# Patient Record
Sex: Male | Born: 1945 | Race: White | Hispanic: No | Smoking: Current every day smoker
Health system: Southern US, Community
[De-identification: ages and names within clinical notes are randomized; demographics above are authoritative.]

## PROBLEM LIST (undated history)

## (undated) DIAGNOSIS — D492 Neoplasm of unspecified behavior of bone, soft tissue, and skin: Secondary | ICD-10-CM

## (undated) DIAGNOSIS — K469 Unspecified abdominal hernia without obstruction or gangrene: Secondary | ICD-10-CM

## (undated) DIAGNOSIS — N19 Unspecified kidney failure: Secondary | ICD-10-CM

## (undated) DIAGNOSIS — F419 Anxiety disorder, unspecified: Secondary | ICD-10-CM

## (undated) DIAGNOSIS — F1010 Alcohol abuse, uncomplicated: Secondary | ICD-10-CM

## (undated) DIAGNOSIS — Z21 Asymptomatic human immunodeficiency virus [HIV] infection status: Secondary | ICD-10-CM

## (undated) DIAGNOSIS — B20 Human immunodeficiency virus [HIV] disease: Secondary | ICD-10-CM

## (undated) DIAGNOSIS — I10 Essential (primary) hypertension: Secondary | ICD-10-CM

## (undated) DIAGNOSIS — R55 Syncope and collapse: Secondary | ICD-10-CM

## (undated) DIAGNOSIS — K4090 Unilateral inguinal hernia, without obstruction or gangrene, not specified as recurrent: Secondary | ICD-10-CM

## (undated) HISTORY — DX: Unspecified kidney failure: N19

## (undated) HISTORY — DX: Syncope and collapse: R55

## (undated) HISTORY — DX: Unspecified abdominal hernia without obstruction or gangrene: K46.9

## (undated) HISTORY — PX: CARPAL TUNNEL RELEASE: SHX101

## (undated) HISTORY — DX: Unilateral inguinal hernia, without obstruction or gangrene, not specified as recurrent: K40.90

## (undated) HISTORY — DX: Human immunodeficiency virus (HIV) disease: B20

## (undated) HISTORY — PX: JOINT REPLACEMENT: SHX530

## (undated) HISTORY — DX: Anxiety disorder, unspecified: F41.9

## (undated) HISTORY — PX: REPLACEMENT TOTAL KNEE: SUR1224

## (undated) HISTORY — DX: Asymptomatic human immunodeficiency virus (hiv) infection status: Z21

## (undated) HISTORY — PX: LAMINECTOMY: SHX219

## (undated) HISTORY — DX: Essential (primary) hypertension: I10

## (undated) HISTORY — DX: Neoplasm of unspecified behavior of bone, soft tissue, and skin: D49.2

---

## 1991-03-05 HISTORY — PX: INGUINAL HERNIA REPAIR: SUR1180

## 1994-03-04 HISTORY — PX: INGUINAL HERNIA REPAIR: SUR1180

## 1995-03-05 HISTORY — PX: UMBILICAL HERNIA REPAIR: SHX196

## 1995-03-05 HISTORY — PX: HERNIA REPAIR: SHX51

## 2003-03-05 HISTORY — PX: HERNIA REPAIR: SHX51

## 2013-05-21 ENCOUNTER — Ambulatory Visit: Payer: Self-pay | Admitting: Orthopedic Surgery

## 2013-07-05 ENCOUNTER — Ambulatory Visit: Payer: Self-pay | Admitting: Family Medicine

## 2013-07-30 DIAGNOSIS — M7521 Bicipital tendinitis, right shoulder: Secondary | ICD-10-CM | POA: Insufficient documentation

## 2013-07-30 DIAGNOSIS — M75102 Unspecified rotator cuff tear or rupture of left shoulder, not specified as traumatic: Secondary | ICD-10-CM | POA: Insufficient documentation

## 2013-08-02 DIAGNOSIS — B20 Human immunodeficiency virus [HIV] disease: Secondary | ICD-10-CM | POA: Insufficient documentation

## 2013-08-02 DIAGNOSIS — K219 Gastro-esophageal reflux disease without esophagitis: Secondary | ICD-10-CM | POA: Insufficient documentation

## 2013-08-02 HISTORY — PX: OPEN ANTERIOR SHOULDER RECONSTRUCTION: SHX2100

## 2013-08-19 DIAGNOSIS — M7541 Impingement syndrome of right shoulder: Secondary | ICD-10-CM | POA: Insufficient documentation

## 2013-12-13 DIAGNOSIS — M179 Osteoarthritis of knee, unspecified: Secondary | ICD-10-CM | POA: Insufficient documentation

## 2013-12-13 DIAGNOSIS — M23222 Derangement of posterior horn of medial meniscus due to old tear or injury, left knee: Secondary | ICD-10-CM | POA: Insufficient documentation

## 2013-12-13 DIAGNOSIS — M1712 Unilateral primary osteoarthritis, left knee: Secondary | ICD-10-CM | POA: Insufficient documentation

## 2013-12-13 DIAGNOSIS — M1710 Unilateral primary osteoarthritis, unspecified knee: Secondary | ICD-10-CM | POA: Insufficient documentation

## 2014-02-15 DIAGNOSIS — F329 Major depressive disorder, single episode, unspecified: Secondary | ICD-10-CM | POA: Insufficient documentation

## 2014-02-15 DIAGNOSIS — F418 Other specified anxiety disorders: Secondary | ICD-10-CM | POA: Insufficient documentation

## 2014-02-15 DIAGNOSIS — F32A Depression, unspecified: Secondary | ICD-10-CM | POA: Insufficient documentation

## 2014-02-15 DIAGNOSIS — F419 Anxiety disorder, unspecified: Secondary | ICD-10-CM

## 2014-02-15 DIAGNOSIS — Z8619 Personal history of other infectious and parasitic diseases: Secondary | ICD-10-CM | POA: Insufficient documentation

## 2014-02-17 DIAGNOSIS — R410 Disorientation, unspecified: Secondary | ICD-10-CM | POA: Insufficient documentation

## 2014-02-17 DIAGNOSIS — D72829 Elevated white blood cell count, unspecified: Secondary | ICD-10-CM | POA: Insufficient documentation

## 2014-02-17 DIAGNOSIS — N179 Acute kidney failure, unspecified: Secondary | ICD-10-CM | POA: Insufficient documentation

## 2014-02-17 DIAGNOSIS — I959 Hypotension, unspecified: Secondary | ICD-10-CM | POA: Insufficient documentation

## 2014-02-17 DIAGNOSIS — R031 Nonspecific low blood-pressure reading: Secondary | ICD-10-CM | POA: Insufficient documentation

## 2014-03-04 ENCOUNTER — Emergency Department: Payer: Self-pay | Admitting: Emergency Medicine

## 2014-03-11 DIAGNOSIS — M545 Low back pain, unspecified: Secondary | ICD-10-CM | POA: Insufficient documentation

## 2014-03-15 ENCOUNTER — Emergency Department: Payer: Self-pay | Admitting: Emergency Medicine

## 2014-04-18 DIAGNOSIS — Z96659 Presence of unspecified artificial knee joint: Secondary | ICD-10-CM | POA: Insufficient documentation

## 2014-04-25 DIAGNOSIS — M4716 Other spondylosis with myelopathy, lumbar region: Secondary | ICD-10-CM | POA: Insufficient documentation

## 2014-04-25 DIAGNOSIS — M47816 Spondylosis without myelopathy or radiculopathy, lumbar region: Secondary | ICD-10-CM | POA: Insufficient documentation

## 2014-04-27 ENCOUNTER — Ambulatory Visit: Payer: Self-pay | Admitting: Internal Medicine

## 2014-04-30 ENCOUNTER — Other Ambulatory Visit: Payer: Self-pay | Admitting: Internal Medicine

## 2014-05-03 ENCOUNTER — Ambulatory Visit
Admit: 2014-05-03 | Disposition: A | Discharge status: Home / Self Care | Payer: Self-pay | Attending: Internal Medicine | Admitting: Internal Medicine

## 2014-05-30 LAB — HEPATIC FUNCTION PANEL A (ARMC)
Albumin: 4.1 g/dL
Alkaline Phosphatase: 171 U/L — ABNORMAL HIGH
Bilirubin, Direct: 0.3 mg/dL
Bilirubin,Total: 2.8 mg/dL — ABNORMAL HIGH
Indirect Bilirubin: 2.5 — ABNORMAL HIGH
SGOT(AST): 18 U/L
SGPT (ALT): 10 U/L — ABNORMAL LOW
Total Protein: 6.9 g/dL

## 2014-05-30 LAB — CREATININE, SERUM
Creatinine: 1.91 mg/dL — ABNORMAL HIGH
EGFR (African American): 41 — ABNORMAL LOW
EGFR (Non-African Amer.): 36 — ABNORMAL LOW

## 2014-05-31 LAB — KAPPA/LAMBDA FREE LIGHT CHAINS (ARMC)

## 2014-06-03 ENCOUNTER — Ambulatory Visit
Admit: 2014-06-03 | Disposition: A | Discharge status: Home / Self Care | Payer: Self-pay | Attending: Internal Medicine | Admitting: Internal Medicine

## 2014-06-03 LAB — PROT IMMUNOELECT,UR-24HR

## 2014-06-13 LAB — CBC CANCER CENTER
Basophil #: 0 x10 3/mm (ref 0.0–0.1)
Basophil %: 0.4 %
Eosinophil #: 0.1 x10 3/mm (ref 0.0–0.7)
Eosinophil %: 2 %
HCT: 35.9 % — ABNORMAL LOW (ref 40.0–52.0)
HGB: 12.4 g/dL — ABNORMAL LOW (ref 13.0–18.0)
Lymphocyte #: 1.9 x10 3/mm (ref 1.0–3.6)
Lymphocyte %: 27.4 %
MCH: 35.2 pg — ABNORMAL HIGH (ref 26.0–34.0)
MCHC: 34.5 g/dL (ref 32.0–36.0)
MCV: 102 fL — ABNORMAL HIGH (ref 80–100)
Monocyte #: 0.6 x10 3/mm (ref 0.2–1.0)
Monocyte %: 9 %
Neutrophil #: 4.2 x10 3/mm (ref 1.4–6.5)
Neutrophil %: 61.2 %
Platelet: 164 x10 3/mm (ref 150–440)
RBC: 3.51 10*6/uL — ABNORMAL LOW (ref 4.40–5.90)
RDW: 13.2 % (ref 11.5–14.5)
WBC: 6.9 x10 3/mm (ref 3.8–10.6)

## 2014-06-13 LAB — BILIRUBIN, DIRECT: Bilirubin, Direct: 0.4 mg/dL

## 2014-06-13 LAB — BILIRUBIN, TOTAL: Bilirubin,Total: 2.1 mg/dL — ABNORMAL HIGH

## 2014-06-17 LAB — PROT IMMUNOELECT,UR-24HR

## 2014-07-26 ENCOUNTER — Other Ambulatory Visit: Payer: Self-pay | Admitting: *Deleted

## 2014-07-26 DIAGNOSIS — D509 Iron deficiency anemia, unspecified: Secondary | ICD-10-CM

## 2014-07-26 DIAGNOSIS — R809 Proteinuria, unspecified: Secondary | ICD-10-CM

## 2014-07-26 DIAGNOSIS — R7989 Other specified abnormal findings of blood chemistry: Secondary | ICD-10-CM

## 2014-07-27 ENCOUNTER — Inpatient Hospital Stay: Payer: Medicare Other | Attending: Internal Medicine | Admitting: Internal Medicine

## 2014-07-27 ENCOUNTER — Other Ambulatory Visit: Payer: Medicare Other

## 2014-08-17 ENCOUNTER — Ambulatory Visit: Payer: Medicare Other | Admitting: Family Medicine

## 2014-08-17 ENCOUNTER — Other Ambulatory Visit: Payer: Medicare Other

## 2014-08-23 ENCOUNTER — Other Ambulatory Visit: Payer: Self-pay

## 2014-08-24 ENCOUNTER — Inpatient Hospital Stay: Payer: Medicare Other

## 2014-08-24 ENCOUNTER — Inpatient Hospital Stay: Payer: Medicare Other | Attending: Internal Medicine | Admitting: Internal Medicine

## 2014-08-24 VITALS — BP 173/99 | HR 75 | Temp 97.4°F | Resp 18 | Ht 70.0 in | Wt 186.3 lb

## 2014-08-24 DIAGNOSIS — D649 Anemia, unspecified: Secondary | ICD-10-CM | POA: Insufficient documentation

## 2014-08-24 DIAGNOSIS — Z21 Asymptomatic human immunodeficiency virus [HIV] infection status: Secondary | ICD-10-CM | POA: Insufficient documentation

## 2014-08-24 DIAGNOSIS — R809 Proteinuria, unspecified: Secondary | ICD-10-CM

## 2014-08-24 DIAGNOSIS — D472 Monoclonal gammopathy: Secondary | ICD-10-CM | POA: Diagnosis not present

## 2014-08-24 DIAGNOSIS — Z79899 Other long term (current) drug therapy: Secondary | ICD-10-CM

## 2014-08-24 DIAGNOSIS — R7989 Other specified abnormal findings of blood chemistry: Secondary | ICD-10-CM

## 2014-08-24 DIAGNOSIS — D509 Iron deficiency anemia, unspecified: Secondary | ICD-10-CM

## 2014-08-24 LAB — IRON AND TIBC
Iron: 151 ug/dL (ref 45–182)
Saturation Ratios: 40 % — ABNORMAL HIGH (ref 17.9–39.5)
TIBC: 379 ug/dL (ref 250–450)
UIBC: 228 ug/dL

## 2014-08-24 LAB — HEPATIC FUNCTION PANEL
ALT: 16 U/L — ABNORMAL LOW (ref 17–63)
AST: 23 U/L (ref 15–41)
Albumin: 4.5 g/dL (ref 3.5–5.0)
Alkaline Phosphatase: 138 U/L — ABNORMAL HIGH (ref 38–126)
Bilirubin, Direct: 0.3 mg/dL (ref 0.1–0.5)
Indirect Bilirubin: 2.5 mg/dL — ABNORMAL HIGH (ref 0.3–0.9)
Total Bilirubin: 2.8 mg/dL — ABNORMAL HIGH (ref 0.3–1.2)
Total Protein: 7.2 g/dL (ref 6.5–8.1)

## 2014-08-24 LAB — CBC WITH DIFFERENTIAL/PLATELET
Basophils Absolute: 0 10*3/uL (ref 0–0.1)
Basophils Relative: 0 %
Eosinophils Absolute: 0.1 10*3/uL (ref 0–0.7)
Eosinophils Relative: 1 %
HCT: 39.3 % — ABNORMAL LOW (ref 40.0–52.0)
Hemoglobin: 13.4 g/dL (ref 13.0–18.0)
Lymphocytes Relative: 19 %
Lymphs Abs: 1.6 10*3/uL (ref 1.0–3.6)
MCH: 34.9 pg — ABNORMAL HIGH (ref 26.0–34.0)
MCHC: 34 g/dL (ref 32.0–36.0)
MCV: 102.4 fL — ABNORMAL HIGH (ref 80.0–100.0)
Monocytes Absolute: 0.8 10*3/uL (ref 0.2–1.0)
Monocytes Relative: 9 %
Neutro Abs: 5.8 10*3/uL (ref 1.4–6.5)
Neutrophils Relative %: 71 %
Platelets: 175 10*3/uL (ref 150–440)
RBC: 3.84 MIL/uL — ABNORMAL LOW (ref 4.40–5.90)
RDW: 13.6 % (ref 11.5–14.5)
WBC: 8.3 10*3/uL (ref 3.8–10.6)

## 2014-08-24 LAB — FERRITIN: Ferritin: 287 ng/mL (ref 24–336)

## 2014-09-05 NOTE — Progress Notes (Signed)
[place]  [phone] [phone]     ID: Tennis Must. OB: 1945/05/14  MR#: [id number]  JYN#:[id number]  No care team member to display  CHIEF COMPLAINT/DIAGNOSIS:  1. Positive low level UPEP, DETECTED DURING W/U OF AZOTEMIA, PROTEINURIA.  ON 04/13/14, SPEP NO SPIKE, CRE 1.7, HGB 11.1, PLTS, WBC, DIFF, NORMAL URINE PROTEIN 167MG /DL, 22% M SPIKE, OR 33MG /DL OF M PROTEIN    April 2016 - 24-hour UIEP negative for M-spike, showed total 464 mg protein. Feb 2016 - SIEP negative for M-spike, serum B12 normal. Mar 2016 - Serum kappa LC 25.78, lambda LC 16.93, kappa/lambda ratio 1.52, stool OB negative x 2, serum iron 61, iron sat 20%. 2. HIV POSITIVE, ON TX, VIRAL LOAD UNDECTECTABLE, ALSO HX HEP , HAS POS ANTIBODY SCREEN, REPORTS PRIOR SUCCESSFULL TX   3. UNCONFIRMED, HE RECALLS BEING TOLD B12 LOW AT ONE TIME, BUT B12 LEVEL NORMAL 2/24   4. ANEMIA, LIKELY AZOTEMIA, COOMBS NEG ALSO DRUG EFFECT. BORDERLINE LOW IRON SAT.   HISTORY OF PRESENT ILLNESS:  Patient returns for continued hematology followup, last seen about 3-4 months ago. Seen by me in absence of Dr. Inez Pilgrim. States he is doing about the same. Eating steady, denies weight loss. Denies fevers or night sweats. Denies feeling lymph nodes on self-exam. Has chronic low back pain, also chronic knee and shoulder pain issues are the same, fluctuates 2 to 8/10. States he has tingling in both hands intermittently and is planned for MRI neck evaluation. Denies new bone pains. No new headaches, focal weakness or paresthesias in extremities.  REVIEW OF SYSTEMS:   ROS As in HPI above. In addition, no fever, chills or sweats. No new headaches or focal weakness.  No new sore throator dysphagia. No new abdominal pain, constipation, diarrhea, dysuria or hematuria. No new skin rash or bleeding symptoms. No new paresthesias in extremities. No polyuria polydipsia.  PAST MEDICAL HISTORY: Reviewed. PVD GERD LUMBAR  LAMINECTOMY Kidney Stones:    Abnormal Heart Rhythm:    Depression:    Anxiety:    Arthritis:    HIV:    Right and Left Knee Replacement:    Hernia Repair: x2   Leg Surgery - Right:  PAST SURGICAL HISTORY: Reviewed. As above  FAMILY HISTORY: Reviewed. Negative for hematological disorders.  ADVANCED DIRECTIVES:  <no information>  SOCIAL HISTORY: Reviewed. Ex-smoker, quit in Christmas of 2014 after smoking for 15 years. Has h/o alcohol intake, 1-2 drinks per day. Prior usage of marijuana and cocaine.   No Known Allergies  No current outpatient prescriptions on file.   No current facility-administered medications for this visit.    PHYSICAL EXAM: Filed Vitals:   08/24/14 1131  BP: 173/99  Pulse:   Temp:   Resp:      Body mass index is 26.73 kg/(m^2).       GENERAL: Patient is alert and oriented and in no acute distress. There is no icterus. HEENT: EOMs intact. Oral exam negative for thrush or lesions. No cervical lymphadenopathy. LUNGS: Bilaterally clear to auscultation, no rhonchi. ABDOMEN: Soft, nontender. No hepatosplenomegaly clinically.  EXTREMITIES: No pedal edema. LYMPHATICS: No palpable adenopathy in axillary or inguinal areas.   LAB RESULTS:    Component Value Date/Time   CREATININE 1.91* 05/30/2014 1001   PROT 7.2 08/24/2014 1044   PROT 6.9 05/30/2014 1001   ALBUMIN 4.5 08/24/2014 1044   ALBUMIN 4.1 05/30/2014 1001   AST 23 08/24/2014 1044   AST 18 05/30/2014 1001   ALT  16* 08/24/2014 1044   ALT 10* 05/30/2014 1001   ALKPHOS 138* 08/24/2014 1044   ALKPHOS 171* 05/30/2014 1001   BILITOT 2.8* 08/24/2014 1044   GFRNONAA 36* 05/30/2014 1001   GFRAA 41* 05/30/2014 1001   Lab Results  Component Value Date   WBC 8.3 08/24/2014   NEUTROABS 5.8 08/24/2014   HGB 13.4 08/24/2014   HCT 39.3* 08/24/2014   MCV 102.4* 08/24/2014   PLT 175 08/24/2014    STUDIES: April 2016 - 24-hour UIEP negative for M-spike, showed total 464 mg protein. Feb  2016 - SIEP negative for M-spike, serum B12 normal. Mar 2016 - Serum kappa LC 25.78, lambda LC 16.93, kappa/lambda ratio 1.52, stool OB negative x 2, serum iron 61, iron sat 20%.   ASSESSMENT / PLAN:   1. Positive low level UPEP, DETECTED DURING W/U OF AZOTEMIA, PROTEINURIA.  ON 04/13/14, SPEP NO SPIKE, CRE 1.7, HGB 11.1, PLTS, WBC, DIFF, NORMAL URINE PROTEIN 167MG /DL, 22% M SPIKE, OR 33MG /DL OF M PROTEIN. April 2016 - 24-hour UIEP negative for M-spike, showed total 464 mg protein. Feb 2016 - SIEP negative for M-spike, serum B12 normal. Mar 2016 - Serum kappa LC 25.78, lambda LC 16.93, kappa/lambda ratio 1.52, stool OB negative x 2, serum iron 61, iron sat 20% -  Reviewed recent labs and d/w patient. Recent SIEP and 24-hour UIEP negative for M-protein. 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 Plan is continued monitoring, will get Lab at 24 weeks, next MD f/u at 48 weeks with labs and make further plan of management.. 2. HIV POSITIVE, ON TX, VIRAL LOAD UNDECTECTABLE, ALSO HX HEP , HAS POS ANTIBODY SCREEN - states he is following with ID expert. 3. ANEMIA, LIKELY AZOTEMIA, COOMBS NEG ALSO DRUG EFFECT. BORDERLINE LOW IRON SAT - recent w/u earlier this year mostly unremarkable. Hb normalized today, will monitor.  4.In between visits, patient advised to call or come to ER in case of any fevers, bleeding, new symptoms or acute sickness. He is agreeable to this plan.     Leia Alf, MD   09/05/2014 2:23 PM

## 2014-09-15 ENCOUNTER — Other Ambulatory Visit: Payer: Self-pay | Admitting: Family Medicine

## 2014-09-15 DIAGNOSIS — M79642 Pain in left hand: Secondary | ICD-10-CM

## 2014-09-21 ENCOUNTER — Ambulatory Visit: Payer: Medicare Other

## 2014-09-23 ENCOUNTER — Telehealth (HOSPITAL_COMMUNITY): Payer: Self-pay | Admitting: Family Medicine

## 2014-09-26 ENCOUNTER — Ambulatory Visit: Payer: Medicare Other

## 2014-10-03 ENCOUNTER — Ambulatory Visit
Admission: RE | Admit: 2014-10-03 | Discharge: 2014-10-03 | Disposition: A | Discharge status: Home / Self Care | Payer: Medicare Other | Source: Ambulatory Visit | Attending: Family Medicine | Admitting: Family Medicine

## 2014-10-03 DIAGNOSIS — Z87898 Personal history of other specified conditions: Secondary | ICD-10-CM | POA: Insufficient documentation

## 2014-10-03 DIAGNOSIS — M4802 Spinal stenosis, cervical region: Secondary | ICD-10-CM | POA: Diagnosis not present

## 2014-10-03 DIAGNOSIS — Z85828 Personal history of other malignant neoplasm of skin: Secondary | ICD-10-CM | POA: Insufficient documentation

## 2014-10-03 DIAGNOSIS — M79642 Pain in left hand: Secondary | ICD-10-CM

## 2014-10-03 DIAGNOSIS — G542 Cervical root disorders, not elsewhere classified: Secondary | ICD-10-CM | POA: Insufficient documentation

## 2015-01-16 ENCOUNTER — Telehealth: Payer: Self-pay

## 2015-01-16 NOTE — Telephone Encounter (Signed)
Patient walked in this am and states that he was just seen by Dr. Mack Guise (Ortho) due to right hip pain and he told him it was due to a right inguinal hernia that the patient reports having more than a year. He would like to get this fixed. His PCP is [facility]. I informed patient that I would need to get records sent over from PCP and then we would be happy to place him on the schedule.   I will call patient to schedule him when records have arrived. (Patient's Phone - [phone])  Called Dr. Hardin Negus office. [phone]. [facility]). Both voicemail boxes reached at clinic were full. Will try back.

## 2015-01-17 NOTE — Telephone Encounter (Signed)
Called Dr. Hardin Negus office once again. Spoke with Benjamine Mola and asked for records. She will fax over records. As soon as I get records, I can call patient to schedule.

## 2015-01-18 ENCOUNTER — Other Ambulatory Visit: Payer: Self-pay | Admitting: Orthopedic Surgery

## 2015-01-18 DIAGNOSIS — M5416 Radiculopathy, lumbar region: Secondary | ICD-10-CM

## 2015-01-19 NOTE — Telephone Encounter (Signed)
Right Inguinal Hernia repaired with mesh in [place], [place] 30 years ago, Umbilical Hernia was repaired in 1997 in [facility], [place]. Supraumbilical hernia repair in 2005, [place].  Left lower abdomen ventral Hernia repaired [place], [place] 1997. Left inguinal hernia repair 1996 [place], [place].   Currently has a ventral hernia that is asymptomatic at this time, has had 5 years- never been a problem.   Right inguinal hernia has recurred 6 months ago and patient is now having increased pain.  Scheduled for MRI on Right Hip on 02/08/15.

## 2015-01-19 NOTE — Telephone Encounter (Signed)
Called patient at this time to discuss history received. Pt has history of umbilical and inguinal hernia repair. Need more information on these surgeries prior to scheduling with a surgeon. No answer when called. Will await a call back.

## 2015-02-01 ENCOUNTER — Encounter: Payer: Self-pay | Admitting: Surgery

## 2015-02-01 ENCOUNTER — Ambulatory Visit (INDEPENDENT_AMBULATORY_CARE_PROVIDER_SITE_OTHER): Payer: Medicare Other | Admitting: Surgery

## 2015-02-01 VITALS — BP 149/93 | HR 80 | Temp 98.1°F | Ht 70.0 in | Wt 195.0 lb

## 2015-02-01 DIAGNOSIS — K4090 Unilateral inguinal hernia, without obstruction or gangrene, not specified as recurrent: Secondary | ICD-10-CM | POA: Diagnosis not present

## 2015-02-01 DIAGNOSIS — M25551 Pain in right hip: Secondary | ICD-10-CM | POA: Insufficient documentation

## 2015-02-01 NOTE — Patient Instructions (Signed)
For your pelvic pain you will need to put ice packs and take Ibuprofen or Tylenol. You need to rest. Can not work until you feel better. This will take 12 weeks until you feel better.  After 12 weeks of resting and you have pain on your groin are, please give [place] a call so we could see you and evaluate you again.

## 2015-02-01 NOTE — Progress Notes (Signed)
Subjective:     Patient ID: Anthony Must., male   DOB: 04-17-1945, 69 y.o.   MRN: [id number]  HPI  69 yr old with Hx of umbilical, ventral, and bilateral inguinal hernia repairs years prior as well as arthritis and hips pain comes in with concern for possible recurrent right inguinal hernia.  Patient states that a few weeks ago he was lifting a ladder around and had severe pain in the right groin afterward.  He states that he took it easy for a few days and pain improved but states once going back to activities he has been hurting again.  He states it hurts to walk, and that the pain radiates down to just above the knee.  He denies any fever, chills, nausea, vomiting, SOB, chest pain, abdominal pain, constipation, diarrhea or dysuria.   Past Medical History  Diagnosis Date  . HIV infection ([place])     Sees Dr. Ola Spurr for this  . Hernia, abdominal   . Inguinal hernia   . Kidney failure   . Neoplasm of skin    Past Surgical History  Procedure Laterality Date  . Laminectomy    . Replacement total knee Right   . Replacement total knee Left     [facility]  . Umbilical hernia repair  1997    [place], [place]  . Open anterior shoulder reconstruction Right 08/2013    [facility]  . Carpal tunnel release Left   . Inguinal hernia repair Left 1996    [place]  . Inguinal hernia repair Right 1993    [place]  . Hernia repair  1997    Left Lower Abdomen- [place]  . Hernia repair  [id number]    Supraumbilical- [place]   Family History  Problem Relation Age of Onset  . Diabetes Mother   . CAD Father 76   Social History   Social History  . Marital Status: Single    Spouse Name: N/A  . Number of Children: N/A  . Years of Education: N/A   Social History Main Topics  . Smoking status: Former Smoker    Quit date: 06/18/2013  . Smokeless tobacco: Never Used  . Alcohol Use: 0.0 oz/week    0 Standard drinks or equivalent per week     Comment: Every other day  . Drug Use: No     Comment: Use  to do cocaine 30 years ago  . Sexual Activity: Not Asked   Other Topics Concern  . None   Social History Narrative    Current outpatient prescriptions:  .  abacavir (ZIAGEN) 300 MG tablet, Take 1 tablet by mouth 2 (two) times daily., Disp: , Rfl:  .  acetaminophen (TYLENOL) 500 MG tablet, Take 1,000 mg by mouth every 6 (six) hours as needed., Disp: , Rfl:  .  atazanavir (REYATAZ) 200 MG capsule, Take 1 capsule by mouth 2 (two) times daily., Disp: , Rfl:  .  buPROPion (WELLBUTRIN SR) 150 MG 12 hr tablet, Take 1 tablet by mouth 2 (two) times daily., Disp: , Rfl:  .  clindamycin (CLEOCIN T) 1 % lotion, Apply 1 application topically 1 day or 1 dose., Disp: , Rfl:  .  DESCOVY 200-25 MG tablet, Take 1 tablet by mouth 1 day or 1 dose., Disp: , Rfl:  .  diclofenac sodium (VOLTAREN) 1 % GEL, Apply 2 g topically 4 (four) times daily., Disp: , Rfl:  .  Diphenhyd-Lidocaine-Nystatin (FIRST-BXN MOUTHWASH) SUSP, Use as directed 10 mLs in the mouth  or throat every 6 (six) hours as needed., Disp: , Rfl:  .  ketoconazole (NIZORAL) 2 % shampoo, Apply 1 application topically 1 day or 1 dose., Disp: , Rfl:  .  lamivudine (EPIVIR) 100 MG tablet, Take 100 mg by mouth daily., Disp: , Rfl:  .  ranitidine (ZANTAC) 150 MG capsule, Take 150 mg by mouth 2 (two) times daily., Disp: , Rfl:  No Known Allergies     Review of Systems  Constitutional: Negative for fever, chills, activity change and fatigue.  HENT: Negative for congestion and nosebleeds.   Respiratory: Negative for cough, chest tightness, shortness of breath and wheezing.   Cardiovascular: Negative for chest pain, palpitations and leg swelling.  Gastrointestinal: Negative for nausea, vomiting, abdominal pain, diarrhea, constipation and blood in stool.       Right groin pain  Genitourinary: Negative for dysuria, hematuria, penile pain and testicular pain.  Musculoskeletal: Positive for back pain, joint swelling and gait problem.  Skin: Negative for  color change, pallor, rash and wound.  Neurological: Negative for dizziness, seizures and weakness.  Hematological: Negative for adenopathy. Does not bruise/bleed easily.  Psychiatric/Behavioral: Negative for agitation. The patient is not nervous/anxious.   All other systems reviewed and are negative.      Objective:   Physical Exam  Constitutional: He is oriented to person, place, and time. He appears well-developed and well-nourished. No distress.  HENT:  Head: Normocephalic and atraumatic.  Right Ear: External ear normal.  Left Ear: External ear normal.  Nose: Nose normal.  Mouth/Throat: Oropharynx is clear and moist. No oropharyngeal exudate.  Eyes: Conjunctivae are normal. Pupils are equal, round, and reactive to light. No scleral icterus.  Neck: Normal range of motion. Neck supple. No tracheal deviation present.  Cardiovascular: Normal rate, regular rhythm, normal heart sounds and intact distal pulses.  Exam reveals no gallop and no friction rub.   No murmur heard. Pulmonary/Chest: Effort normal and breath sounds normal. No respiratory distress. He has no wheezes. He has no rales.  Abdominal: Soft. Bowel sounds are normal. He exhibits no distension. There is no tenderness. There is no rebound.  Genitourinary: Penis normal. No penile tenderness.  Right groin: small recurrent hernia, no incarceration, intense pain at pubis and along inguinal ligament   Left groin: non-tender, no hernia palpated  Scrotum: normal bilateral skin, no lesions, bilateral testicles present  Musculoskeletal: Normal range of motion. He exhibits tenderness. He exhibits no edema.  Intense pain along right inguinal ligament and pubic bone even with light palpation, as well intense pain with lifting leg but all more lateral than groin  Neurological: He is alert and oriented to person, place, and time.  Skin: Skin is warm and dry. No rash noted. No erythema. No pallor.  Psychiatric: He has a normal mood and  affect. His behavior is normal. Judgment and thought content normal.  Vitals reviewed.      Assessment:     69 yr old with Right inguinal pain and small recurrent hernia     Plan:     Given the level of pain even with very light palpation and more lateral location, appears to musculoskeletal pain from muscle sprain or tear along right inguinal ligament and pubis.  He does have a small hernia but not incarcerated and tenderness more lateral that hernia site.  Instructed that muscular pain continues for about 12 weeks.  That he will need to refrain from strenuous activities, use ice packs to the area multiple times a day and  use tylenol and NSAIDs to decrease the inflammation.  Patient understands.  Will have him return in about 2 months to see if pain resolved and to recheck hernia at that time.

## 2015-02-08 ENCOUNTER — Inpatient Hospital Stay: Payer: Medicare Other | Attending: Internal Medicine

## 2015-02-08 ENCOUNTER — Ambulatory Visit
Admission: RE | Admit: 2015-02-08 | Discharge: 2015-02-08 | Disposition: A | Discharge status: Home / Self Care | Payer: Medicare Other | Source: Ambulatory Visit | Attending: Orthopedic Surgery | Admitting: Orthopedic Surgery

## 2015-02-08 DIAGNOSIS — M47896 Other spondylosis, lumbar region: Secondary | ICD-10-CM | POA: Insufficient documentation

## 2015-02-08 DIAGNOSIS — M4806 Spinal stenosis, lumbar region: Secondary | ICD-10-CM | POA: Diagnosis not present

## 2015-02-08 DIAGNOSIS — D649 Anemia, unspecified: Secondary | ICD-10-CM | POA: Insufficient documentation

## 2015-02-08 DIAGNOSIS — M5416 Radiculopathy, lumbar region: Secondary | ICD-10-CM | POA: Insufficient documentation

## 2015-02-08 DIAGNOSIS — D472 Monoclonal gammopathy: Secondary | ICD-10-CM | POA: Insufficient documentation

## 2015-02-08 LAB — CBC WITH DIFFERENTIAL/PLATELET
Basophils Absolute: 0.1 10*3/uL (ref 0–0.1)
Basophils Relative: 1 %
Eosinophils Absolute: 0.2 10*3/uL (ref 0–0.7)
Eosinophils Relative: 2 %
HCT: 43.3 % (ref 40.0–52.0)
Hemoglobin: 15 g/dL (ref 13.0–18.0)
Lymphocytes Relative: 25 %
Lymphs Abs: 2 10*3/uL (ref 1.0–3.6)
MCH: 34.9 pg — ABNORMAL HIGH (ref 26.0–34.0)
MCHC: 34.6 g/dL (ref 32.0–36.0)
MCV: 100.8 fL — ABNORMAL HIGH (ref 80.0–100.0)
Monocytes Absolute: 0.8 10*3/uL (ref 0.2–1.0)
Monocytes Relative: 9 %
Neutro Abs: 5.2 10*3/uL (ref 1.4–6.5)
Neutrophils Relative %: 63 %
Platelets: 202 10*3/uL (ref 150–440)
RBC: 4.29 MIL/uL — ABNORMAL LOW (ref 4.40–5.90)
RDW: 13.2 % (ref 11.5–14.5)
WBC: 8.2 10*3/uL (ref 3.8–10.6)

## 2015-02-08 LAB — CREATININE, SERUM
Creatinine, Ser: 1.77 mg/dL — ABNORMAL HIGH (ref 0.61–1.24)
GFR calc Af Amer: 44 mL/min — ABNORMAL LOW (ref >60)
GFR calc non Af Amer: 38 mL/min — ABNORMAL LOW (ref >60)

## 2015-02-08 LAB — CALCIUM: Calcium: 8.8 mg/dL — ABNORMAL LOW (ref 8.9–10.3)

## 2015-02-10 LAB — PROTEIN ELECTROPHORESIS, SERUM
A/G Ratio: 1.4 (ref 0.7–1.7)
Albumin ELP: 4 g/dL (ref 2.9–4.4)
Alpha-1-Globulin: 0.2 g/dL (ref 0.0–0.4)
Alpha-2-Globulin: 0.8 g/dL (ref 0.4–1.0)
Beta Globulin: 1.1 g/dL (ref 0.7–1.3)
Gamma Globulin: 0.7 g/dL (ref 0.4–1.8)
Globulin, Total: 2.8 g/dL (ref 2.2–3.9)
Total Protein ELP: 6.8 g/dL (ref 6.0–8.5)

## 2015-03-20 DIAGNOSIS — N9989 Other postprocedural complications and disorders of genitourinary system: Secondary | ICD-10-CM | POA: Insufficient documentation

## 2015-03-20 DIAGNOSIS — L219 Seborrheic dermatitis, unspecified: Secondary | ICD-10-CM | POA: Insufficient documentation

## 2015-03-20 DIAGNOSIS — I10 Essential (primary) hypertension: Secondary | ICD-10-CM | POA: Insufficient documentation

## 2015-03-20 DIAGNOSIS — N289 Disorder of kidney and ureter, unspecified: Secondary | ICD-10-CM | POA: Insufficient documentation

## 2015-03-20 DIAGNOSIS — D7589 Other specified diseases of blood and blood-forming organs: Secondary | ICD-10-CM | POA: Insufficient documentation

## 2015-03-20 DIAGNOSIS — I739 Peripheral vascular disease, unspecified: Secondary | ICD-10-CM | POA: Insufficient documentation

## 2015-03-20 DIAGNOSIS — G629 Polyneuropathy, unspecified: Secondary | ICD-10-CM | POA: Insufficient documentation

## 2015-07-05 ENCOUNTER — Telehealth: Payer: Self-pay | Admitting: *Deleted

## 2015-07-05 DIAGNOSIS — D472 Monoclonal gammopathy: Secondary | ICD-10-CM

## 2015-07-05 NOTE — Telephone Encounter (Signed)
Spoke with Dr. Jacinto Reap. V/o to have pt come this week for labs and will see patient next week for results.  RN spoke with patient. He is agreeable to come tomorrow at 1045 am for labs and he will be placed on md schedule on 07/14/15 at 1330pm for results.  msg sent to cancer center scheduling to arrange these appointment times.  I spoke with Richarda Osmond, coordinator at [facility].  Once hem. Clearance is obtained, she asks that we forward the clearance form/Dr. B's note back to her orthopedic office.

## 2015-07-05 NOTE — Telephone Encounter (Signed)
[facility] is requesting urgent hematology clearance.  Scheduled for R total knee on 08/03/15.  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  Pt has a f/u apt with Dr. Rogue Bussing on 07/26/15.  Form faxed for md signature.  Dr. Jacinto Reap, please advise, do you want to see patient sooner than 5/24.

## 2015-07-06 ENCOUNTER — Inpatient Hospital Stay: Payer: Medicare Other | Attending: Internal Medicine

## 2015-07-06 DIAGNOSIS — Z79899 Other long term (current) drug therapy: Secondary | ICD-10-CM | POA: Diagnosis not present

## 2015-07-06 DIAGNOSIS — D472 Monoclonal gammopathy: Secondary | ICD-10-CM | POA: Diagnosis not present

## 2015-07-06 DIAGNOSIS — D696 Thrombocytopenia, unspecified: Secondary | ICD-10-CM | POA: Insufficient documentation

## 2015-07-06 DIAGNOSIS — Z87891 Personal history of nicotine dependence: Secondary | ICD-10-CM | POA: Insufficient documentation

## 2015-07-06 DIAGNOSIS — N189 Chronic kidney disease, unspecified: Secondary | ICD-10-CM | POA: Diagnosis not present

## 2015-07-06 DIAGNOSIS — B20 Human immunodeficiency virus [HIV] disease: Secondary | ICD-10-CM | POA: Diagnosis not present

## 2015-07-06 LAB — CBC WITH DIFFERENTIAL/PLATELET
Basophils Absolute: 0 10*3/uL (ref 0–0.1)
Basophils Relative: 0 %
Eosinophils Absolute: 0.1 10*3/uL (ref 0–0.7)
Eosinophils Relative: 1 %
HCT: 44.1 % (ref 40.0–52.0)
Hemoglobin: 15.6 g/dL (ref 13.0–18.0)
Lymphocytes Relative: 22 %
Lymphs Abs: 1.8 10*3/uL (ref 1.0–3.6)
MCH: 35.9 pg — ABNORMAL HIGH (ref 26.0–34.0)
MCHC: 35.3 g/dL (ref 32.0–36.0)
MCV: 101.6 fL — ABNORMAL HIGH (ref 80.0–100.0)
Monocytes Absolute: 0.7 10*3/uL (ref 0.2–1.0)
Monocytes Relative: 9 %
Neutro Abs: 5.4 10*3/uL (ref 1.4–6.5)
Neutrophils Relative %: 68 %
Platelets: 147 10*3/uL — ABNORMAL LOW (ref 150–440)
RBC: 4.34 MIL/uL — ABNORMAL LOW (ref 4.40–5.90)
RDW: 14.1 % (ref 11.5–14.5)
WBC: 8.1 10*3/uL (ref 3.8–10.6)

## 2015-07-06 LAB — COMPREHENSIVE METABOLIC PANEL
ALT: 18 U/L (ref 17–63)
AST: 33 U/L (ref 15–41)
Albumin: 4.4 g/dL (ref 3.5–5.0)
Alkaline Phosphatase: 166 U/L — ABNORMAL HIGH (ref 38–126)
Anion gap: 10 (ref 5–15)
BUN: 23 mg/dL — ABNORMAL HIGH (ref 6–20)
CO2: 23 mmol/L (ref 22–32)
Calcium: 9.2 mg/dL (ref 8.9–10.3)
Chloride: 103 mmol/L (ref 101–111)
Creatinine, Ser: 1.95 mg/dL — ABNORMAL HIGH (ref 0.61–1.24)
GFR calc Af Amer: 39 mL/min — ABNORMAL LOW (ref >60)
GFR calc non Af Amer: 34 mL/min — ABNORMAL LOW (ref >60)
Glucose, Bld: 108 mg/dL — ABNORMAL HIGH (ref 65–99)
Potassium: 3.7 mmol/L (ref 3.5–5.1)
Sodium: 136 mmol/L (ref 135–145)
Total Bilirubin: 1.5 mg/dL — ABNORMAL HIGH (ref 0.3–1.2)
Total Protein: 7.2 g/dL (ref 6.5–8.1)

## 2015-07-07 LAB — KAPPA/LAMBDA LIGHT CHAINS
Kappa free light chain: 26.71 mg/L — ABNORMAL HIGH (ref 3.30–19.40)
Kappa, lambda light chain ratio: 1.37 (ref 0.26–1.65)
Lambda free light chains: 19.53 mg/L (ref 5.71–26.30)

## 2015-07-10 LAB — PROTEIN ELECTROPHORESIS, SERUM
A/G Ratio: 1.5 (ref 0.7–1.7)
Albumin ELP: 3.9 g/dL (ref 2.9–4.4)
Alpha-1-Globulin: 0.2 g/dL (ref 0.0–0.4)
Alpha-2-Globulin: 0.7 g/dL (ref 0.4–1.0)
Beta Globulin: 1 g/dL (ref 0.7–1.3)
Gamma Globulin: 0.7 g/dL (ref 0.4–1.8)
Globulin, Total: 2.6 g/dL (ref 2.2–3.9)
Total Protein ELP: 6.5 g/dL (ref 6.0–8.5)

## 2015-07-14 ENCOUNTER — Inpatient Hospital Stay (HOSPITAL_BASED_OUTPATIENT_CLINIC_OR_DEPARTMENT_OTHER): Payer: Medicare Other | Admitting: Internal Medicine

## 2015-07-14 VITALS — BP 129/79 | HR 72 | Temp 96.5°F | Resp 18 | Wt 196.7 lb

## 2015-07-14 DIAGNOSIS — D472 Monoclonal gammopathy: Secondary | ICD-10-CM | POA: Diagnosis not present

## 2015-07-14 DIAGNOSIS — B20 Human immunodeficiency virus [HIV] disease: Secondary | ICD-10-CM

## 2015-07-14 DIAGNOSIS — D696 Thrombocytopenia, unspecified: Secondary | ICD-10-CM

## 2015-07-14 DIAGNOSIS — Z87891 Personal history of nicotine dependence: Secondary | ICD-10-CM

## 2015-07-14 DIAGNOSIS — N189 Chronic kidney disease, unspecified: Secondary | ICD-10-CM

## 2015-07-14 DIAGNOSIS — Z79899 Other long term (current) drug therapy: Secondary | ICD-10-CM

## 2015-07-14 NOTE — Progress Notes (Signed)
[facility] OFFICE PROGRESS NOTE  Patient Care Team: Theotis Burrow, MD as PCP - General (Family Medicine)   SUMMARY OF HEMATOLOGIC/ONCOLOGIC HISTORY:  # FEB 2016- positive low level UPEP;April 2016- 24 h UPEP- Neg; SIEP/ K-L ratio=N  # HIV- undetectable [Dr.Fitzgerald]; Hx HepC [s/p treatment]; CKD [creat 1.7- 1.9]  INTERVAL HISTORY:  This is my first interaction with the patient since I joined the practice September 2016. I reviewed the patient's prior charts/pertinent labs/imaging in detail; findings are summarized above.   70 year old male patient with a history of low level UPEP/MGUS noted in February 2016- currently awaiting a right knee surgery. He has been referred to [place] for hematology clearance.  Patient is chronic joint pains which is not new. Denies any back pain. Denies any unusual weight loss; a loss of appetite. Denies any shortness of breath cough or chest pain.   REVIEW OF SYSTEMS:  A complete 10 point review of system is done which is negative except mentioned above/history of present illness.   PAST MEDICAL HISTORY :  Past Medical History  Diagnosis Date  . HIV infection ([place])     Sees Dr. Ola Spurr for this  . Hernia, abdominal   . Inguinal hernia   . Kidney failure   . Neoplasm of skin     PAST SURGICAL HISTORY :   Past Surgical History  Procedure Laterality Date  . Laminectomy    . Replacement total knee Right   . Replacement total knee Left     [facility]  . Umbilical hernia repair  1997    [place]  . Open anterior shoulder reconstruction Right 08/2013    [facility]  . Carpal tunnel release Left   . Inguinal hernia repair Left 1996    [place]  . Inguinal hernia repair Right 1993    [place]  . Hernia repair  1997    Left Lower Abdomen- [place]  . Hernia repair  [id number]    Supraumbilical- [place]    FAMILY HISTORY :   Family History  Problem Relation Age of Onset  . Diabetes Mother   . CAD Father 27    SOCIAL  HISTORY:   Social History  Substance Use Topics  . Smoking status: Former Smoker    Quit date: 06/18/2013  . Smokeless tobacco: Never Used  . Alcohol Use: 0.0 oz/week    0 Standard drinks or equivalent per week     Comment: Every other day    ALLERGIES:  has No Known Allergies.  MEDICATIONS:  Current Outpatient Prescriptions  Medication Sig Dispense Refill  . atazanavir (REYATAZ) 200 MG capsule Take 1 capsule by mouth 2 (two) times daily.    [place] Kitchen buPROPion (WELLBUTRIN SR) 150 MG 12 hr tablet Take 1 tablet by mouth 2 (two) times daily.    . clindamycin (CLEOCIN T) 1 % lotion Apply 1 application topically 1 day or 1 dose.    . DESCOVY 200-25 MG tablet Take 1 tablet by mouth 1 day or 1 dose.    [place] Kitchen ketoconazole (NIZORAL) 2 % shampoo Apply 1 application topically 1 day or 1 dose.    . ranitidine (ZANTAC) 150 MG capsule Take 150 mg by mouth 2 (two) times daily.    . diclofenac sodium (VOLTAREN) 1 % GEL Apply 2 g topically 4 (four) times daily. Reported on 07/14/2015     No current facility-administered medications for this visit.    PHYSICAL EXAMINATION:   BP 129/79 mmHg  Pulse 72  Temp(Src) 96.5 F (35.8 C) (Tympanic)  Resp 18  Wt 196 lb 10.4 oz (89.2 kg)  Filed Weights   07/14/15 1349  Weight: 196 lb 10.4 oz (89.2 kg)    GENERAL: Well-nourished well-developed; Alert, no distress and comfortable.   Alone. It is walking with a cane. ES: no pallor or icterus OROPHARYNX: no thrush or ulceration; good dentition  NECK: supple, no masses felt LYMPH:  no palpable lymphadenopathy in the cervical, axillary or inguinal regions LUNGS: clear to auscultation and  No wheeze or crackles HEART/CVS: regular rate & rhythm and no murmurs; No lower extremity edema ABDOMEN:abdomen soft, non-tender and normal bowel sounds Musculoskeletal:no cyanosis of digits and no clubbing  PSYCH: alert & oriented x 3 with fluent speech NEURO: no focal motor/sensory deficits SKIN:  no rashes or significant  lesions  LABORATORY DATA:  I have reviewed the data as listed    Component Value Date/Time   NA 136 07/06/2015 1035   K 3.7 07/06/2015 1035   CL 103 07/06/2015 1035   CO2 23 07/06/2015 1035   GLUCOSE 108* 07/06/2015 1035   BUN 23* 07/06/2015 1035   CREATININE 1.95* 07/06/2015 1035   CREATININE 1.91* 05/30/2014 1001   CALCIUM 9.2 07/06/2015 1035   PROT 7.2 07/06/2015 1035   PROT 6.9 05/30/2014 1001   ALBUMIN 4.4 07/06/2015 1035   ALBUMIN 4.1 05/30/2014 1001   AST 33 07/06/2015 1035   AST 18 05/30/2014 1001   ALT 18 07/06/2015 1035   ALT 10* 05/30/2014 1001   ALKPHOS 166* 07/06/2015 1035   ALKPHOS 171* 05/30/2014 1001   BILITOT 1.5* 07/06/2015 1035   BILITOT 2.1* 06/13/2014 1407   GFRNONAA 34* 07/06/2015 1035   GFRNONAA 36* 05/30/2014 1001   GFRAA 39* 07/06/2015 1035   GFRAA 41* 05/30/2014 1001    No results found for: SPEP, UPEP  Lab Results  Component Value Date   WBC 8.1 07/06/2015   NEUTROABS 5.4 07/06/2015   HGB 15.6 07/06/2015   HCT 44.1 07/06/2015   MCV 101.6* 07/06/2015   PLT 147* 07/06/2015      Chemistry      Component Value Date/Time   NA 136 07/06/2015 1035   K 3.7 07/06/2015 1035   CL 103 07/06/2015 1035   CO2 23 07/06/2015 1035   BUN 23* 07/06/2015 1035   CREATININE 1.95* 07/06/2015 1035   CREATININE 1.91* 05/30/2014 1001      Component Value Date/Time   CALCIUM 9.2 07/06/2015 1035   ALKPHOS 166* 07/06/2015 1035   ALKPHOS 171* 05/30/2014 1001   AST 33 07/06/2015 1035   AST 18 05/30/2014 1001   ALT 18 07/06/2015 1035   ALT 10* 05/30/2014 1001   BILITOT 1.5* 07/06/2015 1035   BILITOT 2.1* 06/13/2014 1407        ASSESSMENT & PLAN:   # 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 Repeat workup negative for M spike or urine protein. Today I lambda light chain normal; SPEP negative. White count hemoglobin normal.  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 Unclear etiology. Monitor for now.  # Chronic kidney disease- 1.9.  Patient does not have nephrologist. Defer to PCP.  # Patient is okay from hematology standpoint to proceed with surgery. He'll follow-up with me in approximately one year; and if still no M protein noted/platelets normalized- he could be discharged from the clinic.   All questions were answered. The patient knows to call the clinic with any problems, questions or concerns.      Lenetta Quaker  Ann Lions, MD 07/14/2015 1:56 PM

## 2015-07-26 ENCOUNTER — Ambulatory Visit: Payer: Medicare Other | Admitting: Internal Medicine

## 2015-07-26 ENCOUNTER — Other Ambulatory Visit: Payer: Medicare Other

## 2016-07-12 ENCOUNTER — Inpatient Hospital Stay: Payer: Medicare Other | Attending: Internal Medicine

## 2016-07-12 DIAGNOSIS — D696 Thrombocytopenia, unspecified: Secondary | ICD-10-CM | POA: Insufficient documentation

## 2016-07-12 DIAGNOSIS — B20 Human immunodeficiency virus [HIV] disease: Secondary | ICD-10-CM | POA: Diagnosis not present

## 2016-07-12 DIAGNOSIS — N183 Chronic kidney disease, stage 3 (moderate): Secondary | ICD-10-CM | POA: Diagnosis not present

## 2016-07-12 DIAGNOSIS — Z87891 Personal history of nicotine dependence: Secondary | ICD-10-CM | POA: Insufficient documentation

## 2016-07-12 DIAGNOSIS — Z79899 Other long term (current) drug therapy: Secondary | ICD-10-CM | POA: Insufficient documentation

## 2016-07-12 DIAGNOSIS — D472 Monoclonal gammopathy: Secondary | ICD-10-CM

## 2016-07-12 LAB — CBC WITH DIFFERENTIAL/PLATELET
Basophils Absolute: 0 10*3/uL (ref 0–0.1)
Basophils Relative: 0 %
Eosinophils Absolute: 0.2 10*3/uL (ref 0–0.7)
Eosinophils Relative: 3 %
HCT: 42.5 % (ref 40.0–52.0)
Hemoglobin: 15.1 g/dL (ref 13.0–18.0)
Lymphocytes Relative: 21 %
Lymphs Abs: 1.5 10*3/uL (ref 1.0–3.6)
MCH: 37.6 pg — ABNORMAL HIGH (ref 26.0–34.0)
MCHC: 35.6 g/dL (ref 32.0–36.0)
MCV: 105.6 fL — ABNORMAL HIGH (ref 80.0–100.0)
Monocytes Absolute: 0.7 10*3/uL (ref 0.2–1.0)
Monocytes Relative: 10 %
Neutro Abs: 4.6 10*3/uL (ref 1.4–6.5)
Neutrophils Relative %: 66 %
Platelets: 136 10*3/uL — ABNORMAL LOW (ref 150–440)
RBC: 4.02 MIL/uL — ABNORMAL LOW (ref 4.40–5.90)
RDW: 13.4 % (ref 11.5–14.5)
WBC: 7 10*3/uL (ref 3.8–10.6)

## 2016-07-12 LAB — COMPREHENSIVE METABOLIC PANEL
ALT: 38 U/L (ref 17–63)
AST: 48 U/L — ABNORMAL HIGH (ref 15–41)
Albumin: 4.2 g/dL (ref 3.5–5.0)
Alkaline Phosphatase: 107 U/L (ref 38–126)
Anion gap: 8 (ref 5–15)
BUN: 21 mg/dL — ABNORMAL HIGH (ref 6–20)
CO2: 23 mmol/L (ref 22–32)
Calcium: 9.5 mg/dL (ref 8.9–10.3)
Chloride: 106 mmol/L (ref 101–111)
Creatinine, Ser: 1.88 mg/dL — ABNORMAL HIGH (ref 0.61–1.24)
GFR calc Af Amer: 41 mL/min — ABNORMAL LOW (ref >60)
GFR calc non Af Amer: 35 mL/min — ABNORMAL LOW (ref >60)
Glucose, Bld: 114 mg/dL — ABNORMAL HIGH (ref 65–99)
Potassium: 3.7 mmol/L (ref 3.5–5.1)
Sodium: 137 mmol/L (ref 135–145)
Total Bilirubin: 7.6 mg/dL — ABNORMAL HIGH (ref 0.3–1.2)
Total Protein: 7.1 g/dL (ref 6.5–8.1)

## 2016-07-16 LAB — MULTIPLE MYELOMA PANEL, SERUM
Albumin SerPl Elph-Mcnc: 4.1 g/dL (ref 2.9–4.4)
Albumin/Glob SerPl: 1.8 — ABNORMAL HIGH (ref 0.7–1.7)
Alpha 1: 0.2 g/dL (ref 0.0–0.4)
Alpha2 Glob SerPl Elph-Mcnc: 0.6 g/dL (ref 0.4–1.0)
B-Globulin SerPl Elph-Mcnc: 0.9 g/dL (ref 0.7–1.3)
Gamma Glob SerPl Elph-Mcnc: 0.7 g/dL (ref 0.4–1.8)
Globulin, Total: 2.3 g/dL (ref 2.2–3.9)
IgA: 224 mg/dL (ref 61–437)
IgG (Immunoglobin G), Serum: 714 mg/dL (ref 700–1600)
IgM, Serum: 43 mg/dL (ref 20–172)
Total Protein ELP: 6.4 g/dL (ref 6.0–8.5)

## 2016-07-19 ENCOUNTER — Inpatient Hospital Stay (HOSPITAL_BASED_OUTPATIENT_CLINIC_OR_DEPARTMENT_OTHER): Payer: Medicare Other | Admitting: Internal Medicine

## 2016-07-19 DIAGNOSIS — D472 Monoclonal gammopathy: Secondary | ICD-10-CM

## 2016-07-19 DIAGNOSIS — D696 Thrombocytopenia, unspecified: Secondary | ICD-10-CM

## 2016-07-19 DIAGNOSIS — N183 Chronic kidney disease, stage 3 (moderate): Secondary | ICD-10-CM | POA: Diagnosis not present

## 2016-07-19 DIAGNOSIS — Z79899 Other long term (current) drug therapy: Secondary | ICD-10-CM | POA: Diagnosis not present

## 2016-07-19 DIAGNOSIS — Z87891 Personal history of nicotine dependence: Secondary | ICD-10-CM

## 2016-07-19 DIAGNOSIS — B20 Human immunodeficiency virus [HIV] disease: Secondary | ICD-10-CM

## 2016-07-19 NOTE — Progress Notes (Signed)
Patient here today for follow up.   

## 2016-07-19 NOTE — Progress Notes (Signed)
[place] OFFICE PROGRESS NOTE  Patient Care Team: Revelo, Elyse Jarvis, MD as PCP - General (Family Medicine)   SUMMARY OF HEMATOLOGIC/ONCOLOGIC HISTORY:  # MILD THROMBOCYTOPENIA [may 2017]- 130-140s- monitor for now.  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  Admits to mild easy bruising. Otherwise denies any nosebleeds or gum bleeds.Denies any unusual weight loss; a loss of appetite. Denies any shortness of breath cough or chest pain.   REVIEW OF SYSTEMS:  A complete 10 point review of system is done which is negative except mentioned above/history of present illness.   PAST MEDICAL HISTORY :  Past Medical History:  Diagnosis Date  . Hernia, abdominal   . HIV infection (HCC)    Sees Dr. Ola Spurr for this  . Inguinal hernia   . Kidney failure   . Neoplasm of skin     PAST SURGICAL HISTORY :   Past Surgical History:  Procedure Laterality Date  . CARPAL TUNNEL RELEASE Left   . HERNIA REPAIR  1997   Left Lower Abdomen- [place]  . HERNIA REPAIR  [id number]   [place]  . INGUINAL HERNIA REPAIR Left 1996   [place]  . INGUINAL HERNIA REPAIR Right 1993   [place]  . LAMINECTOMY    . OPEN ANTERIOR SHOULDER RECONSTRUCTION Right 08/2013   [facility]  . REPLACEMENT TOTAL KNEE Right   . REPLACEMENT TOTAL KNEE Left    [facility]  . UMBILICAL HERNIA REPAIR  1997   [place]    FAMILY HISTORY :   Family History  Problem Relation Age of Onset  . Diabetes Mother   . CAD Father 66    SOCIAL HISTORY:   Social History  Substance Use Topics  . Smoking status: Former Smoker    Quit date: 06/18/2013  . Smokeless tobacco:  Never Used  . Alcohol use 0.0 oz/week     Comment: Every other day    ALLERGIES:  has No Known Allergies.  MEDICATIONS:  Current Outpatient Prescriptions  Medication Sig Dispense Refill  . atazanavir (REYATAZ) 200 MG capsule Take 1 capsule by mouth 2 (two) times daily.    Marland Kitchen buPROPion (WELLBUTRIN SR) 150 MG 12 hr tablet Take 1 tablet by mouth 2 (two) times daily.    . clindamycin (CLEOCIN T) 1 % lotion Apply 1 application topically 1 day or 1 dose.    . DESCOVY 200-25 MG tablet Take 1 tablet by mouth 1 day or 1 dose.    . ranitidine (ZANTAC) 150 MG capsule Take 150 mg by mouth 2 (two) times daily.    . diclofenac sodium (VOLTAREN) 1 % GEL Apply 2 g topically 4 (four) times daily. Reported on 07/14/2015     No current facility-administered medications for this visit.     PHYSICAL EXAMINATION:   BP (!) 150/92 (BP Location: Left Arm, Patient Position: Sitting)   Pulse 88   Temp 98 F (36.7 C) (Tympanic)   Resp 16   Wt 194 lb 4 oz (88.1 kg)   BMI 27.87 kg/m   Filed Weights   07/19/16 1453  Weight: 194 lb 4 oz (88.1 kg)    GENERAL: Well-nourished well-developed; Alert, no  distress and comfortable. Alone.  ES: no pallor or icterus OROPHARYNX: no thrush or ulceration; good dentition  NECK: supple, no masses felt LYMPH:  no palpable lymphadenopathy in the cervical, axillary or inguinal regions LUNGS: clear to auscultation and  No wheeze or crackles HEART/CVS: regular rate & rhythm and no murmurs; No lower extremity edema ABDOMEN:abdomen soft, non-tender and normal bowel sounds Musculoskeletal:no cyanosis of digits and no clubbing  PSYCH: alert & oriented x 3 with fluent speech NEURO: no focal motor/sensory deficits SKIN:  no rashes or significant lesions  LABORATORY DATA:  I have reviewed the data as listed    Component Value Date/Time   NA 137 07/12/2016 1410   K 3.7 07/12/2016 1410   CL 106 07/12/2016 1410   CO2 23 07/12/2016 1410   GLUCOSE 114 (H) 07/12/2016 1410    BUN 21 (H) 07/12/2016 1410   CREATININE 1.88 (H) 07/12/2016 1410   CREATININE 1.91 (H) 05/30/2014 1001   CALCIUM 9.5 07/12/2016 1410   PROT 7.1 07/12/2016 1410   PROT 6.9 05/30/2014 1001   ALBUMIN 4.2 07/12/2016 1410   ALBUMIN 4.1 05/30/2014 1001   AST 48 (H) 07/12/2016 1410   AST 18 05/30/2014 1001   ALT 38 07/12/2016 1410   ALT 10 (L) 05/30/2014 1001   ALKPHOS 107 07/12/2016 1410   ALKPHOS 171 (H) 05/30/2014 1001   BILITOT 7.6 (H) 07/12/2016 1410   BILITOT 2.1 (H) 06/13/2014 1407   GFRNONAA 35 (L) 07/12/2016 1410   GFRNONAA 36 (L) 05/30/2014 1001   GFRAA 41 (L) 07/12/2016 1410   GFRAA 41 (L) 05/30/2014 1001    No results found for: SPEP, UPEP  Lab Results  Component Value Date   WBC 7.0 07/12/2016   NEUTROABS 4.6 07/12/2016   HGB 15.1 07/12/2016   HCT 42.5 07/12/2016   MCV 105.6 (H) 07/12/2016   PLT 136 (L) 07/12/2016      Chemistry      Component Value Date/Time   NA 137 07/12/2016 1410   K 3.7 07/12/2016 1410   CL 106 07/12/2016 1410   CO2 23 07/12/2016 1410   BUN 21 (H) 07/12/2016 1410   CREATININE 1.88 (H) 07/12/2016 1410   CREATININE 1.91 (H) 05/30/2014 1001      Component Value Date/Time   CALCIUM 9.5 07/12/2016 1410   ALKPHOS 107 07/12/2016 1410   ALKPHOS 171 (H) 05/30/2014 1001   AST 48 (H) 07/12/2016 1410   AST 18 05/30/2014 1001   ALT 38 07/12/2016 1410   ALT 10 (L) 05/30/2014 1001   BILITOT 7.6 (H) 07/12/2016 1410   BILITOT 2.1 (H) 06/13/2014 1407        ASSESSMENT & PLAN:  Thrombocytopenia ([place]) # 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 Repeat workup negative for M spike or urine protein. Kappa- lambda light chain normal; SPEP negative. White count hemoglobin normal. Repeat labs today/May 2018- normal limits/absent.   # ThromboCytopenia platelets 147 approximately year ago; today they're 136. I do not think this is a cause of patient's easy bruising.   # Chronic kidney disease-III- 1.8 stable. [follows up with Dr.Chen;  Nephrology; [facility]]   # follow-up in 1 year/CBC CMP. Will not monitor M protein.         Cammie Sickle, MD 07/19/2016 5:09 PM

## 2016-07-19 NOTE — Assessment & Plan Note (Deleted)
#   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 Repeat workup negative for M spike or urine protein. Today I lambda light chain normal; SPEP negative. White count hemoglobin normal.  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 Unclear etiology. Monitor for now.  # Chronic kidney disease-III- 1.8 stable.    #

## 2016-07-19 NOTE — Assessment & Plan Note (Signed)
#   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 Repeat workup negative for M spike or urine protein. Kappa- lambda light chain normal; SPEP negative. White count hemoglobin normal. Repeat labs today/May 2018- normal limits/absent.   # ThromboCytopenia platelets 147 approximately year ago; today they're 136. I do not think this is a cause of patient's easy bruising.   # Chronic kidney disease-III- 1.8 stable. [follows up with Dr.Chen; Nephrology; [facility]]   # follow-up in 1 year/CBC CMP. Will not monitor M protein.

## 2016-08-08 ENCOUNTER — Other Ambulatory Visit: Payer: Self-pay | Admitting: Family Medicine

## 2016-08-08 DIAGNOSIS — F1010 Alcohol abuse, uncomplicated: Secondary | ICD-10-CM

## 2016-08-13 ENCOUNTER — Ambulatory Visit: Payer: Medicare Other

## 2016-08-15 ENCOUNTER — Ambulatory Visit
Admission: RE | Admit: 2016-08-15 | Discharge: 2016-08-15 | Disposition: A | Discharge status: Home / Self Care | Payer: Medicare Other | Source: Ambulatory Visit | Attending: Family Medicine | Admitting: Family Medicine

## 2016-08-15 DIAGNOSIS — F1010 Alcohol abuse, uncomplicated: Secondary | ICD-10-CM | POA: Diagnosis not present

## 2016-12-09 ENCOUNTER — Other Ambulatory Visit: Payer: Self-pay | Admitting: Sports Medicine

## 2016-12-09 DIAGNOSIS — M7989 Other specified soft tissue disorders: Secondary | ICD-10-CM

## 2016-12-11 ENCOUNTER — Ambulatory Visit
Admission: RE | Admit: 2016-12-11 | Discharge: 2016-12-11 | Disposition: A | Discharge status: Home / Self Care | Payer: Medicare Other | Source: Ambulatory Visit | Attending: Sports Medicine | Admitting: Sports Medicine

## 2016-12-11 DIAGNOSIS — M7989 Other specified soft tissue disorders: Secondary | ICD-10-CM | POA: Insufficient documentation

## 2017-06-18 ENCOUNTER — Other Ambulatory Visit: Payer: Self-pay | Admitting: Nephrology

## 2017-06-18 DIAGNOSIS — R319 Hematuria, unspecified: Secondary | ICD-10-CM

## 2017-06-23 ENCOUNTER — Ambulatory Visit
Admission: RE | Admit: 2017-06-23 | Discharge: 2017-06-23 | Disposition: A | Discharge status: Home / Self Care | Payer: Medicare Other | Source: Ambulatory Visit | Attending: Nephrology | Admitting: Nephrology

## 2017-06-23 DIAGNOSIS — R319 Hematuria, unspecified: Secondary | ICD-10-CM

## 2017-06-23 DIAGNOSIS — N281 Cyst of kidney, acquired: Secondary | ICD-10-CM | POA: Insufficient documentation

## 2017-06-23 DIAGNOSIS — R93421 Abnormal radiologic findings on diagnostic imaging of right kidney: Secondary | ICD-10-CM | POA: Diagnosis not present

## 2017-07-21 ENCOUNTER — Inpatient Hospital Stay: Payer: Medicare Other

## 2017-07-21 ENCOUNTER — Inpatient Hospital Stay: Payer: Medicare Other | Admitting: Internal Medicine

## 2017-09-01 ENCOUNTER — Inpatient Hospital Stay: Payer: Medicare Other | Attending: Internal Medicine

## 2017-09-01 ENCOUNTER — Encounter: Payer: Self-pay | Admitting: Internal Medicine

## 2017-09-01 ENCOUNTER — Inpatient Hospital Stay (HOSPITAL_BASED_OUTPATIENT_CLINIC_OR_DEPARTMENT_OTHER): Payer: Medicare Other | Admitting: Internal Medicine

## 2017-09-01 VITALS — BP 136/93 | HR 89 | Temp 97.1°F | Resp 16 | Wt 201.8 lb

## 2017-09-01 DIAGNOSIS — F1020 Alcohol dependence, uncomplicated: Secondary | ICD-10-CM | POA: Diagnosis not present

## 2017-09-01 DIAGNOSIS — Z21 Asymptomatic human immunodeficiency virus [HIV] infection status: Secondary | ICD-10-CM | POA: Insufficient documentation

## 2017-09-01 DIAGNOSIS — D472 Monoclonal gammopathy: Secondary | ICD-10-CM | POA: Insufficient documentation

## 2017-09-01 DIAGNOSIS — N183 Chronic kidney disease, stage 3 (moderate): Secondary | ICD-10-CM | POA: Insufficient documentation

## 2017-09-01 DIAGNOSIS — D696 Thrombocytopenia, unspecified: Secondary | ICD-10-CM | POA: Diagnosis present

## 2017-09-01 LAB — CBC WITH DIFFERENTIAL/PLATELET
Basophils Absolute: 0 10*3/uL (ref 0–0.1)
Basophils Relative: 0 %
Eosinophils Absolute: 0.1 10*3/uL (ref 0–0.7)
Eosinophils Relative: 1 %
HCT: 46.2 % (ref 40.0–52.0)
Hemoglobin: 16.2 g/dL (ref 13.0–18.0)
Lymphocytes Relative: 25 %
Lymphs Abs: 2.5 10*3/uL (ref 1.0–3.6)
MCH: 38.7 pg — ABNORMAL HIGH (ref 26.0–34.0)
MCHC: 35.1 g/dL (ref 32.0–36.0)
MCV: 110.2 fL — ABNORMAL HIGH (ref 80.0–100.0)
Monocytes Absolute: 1.1 10*3/uL — ABNORMAL HIGH (ref 0.2–1.0)
Monocytes Relative: 12 %
Neutro Abs: 6.1 10*3/uL (ref 1.4–6.5)
Neutrophils Relative %: 62 %
Platelets: 183 10*3/uL (ref 150–440)
RBC: 4.19 MIL/uL — ABNORMAL LOW (ref 4.40–5.90)
RDW: 13.1 % (ref 11.5–14.5)
WBC: 9.9 10*3/uL (ref 3.8–10.6)

## 2017-09-01 LAB — COMPREHENSIVE METABOLIC PANEL
ALT: 38 U/L (ref 0–44)
AST: 54 U/L — ABNORMAL HIGH (ref 15–41)
Albumin: 4.2 g/dL (ref 3.5–5.0)
Alkaline Phosphatase: 97 U/L (ref 38–126)
Anion gap: 10 (ref 5–15)
BUN: 31 mg/dL — ABNORMAL HIGH (ref 8–23)
CO2: 24 mmol/L (ref 22–32)
Calcium: 9.6 mg/dL (ref 8.9–10.3)
Chloride: 103 mmol/L (ref 98–111)
Creatinine, Ser: 2.76 mg/dL — ABNORMAL HIGH (ref 0.61–1.24)
GFR calc Af Amer: 25 mL/min — ABNORMAL LOW (ref >60)
GFR calc non Af Amer: 22 mL/min — ABNORMAL LOW (ref >60)
Glucose, Bld: 93 mg/dL (ref 70–99)
Potassium: 4.4 mmol/L (ref 3.5–5.1)
Sodium: 137 mmol/L (ref 135–145)
Total Bilirubin: 1.6 mg/dL — ABNORMAL HIGH (ref 0.3–1.2)
Total Protein: 7.2 g/dL (ref 6.5–8.1)

## 2017-09-01 NOTE — Assessment & Plan Note (Addendum)
#   ThromboCytopenia platelets -intermittent.  Today platelets are 183.  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  Asymptomatic.  # Chronic kidney disease-III-BL- 1.8 . [follows up with Dr.Chong; Nephrology; [facility]] ; however creat 2.7 today;WORSE; ? Related to lisinopril.  given copy of labs/ and has repeat Blood work on July 3rd.   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  # Alcoholism- pt concerned; recommend abstinence; recommend AA; pt has been in the past.   # follow-up in 1 year/CBC CMP.

## 2017-09-01 NOTE — Progress Notes (Signed)
[facility] OFFICE PROGRESS NOTE  Patient Care Team: Revelo, Elyse Jarvis, MD as PCP - General (Family Medicine)   SUMMARY OF HEMATOLOGIC/ONCOLOGIC HISTORY:  # MILD THROMBOCYTOPENIA [may 2017]- 130-140s- monitor for now.  # FEB 2016- positive low level UPEP;April 2016- 24 h UPEP- Neg; SIEP/ K-L ratio=N; MAY 2018- NEG M protein; REPEAT June 2019-M PROTEIN- ABSENT  # HIV- undetectable [Dr.Fitzgerald]; Hx HepC [s/p treatment]; CKD [creat 1.7- 1.9]; Hx of Alcohol abuse.   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  Patient denies any significant weakness.  Denies any new lumps or bumps.  Appetite is good.   He states that he was recently started on lisinopril by his nephrologist.   REVIEW OF SYSTEMS:  A complete 10 point review of system is done which is negative except mentioned above/history of present illness.   PAST MEDICAL HISTORY :  Past Medical History:  Diagnosis Date  . Hernia, abdominal   . HIV infection (HCC)    Sees Dr. Ola Spurr for this  . Inguinal hernia   . Kidney failure   . Neoplasm of skin     PAST SURGICAL HISTORY :   Past Surgical History:  Procedure Laterality Date  . CARPAL TUNNEL RELEASE Left   . HERNIA REPAIR  1997   Left Lower Abdomen- [place]  . HERNIA REPAIR  [id number]   [place]  . INGUINAL HERNIA REPAIR Left 1996   [place]  . INGUINAL HERNIA REPAIR Right 1993   [place]  . LAMINECTOMY    . OPEN ANTERIOR SHOULDER RECONSTRUCTION Right 08/2013   [facility]  . REPLACEMENT TOTAL KNEE Right   . REPLACEMENT TOTAL KNEE Left    [facility]  . UMBILICAL HERNIA REPAIR  1997   [place]    FAMILY HISTORY :   Family History  Problem Relation Age of Onset  . Diabetes Mother   . CAD Father 75    SOCIAL HISTORY:   Social History   Tobacco Use  . Smoking status: Former Smoker   Last attempt to quit: 06/18/2013    Years since quitting: 4.2  . Smokeless tobacco: Never Used  Substance Use Topics  . Alcohol use: Yes    Alcohol/week: 0.0 oz    Comment: Every other day  . Drug use: No    Comment: Use to do cocaine 30 years ago    ALLERGIES:  has No Known Allergies.  MEDICATIONS:  Current Outpatient Medications  Medication Sig Dispense Refill  . bictegravir-emtricitabine-tenofovir AF (BIKTARVY) 50-200-25 MG TABS tablet     . cyclobenzaprine (FLEXERIL) 10 MG tablet     . lisinopril (PRINIVIL,ZESTRIL) 10 MG tablet Take by mouth.     No current facility-administered medications for this visit.     PHYSICAL EXAMINATION:   BP (!) 136/93 (BP Location: Left Arm, Patient Position: Sitting)   Pulse 89   Temp (!) 97.1 F (36.2 C) (Tympanic)   Resp 16   Wt 201 lb 12.8 oz (91.5 kg)   BMI 28.96 kg/m   Filed Weights   09/01/17 1433  Weight: 201 lb 12.8 oz (91.5 kg)    GENERAL: Well-nourished well-developed; Alert, no distress and comfortable. Alone.  ES: no pallor or icterus OROPHARYNX: no thrush or ulceration; good dentition  NECK: supple, no masses felt LYMPH:  no palpable lymphadenopathy in the cervical, axillary or inguinal regions LUNGS: clear to  auscultation and  No wheeze or crackles HEART/CVS: regular rate & rhythm and no murmurs; No lower extremity edema ABDOMEN:abdomen soft, non-tender and normal bowel sounds Musculoskeletal:no cyanosis of digits and no clubbing  PSYCH: alert & oriented x 3 with fluent speech NEURO: no focal motor/sensory deficits SKIN:  no rashes or significant lesions  LABORATORY DATA:  I have reviewed the data as listed    Component Value Date/Time   NA 137 09/01/2017 1359   K 4.4 09/01/2017 1359   CL 103 09/01/2017 1359   CO2 24 09/01/2017 1359   GLUCOSE 93 09/01/2017 1359   BUN 31 (H) 09/01/2017 1359   CREATININE 2.76 (H) 09/01/2017 1359   CREATININE 1.91 (H) 05/30/2014 1001   CALCIUM 9.6 09/01/2017 1359   PROT 7.2  09/01/2017 1359   PROT 6.9 05/30/2014 1001   ALBUMIN 4.2 09/01/2017 1359   ALBUMIN 4.1 05/30/2014 1001   AST 54 (H) 09/01/2017 1359   AST 18 05/30/2014 1001   ALT 38 09/01/2017 1359   ALT 10 (L) 05/30/2014 1001   ALKPHOS 97 09/01/2017 1359   ALKPHOS 171 (H) 05/30/2014 1001   BILITOT 1.6 (H) 09/01/2017 1359   BILITOT 2.1 (H) 06/13/2014 1407   GFRNONAA 22 (L) 09/01/2017 1359   GFRNONAA 36 (L) 05/30/2014 1001   GFRAA 25 (L) 09/01/2017 1359   GFRAA 41 (L) 05/30/2014 1001    No results found for: SPEP, UPEP  Lab Results  Component Value Date   WBC 9.9 09/01/2017   NEUTROABS 6.1 09/01/2017   HGB 16.2 09/01/2017   HCT 46.2 09/01/2017   MCV 110.2 (H) 09/01/2017   PLT 183 09/01/2017      Chemistry      Component Value Date/Time   NA 137 09/01/2017 1359   K 4.4 09/01/2017 1359   CL 103 09/01/2017 1359   CO2 24 09/01/2017 1359   BUN 31 (H) 09/01/2017 1359   CREATININE 2.76 (H) 09/01/2017 1359   CREATININE 1.91 (H) 05/30/2014 1001      Component Value Date/Time   CALCIUM 9.6 09/01/2017 1359   ALKPHOS 97 09/01/2017 1359   ALKPHOS 171 (H) 05/30/2014 1001   AST 54 (H) 09/01/2017 1359   AST 18 05/30/2014 1001   ALT 38 09/01/2017 1359   ALT 10 (L) 05/30/2014 1001   BILITOT 1.6 (H) 09/01/2017 1359   BILITOT 2.1 (H) 06/13/2014 1407        ASSESSMENT & PLAN:  Thrombocytopenia ([place]) # ThromboCytopenia platelets -intermittent.  Today platelets are 183.  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  Asymptomatic.  # Chronic kidney disease-III-BL- 1.8 . [follows up with Dr.Chong; Nephrology; [facility]] ; however creat 2.7 today;WORSE; ? Related to lisinopril.  given copy of labs/ and has repeat Blood work on July 3rd.   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  # Alcoholism- pt concerned; recommend abstinence; recommend AA; pt has been in the past.   # follow-up in 1 year/CBC CMP.         Anthony Sickle, MD 09/04/2017 7:33 PM

## 2017-09-18 ENCOUNTER — Other Ambulatory Visit: Payer: Self-pay

## 2017-09-18 ENCOUNTER — Emergency Department: Payer: Medicare Other

## 2017-09-18 ENCOUNTER — Inpatient Hospital Stay
Admission: EM | Admit: 2017-09-18 | Discharge: 2017-09-23 | DRG: 683 | Disposition: A | Discharge status: Home Health | Payer: Medicare Other | Attending: Internal Medicine | Admitting: Internal Medicine

## 2017-09-18 ENCOUNTER — Inpatient Hospital Stay: Payer: Medicare Other

## 2017-09-18 DIAGNOSIS — N183 Chronic kidney disease, stage 3 (moderate): Secondary | ICD-10-CM | POA: Diagnosis present

## 2017-09-18 DIAGNOSIS — G4700 Insomnia, unspecified: Secondary | ICD-10-CM | POA: Diagnosis present

## 2017-09-18 DIAGNOSIS — W010XXA Fall on same level from slipping, tripping and stumbling without subsequent striking against object, initial encounter: Secondary | ICD-10-CM | POA: Diagnosis present

## 2017-09-18 DIAGNOSIS — T368X5A Adverse effect of other systemic antibiotics, initial encounter: Secondary | ICD-10-CM | POA: Diagnosis present

## 2017-09-18 DIAGNOSIS — R55 Syncope and collapse: Secondary | ICD-10-CM | POA: Diagnosis not present

## 2017-09-18 DIAGNOSIS — R05 Cough: Secondary | ICD-10-CM

## 2017-09-18 DIAGNOSIS — D696 Thrombocytopenia, unspecified: Secondary | ICD-10-CM | POA: Diagnosis present

## 2017-09-18 DIAGNOSIS — S0240CA Maxillary fracture, right side, initial encounter for closed fracture: Secondary | ICD-10-CM | POA: Diagnosis present

## 2017-09-18 DIAGNOSIS — Z87891 Personal history of nicotine dependence: Secondary | ICD-10-CM

## 2017-09-18 DIAGNOSIS — S022XXA Fracture of nasal bones, initial encounter for closed fracture: Secondary | ICD-10-CM | POA: Diagnosis present

## 2017-09-18 DIAGNOSIS — F10229 Alcohol dependence with intoxication, unspecified: Secondary | ICD-10-CM | POA: Diagnosis present

## 2017-09-18 DIAGNOSIS — R3129 Other microscopic hematuria: Secondary | ICD-10-CM | POA: Diagnosis present

## 2017-09-18 DIAGNOSIS — E860 Dehydration: Secondary | ICD-10-CM | POA: Diagnosis present

## 2017-09-18 DIAGNOSIS — R319 Hematuria, unspecified: Secondary | ICD-10-CM | POA: Diagnosis present

## 2017-09-18 DIAGNOSIS — F10239 Alcohol dependence with withdrawal, unspecified: Secondary | ICD-10-CM | POA: Diagnosis not present

## 2017-09-18 DIAGNOSIS — B369 Superficial mycosis, unspecified: Secondary | ICD-10-CM | POA: Diagnosis present

## 2017-09-18 DIAGNOSIS — N179 Acute kidney failure, unspecified: Principal | ICD-10-CM | POA: Diagnosis present

## 2017-09-18 DIAGNOSIS — I9589 Other hypotension: Secondary | ICD-10-CM | POA: Diagnosis present

## 2017-09-18 DIAGNOSIS — E872 Acidosis: Secondary | ICD-10-CM | POA: Diagnosis present

## 2017-09-18 DIAGNOSIS — Z96653 Presence of artificial knee joint, bilateral: Secondary | ICD-10-CM | POA: Diagnosis present

## 2017-09-18 DIAGNOSIS — Z79899 Other long term (current) drug therapy: Secondary | ICD-10-CM

## 2017-09-18 DIAGNOSIS — I129 Hypertensive chronic kidney disease with stage 1 through stage 4 chronic kidney disease, or unspecified chronic kidney disease: Secondary | ICD-10-CM | POA: Diagnosis present

## 2017-09-18 DIAGNOSIS — E871 Hypo-osmolality and hyponatremia: Secondary | ICD-10-CM | POA: Diagnosis present

## 2017-09-18 DIAGNOSIS — E861 Hypovolemia: Secondary | ICD-10-CM | POA: Diagnosis present

## 2017-09-18 DIAGNOSIS — B20 Human immunodeficiency virus [HIV] disease: Secondary | ICD-10-CM | POA: Diagnosis present

## 2017-09-18 DIAGNOSIS — R059 Cough, unspecified: Secondary | ICD-10-CM

## 2017-09-18 LAB — CBC WITH DIFFERENTIAL/PLATELET
Basophils Absolute: 0 10*3/uL (ref 0–0.1)
Basophils Relative: 0 %
Eosinophils Absolute: 0.1 10*3/uL (ref 0–0.7)
Eosinophils Relative: 1 %
HCT: 42.6 % (ref 40.0–52.0)
Hemoglobin: 14.9 g/dL (ref 13.0–18.0)
Lymphocytes Relative: 36 %
Lymphs Abs: 4 10*3/uL — ABNORMAL HIGH (ref 1.0–3.6)
MCH: 38.5 pg — ABNORMAL HIGH (ref 26.0–34.0)
MCHC: 34.9 g/dL (ref 32.0–36.0)
MCV: 110.2 fL — ABNORMAL HIGH (ref 80.0–100.0)
Monocytes Absolute: 0.9 10*3/uL (ref 0.2–1.0)
Monocytes Relative: 8 %
Neutro Abs: 6 10*3/uL (ref 1.4–6.5)
Neutrophils Relative %: 55 %
Platelets: 180 10*3/uL (ref 150–440)
RBC: 3.87 MIL/uL — ABNORMAL LOW (ref 4.40–5.90)
RDW: 12.8 % (ref 11.5–14.5)
WBC: 11.1 10*3/uL — ABNORMAL HIGH (ref 3.8–10.6)

## 2017-09-18 LAB — COMPREHENSIVE METABOLIC PANEL
ALT: 76 U/L — ABNORMAL HIGH (ref 0–44)
AST: 72 U/L — ABNORMAL HIGH (ref 15–41)
Albumin: 4.1 g/dL (ref 3.5–5.0)
Alkaline Phosphatase: 98 U/L (ref 38–126)
Anion gap: 12 (ref 5–15)
BUN: 50 mg/dL — ABNORMAL HIGH (ref 8–23)
CO2: 18 mmol/L — ABNORMAL LOW (ref 22–32)
Calcium: 9.5 mg/dL (ref 8.9–10.3)
Chloride: 99 mmol/L (ref 98–111)
Creatinine, Ser: 5.1 mg/dL — ABNORMAL HIGH (ref 0.61–1.24)
GFR calc Af Amer: 12 mL/min — ABNORMAL LOW (ref >60)
GFR calc non Af Amer: 10 mL/min — ABNORMAL LOW (ref >60)
Glucose, Bld: 100 mg/dL — ABNORMAL HIGH (ref 70–99)
Potassium: 4.4 mmol/L (ref 3.5–5.1)
Sodium: 129 mmol/L — ABNORMAL LOW (ref 135–145)
Total Bilirubin: 0.7 mg/dL (ref 0.3–1.2)
Total Protein: 6.9 g/dL (ref 6.5–8.1)

## 2017-09-18 LAB — URINE DRUG SCREEN, QUALITATIVE (ARMC ONLY)
Amphetamines, Ur Screen: NOT DETECTED
Benzodiazepine, Ur Scrn: NOT DETECTED
Cannabinoid 50 Ng, Ur ~~LOC~~: NOT DETECTED
Cocaine Metabolite,Ur ~~LOC~~: NOT DETECTED
MDMA (Ecstasy)Ur Screen: NOT DETECTED
Methadone Scn, Ur: NOT DETECTED
Opiate, Ur Screen: NOT DETECTED
Phencyclidine (PCP) Ur S: NOT DETECTED
Tricyclic, Ur Screen: NOT DETECTED

## 2017-09-18 LAB — URINALYSIS, COMPLETE (UACMP) WITH MICROSCOPIC
Bacteria, UA: NONE SEEN
Bilirubin Urine: NEGATIVE
Glucose, UA: 150 mg/dL — AB
Ketones, ur: NEGATIVE mg/dL
Leukocytes, UA: NEGATIVE
Nitrite: NEGATIVE
Protein, ur: NEGATIVE mg/dL
Specific Gravity, Urine: 1.006 (ref 1.005–1.030)
pH: 6 (ref 5.0–8.0)

## 2017-09-18 LAB — GLUCOSE, CAPILLARY: Glucose-Capillary: 88 mg/dL (ref 70–99)

## 2017-09-18 MED ORDER — LORAZEPAM 2 MG/ML IJ SOLN
0.0000 mg | Freq: Two times a day (BID) | INTRAMUSCULAR | Status: AC
Start: 2017-09-20 — End: 2017-09-22
  Administered 2017-09-21: 2 mg via INTRAVENOUS
  Filled 2017-09-18 (×3): qty 1

## 2017-09-18 MED ORDER — VITAMIN B-1 100 MG PO TABS
500.0000 mg | ORAL_TABLET | Freq: Once | ORAL | Status: AC
  Administered 2017-09-18: 500 mg via ORAL
  Filled 2017-09-18: qty 5

## 2017-09-18 MED ORDER — ACETAMINOPHEN 650 MG RE SUPP: 650.0000 mg | Freq: Four times a day (QID) | RECTAL | Status: DC | PRN

## 2017-09-18 MED ORDER — LORAZEPAM 2 MG/ML IJ SOLN
0.0000 mg | Freq: Four times a day (QID) | INTRAMUSCULAR | Status: AC
Start: 2017-09-18 — End: 2017-09-20

## 2017-09-18 MED ORDER — FOLIC ACID 1 MG PO TABS
1.0000 mg | ORAL_TABLET | Freq: Every day | ORAL | Status: DC
  Administered 2017-09-19 – 2017-09-23 (×5): 1 mg via ORAL
  Filled 2017-09-18 (×5): qty 1

## 2017-09-18 MED ORDER — SODIUM CHLORIDE 0.9 % IV SOLN
Freq: Once | INTRAVENOUS | Status: AC
  Administered 2017-09-18: 22:00:00 via INTRAVENOUS

## 2017-09-18 MED ORDER — LORAZEPAM 2 MG/ML IJ SOLN
1.0000 mg | Freq: Four times a day (QID) | INTRAMUSCULAR | Status: DC | PRN
Start: 2017-09-18 — End: 2017-09-21
  Administered 2017-09-20 – 2017-09-21 (×2): 1 mg via INTRAVENOUS
  Filled 2017-09-18 (×2): qty 1

## 2017-09-18 MED ORDER — ACETAMINOPHEN 325 MG PO TABS: 650.0000 mg | ORAL_TABLET | Freq: Four times a day (QID) | ORAL | Status: DC | PRN

## 2017-09-18 MED ORDER — ADULT MULTIVITAMIN W/MINERALS CH
1.0000 | ORAL_TABLET | Freq: Every day | ORAL | Status: DC
  Administered 2017-09-19 – 2017-09-23 (×5): 1 via ORAL
  Filled 2017-09-18 (×5): qty 1

## 2017-09-18 MED ORDER — SODIUM CHLORIDE 0.9 % IV BOLUS
1000.0000 mL | Freq: Once | INTRAVENOUS | Status: AC
  Administered 2017-09-18: 1000 mL via INTRAVENOUS

## 2017-09-18 MED ORDER — HYDROCODONE-ACETAMINOPHEN 5-325 MG PO TABS
1.0000 | ORAL_TABLET | ORAL | Status: DC | PRN
  Administered 2017-09-19 – 2017-09-20 (×5): 1 via ORAL
  Administered 2017-09-21: 2 via ORAL
  Filled 2017-09-18 (×3): qty 1
  Filled 2017-09-18: qty 2
  Filled 2017-09-18 (×2): qty 1

## 2017-09-18 MED ORDER — LIDOCAINE-EPINEPHRINE-TETRACAINE (LET) SOLUTION
3.0000 mL | Freq: Once | NASAL | Status: AC
  Administered 2017-09-18: 3 mL via TOPICAL
  Filled 2017-09-18: qty 3

## 2017-09-18 MED ORDER — BISACODYL 5 MG PO TBEC: 5.0000 mg | DELAYED_RELEASE_TABLET | Freq: Every day | ORAL | Status: DC | PRN

## 2017-09-18 MED ORDER — ONDANSETRON HCL 4 MG/2ML IJ SOLN: 4.0000 mg | Freq: Four times a day (QID) | INTRAMUSCULAR | Status: DC | PRN

## 2017-09-18 MED ORDER — TRAZODONE HCL 50 MG PO TABS
25.0000 mg | ORAL_TABLET | Freq: Every evening | ORAL | Status: DC | PRN
  Administered 2017-09-19 – 2017-09-20 (×3): 25 mg via ORAL
  Filled 2017-09-18 (×3): qty 1

## 2017-09-18 MED ORDER — DOCUSATE SODIUM 100 MG PO CAPS
100.0000 mg | ORAL_CAPSULE | Freq: Two times a day (BID) | ORAL | Status: DC
  Administered 2017-09-19 – 2017-09-23 (×7): 100 mg via ORAL
  Filled 2017-09-18 (×7): qty 1

## 2017-09-18 MED ORDER — VITAMIN B-1 100 MG PO TABS
100.0000 mg | ORAL_TABLET | Freq: Every day | ORAL | Status: DC
  Administered 2017-09-19 – 2017-09-23 (×4): 100 mg via ORAL
  Filled 2017-09-18 (×6): qty 1

## 2017-09-18 MED ORDER — ONDANSETRON HCL 4 MG PO TABS: 4.0000 mg | ORAL_TABLET | Freq: Four times a day (QID) | ORAL | Status: DC | PRN

## 2017-09-18 MED ORDER — THIAMINE HCL 100 MG/ML IJ SOLN
100.0000 mg | Freq: Every day | INTRAMUSCULAR | Status: DC
Start: 2017-09-19 — End: 2017-09-23
  Administered 2017-09-20: 100 mg via INTRAVENOUS
  Filled 2017-09-18 (×2): qty 2

## 2017-09-18 MED ORDER — LORAZEPAM 1 MG PO TABS: 1.0000 mg | ORAL_TABLET | Freq: Four times a day (QID) | ORAL | Status: DC | PRN

## 2017-09-18 MED ORDER — HEPARIN SODIUM (PORCINE) 5000 UNIT/ML IJ SOLN
5000.0000 [IU] | Freq: Three times a day (TID) | INTRAMUSCULAR | Status: DC
  Administered 2017-09-19 – 2017-09-20 (×5): 5000 [IU] via SUBCUTANEOUS
  Filled 2017-09-18 (×5): qty 1

## 2017-09-18 MED ORDER — SODIUM CHLORIDE 0.9 % IV SOLN
INTRAVENOUS | Status: DC
  Administered 2017-09-18 – 2017-09-21 (×7): via INTRAVENOUS

## 2017-09-18 NOTE — ED Notes (Signed)
1st attempt to call report to floor

## 2017-09-18 NOTE — ED Notes (Signed)
Patient transported to X-ray 

## 2017-09-18 NOTE — ED Triage Notes (Addendum)
Pt arrived via EMS c/o fall face forward at home from standing position. Denies LOC. Injury to nose and missing tooth. Admits to drinking alcohol today.

## 2017-09-18 NOTE — ED Provider Notes (Signed)
[facility] Emergency Department Provider Note    First MD Initiated Contact with Patient 09/18/17 1915     (approximate)  I have reviewed the triage vital signs and the nursing notes.   HISTORY  Chief Complaint Fall    HPI Ramone Gander. is a 72 y.o. male history of HIV as well as 1/5 of liquor per day drinking alcohol history presents the ER with chief complaint of fall after the patient stood up states he stood up quickly and passed out.  Woke up on the ground.  He was having bleeding from his nose.  Denies any numbness or tingling.  No chest pain or shortness of breath.  Denies any nausea or vomiting.  Patient arrives to the ER slightly intoxicated.     Past Medical History:  Diagnosis Date  . Hernia, abdominal   . HIV infection (HCC)    Sees Dr. Ola Spurr for this  . Inguinal hernia   . Kidney failure   . Neoplasm of skin    Family History  Problem Relation Age of Onset  . Diabetes Mother   . CAD Father 71   Past Surgical History:  Procedure Laterality Date  . CARPAL TUNNEL RELEASE Left   . HERNIA REPAIR  1997   Left Lower Abdomen- [place]  . HERNIA REPAIR  [id number]   [place]  . INGUINAL HERNIA REPAIR Left 1996   [place]  . INGUINAL HERNIA REPAIR Right 1993   [place]  . LAMINECTOMY    . OPEN ANTERIOR SHOULDER RECONSTRUCTION Right 08/2013   [facility]  . REPLACEMENT TOTAL KNEE Right   . REPLACEMENT TOTAL KNEE Left    [facility]  . UMBILICAL HERNIA REPAIR  1997   [place]   Patient Active Problem List   Diagnosis Date Noted  . 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 Thrombocytopenia ([place]) 07/19/2016  . Right inguinal hernia 02/01/2015  . Right hip pain 02/01/2015  . H/O neoplasm 10/03/2014  . 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 LBP (low back pain) 03/11/2014  . Acute confusion 02/17/2014  . Acute renal failure ([place]) 02/17/2014  . Arterial blood pressure decreased  02/17/2014  . Elevated WBC count 02/17/2014  . Anxiety and depression 02/15/2014  . Personal history of other infectious and parasitic diseases 02/15/2014  . Arthritis of knee, degenerative 12/13/2013  . Acid reflux 08/02/2013  . Human immunodeficiency virus (HIV) infection ([place]) 08/02/2013      Prior to Admission medications   Medication Sig Start Date End Date Taking? Authorizing Provider  bictegravir-emtricitabine-tenofovir AF (BIKTARVY) 50-200-25 MG TABS tablet Take 1 tablet by mouth daily.  04/21/17  Yes [provider]  lisinopril (PRINIVIL,ZESTRIL) 10 MG tablet Take by mouth. 08/22/17 08/22/18 Yes [provider]    Allergies Patient has no known allergies.    Social History Social History   Tobacco Use  . Smoking status: Former Smoker    Last attempt to quit: 06/18/2013    Years since quitting: 4.2  . Smokeless tobacco: Never Used  Substance Use Topics  . Alcohol use: Yes    Comment: Pt states he drinks 1/5 day   . Drug use: No    Comment: Use to do cocaine 30 years ago    Review of Systems Patient denies headaches, rhinorrhea, blurry vision, numbness, shortness of breath, chest pain, edema, cough, abdominal pain, nausea, vomiting, diarrhea, dysuria, fevers, rashes or hallucinations unless otherwise stated above in HPI. ____________________________________________  PHYSICAL EXAM:  VITAL SIGNS: Vitals:   09/18/17 2000 09/18/17 2030  BP: 92/66 92/66  Pulse: 86 76  Resp: 13 13  Temp:    SpO2: 93% 96%    Constitutional: Alert and oriented. Smells of etoh Eyes: Conjunctivae are normal.  Head: contusion to bridge or nose with dried blood,  Nose: No congestion/rhinnorhea. No septal hematoma Mouth/Throat: tooth 8 is loose but intact. 9 is absent now Mucous membranes are moist.   Neck: No stridor. Painless ROM.  Cardiovascular: Normal rate, regular rhythm. Grossly normal heart sounds.  Good peripheral circulation. Respiratory: Normal  respiratory effort.  No retractions. Lungs CTAB. Gastrointestinal: Soft and nontender. No distention. No abdominal bruits. No CVA tenderness.  Genitourinary: deferred Musculoskeletal: No lower extremity tenderness nor edema.  No joint effusions. Neurologic:  Normal speech and language. No gross focal neurologic deficits are appreciated. No facial droop Skin:  Skin is warm, dry and intact. No rash noted. Psychiatric: Mood and affect are normal. Speech and behavior are normal.  ____________________________________________   LABS (all labs ordered are listed, but only abnormal results are displayed)  Results for orders placed or performed during the hospital encounter of 09/18/17 (from the past 24 hour(s))  CBC with Differential/Platelet     Status: Abnormal   Collection Time: 09/18/17  7:25 PM  Result Value Ref Range   WBC 11.1 (H) 3.8 - 10.6 K/uL   RBC 3.87 (L) 4.40 - 5.90 MIL/uL   Hemoglobin 14.9 13.0 - 18.0 g/dL   HCT 42.6 40.0 - 52.0 %   MCV 110.2 (H) 80.0 - 100.0 fL   MCH 38.5 (H) 26.0 - 34.0 pg   MCHC 34.9 32.0 - 36.0 g/dL   RDW 12.8 11.5 - 14.5 %   Platelets 180 150 - 440 K/uL   Neutrophils Relative % 55 %   Neutro Abs 6.0 1.4 - 6.5 K/uL   Lymphocytes Relative 36 %   Lymphs Abs 4.0 (H) 1.0 - 3.6 K/uL   Monocytes Relative 8 %   Monocytes Absolute 0.9 0.2 - 1.0 K/uL   Eosinophils Relative 1 %   Eosinophils Absolute 0.1 0 - 0.7 K/uL   Basophils Relative 0 %   Basophils Absolute 0.0 0 - 0.1 K/uL  Comprehensive metabolic panel     Status: Abnormal   Collection Time: 09/18/17  7:25 PM  Result Value Ref Range   Sodium 129 (L) 135 - 145 mmol/L   Potassium 4.4 3.5 - 5.1 mmol/L   Chloride 99 98 - 111 mmol/L   CO2 18 (L) 22 - 32 mmol/L   Glucose, Bld 100 (H) 70 - 99 mg/dL   BUN 50 (H) 8 - 23 mg/dL   Creatinine, Ser 5.10 (H) 0.61 - 1.24 mg/dL   Calcium 9.5 8.9 - 10.3 mg/dL   Total Protein 6.9 6.5 - 8.1 g/dL   Albumin 4.1 3.5 - 5.0 g/dL   AST 72 (H) 15 - 41 U/L   ALT 76 (H)  0 - 44 U/L   Alkaline Phosphatase 98 38 - 126 U/L   Total Bilirubin 0.7 0.3 - 1.2 mg/dL   GFR calc non Af Amer 10 (L) >60 mL/min   GFR calc Af Amer 12 (L) >60 mL/min   Anion gap 12 5 - 15   ____________________________________________  EKG My review and personal interpretation at Time: 19:23   Indication: hypotension  Rate: 90  Rhythm: sinus Axis: normal Other: normal intervals, no stemi,  ____________________________________________  RADIOLOGY  I personally reviewed all radiographic  images ordered to evaluate for the above acute complaints and reviewed radiology reports and findings.  These findings were personally discussed with the patient.  Please see medical record for radiology report.  ____________________________________________   PROCEDURES  Procedure(s) performed:  .Critical Care Performed by: Merlyn Lot, MD Authorized by: Merlyn Lot, MD   Critical care provider statement:    Critical care time (minutes):  30   Critical care time was exclusive of:  Separately billable procedures and treating other patients   Critical care was necessary to treat or prevent imminent or life-threatening deterioration of the following conditions:  Renal failure and dehydration   Critical care was time spent personally by me on the following activities:  Development of treatment plan with patient or surrogate, discussions with consultants, evaluation of patient's response to treatment, examination of patient, obtaining history from patient or surrogate, ordering and performing treatments and interventions, ordering and review of laboratory studies, ordering and review of radiographic studies, pulse oximetry, re-evaluation of patient's condition and review of old charts      Critical Care performed: yes ____________________________________________   INITIAL IMPRESSION / ASSESSMENT AND PLAN / ED COURSE  Pertinent labs & imaging results that were available during my care  of the patient were reviewed by me and considered in my medical decision making (see chart for details).   DDX: sah, sdh, edh, fracture, contusion, soft tissue injury, viscous injury, concussion, hemorrhage   Filipe Greathouse. is a 73 y.o. who presents to the ED with syncopal event and fall with facial injury as described above.  CT imaging ordered to evaluate for traumatic injury shows a non-displaced nasal fracture as well as fractures of the teeth no evidence of acute intracranial abnormality or cervical spine fracture.  Patient profoundly dehydrated mildly hypotensive but maps are fortunately greater than 65.  He is afebrile.  Will give IV fluids.  Will check blood work for the above differential.  Seems less consistent with dysrhythmia and based on his low blood pressure, significant alcohol intake do suspect more component of hypovolemia. possible metabolic derangement.  The patient will be placed on continuous pulse oximetry and telemetry for monitoring.  Laboratory evaluation will be sent to evaluate for the above complaints.     Clinical Course as of Sep 18 2113  Thu Sep 18, 2017  2007 Patient with persistent low blood pressure.  Has significant renal insufficiency with creatinine of 5.1 as well as metabolic acidosis.  Fortunately his potassium is stable at this point.  We will continue with IV resuscitation.   [PR]  2110 Splint applied with dental cement to tooth for support.  We will continue with IV fluids for hydration.  I do suspect the patient's AKI and renal failure is multifactorial likely secondary to dehydration as well as persistent alcohol abuse worsened by prescription for Bactrim.  Patient will require hospitalization for additional IV hydration and reassessment.   [PR]    Clinical Course User Index [PR] Merlyn Lot, MD     As part of my medical decision making, I reviewed the following data within the [facility] notes reviewed and  incorporated, Labs reviewed, notes from prior ED visits.  ____________________________________________   FINAL CLINICAL IMPRESSION(S) / ED DIAGNOSES  Final diagnoses:  AKI (acute kidney injury) ([place])  Hypotension due to hypovolemia  Syncope and collapse      NEW MEDICATIONS STARTED DURING THIS VISIT:  New Prescriptions   No medications on file     Note:  This document was prepared using Dragon voice recognition software and may include unintentional dictation errors.    Merlyn Lot, MD 09/18/17 2115

## 2017-09-18 NOTE — ED Notes (Signed)
Patient is going to [facility]

## 2017-09-18 NOTE — ED Notes (Signed)
Patient transported to CT 

## 2017-09-18 NOTE — H&P (Addendum)
[facility] NAME: Anthony Olson    MR#:  [id number]  DATE OF BIRTH:  Oct 27, 1945  DATE OF ADMISSION:  09/18/2017  PRIMARY CARE PHYSICIAN: Alene Mires Elyse Jarvis, MD   REQUESTING/REFERRING PHYSICIAN:   CHIEF COMPLAINT:   Chief Complaint  Patient presents with  . Fall    HISTORY OF PRESENT ILLNESS: Anthony Olson  is a 72 y.o. male with a known history of HIV infection, kidney failure, alcohol abuse and dependence. Patient was brought to emergency room status post fall at home.  Patient recalls that he lost his balance and fell face down.  He he thinks that he passed out and eventually woke up on the ground.  There was no witness to his fall.  Patient was able to call his son on the phone, who alerted EMS.  Patient does admit to drinking 1/5 of liquor per day and he was drunk earlier, today when he had a fall.  No reports of fever, chills, chest pain, shortness of breath, palpitations.  No nausea, vomiting, diarrhea, bleeding. Blood test done emergency room are remarkable for sodium level 129, creatinine level is elevated at 5.1, BUN is 50. CT maxillofacial shows nasal bone fracture and fracture of the right maxillary central incisor root. EKG, reviewed by myself, shows normal sinus rhythm with heart rate at 90; no acute ST-T changes. Patient is admitted for further evaluation and treatment.  PAST MEDICAL HISTORY:   Past Medical History:  Diagnosis Date  . Hernia, abdominal   . HIV infection (HCC)    Sees Dr. Ola Spurr for this  . Inguinal hernia   . Kidney failure   . Neoplasm of skin     PAST SURGICAL HISTORY:  Past Surgical History:  Procedure Laterality Date  . CARPAL TUNNEL RELEASE Left   . HERNIA REPAIR  1997   Left Lower Abdomen- [place]  . HERNIA REPAIR  0626   [place]  . INGUINAL HERNIA REPAIR Left 1996   [place]  . INGUINAL HERNIA REPAIR Right 1993   [place]  . LAMINECTOMY     . OPEN ANTERIOR SHOULDER RECONSTRUCTION Right 08/2013   [facility]  . REPLACEMENT TOTAL KNEE Right   . REPLACEMENT TOTAL KNEE Left    [facility]  . UMBILICAL HERNIA REPAIR  1997   [place]    SOCIAL HISTORY:  Social History   Tobacco Use  . Smoking status: Former Smoker    Last attempt to quit: 06/18/2013    Years since quitting: 4.2  . Smokeless tobacco: Never Used  Substance Use Topics  . Alcohol use: Yes    Comment: Pt states he drinks 1/5 day     FAMILY HISTORY:  Family History  Problem Relation Age of Onset  . Diabetes Mother   . CAD Father 21    DRUG ALLERGIES: No Known Allergies  REVIEW OF SYSTEMS:   CONSTITUTIONAL: No fever, but patient complains of fatigue and generalized weakness.  EYES: No blurred or double vision.  EARS, NOSE, AND THROAT: Positive for nasal bone fracture. RESPIRATORY: No cough, shortness of breath, wheezing or hemoptysis.  CARDIOVASCULAR: No chest pain, orthopnea, edema.  GASTROINTESTINAL: No nausea, vomiting, diarrhea or abdominal pain.  GENITOURINARY: No dysuria, hematuria.  ENDOCRINE: No polyuria, nocturia,  HEMATOLOGY: No anemia, easy bruising or bleeding SKIN: No rash or lesion. MUSCULOSKELETAL: No joint pain or arthritis.   NEUROLOGIC: No focal weakness.  PSYCHIATRY: No anxiety or depression.   MEDICATIONS AT  HOME:  Prior to Admission medications   Medication Sig Start Date End Date Taking? Authorizing Provider  bictegravir-emtricitabine-tenofovir AF (BIKTARVY) 50-200-25 MG TABS tablet Take 1 tablet by mouth daily.  04/21/17  Yes [provider]  lisinopril (PRINIVIL,ZESTRIL) 10 MG tablet Take by mouth. 08/22/17 08/22/18 Yes [provider]      PHYSICAL EXAMINATION:   VITAL SIGNS: Blood pressure 127/83, pulse 83, temperature 98.5 F (36.9 C), temperature source Oral, resp. rate 18, height 5\' 10"  (1.778 m), weight 90.8 kg (200 lb 1.6 oz), SpO2 98 %.  GENERAL:  72 y.o.-year-old patient lying in the bed with no acute  distress.  He looks drowsy, intoxicated. EYES: Pupils equal, round, reactive to light and accommodation. No scleral icterus. Extraocular muscles intact.  HEENT: There is a contusion to bridge of the nose; dried blood is noticed on the face. NECK:  Supple, no jugular venous distention. No thyroid enlargement, no tenderness.  LUNGS: Reduced breath sounds bilaterally, no wheezing. No use of accessory muscles of respiration.  CARDIOVASCULAR: S1, S2 normal. No S3/S4.  ABDOMEN: Soft, nontender, nondistended. Bowel sounds present. No organomegaly or mass.  EXTREMITIES: No pedal edema, cyanosis, or clubbing.  NEUROLOGIC: Cranial nerves II through XII are intact. Muscle strength 5/5 in all extremities. Sensation intact. PSYCHIATRIC: The patient is alert and oriented x 3, although his drowsy, intoxicated.  SKIN: No obvious rash, lesion, or ulcer.   LABORATORY PANEL:   CBC Recent Labs  Lab 09/18/17 1925  WBC 11.1*  HGB 14.9  HCT 42.6  PLT 180  MCV 110.2*  MCH 38.5*  MCHC 34.9  RDW 12.8  LYMPHSABS 4.0*  MONOABS 0.9  EOSABS 0.1  BASOSABS 0.0   ------------------------------------------------------------------------------------------------------------------  Chemistries  Recent Labs  Lab 09/18/17 1925  NA 129*  K 4.4  CL 99  CO2 18*  GLUCOSE 100*  BUN 50*  CREATININE 5.10*  CALCIUM 9.5  AST 72*  ALT 76*  ALKPHOS 98  BILITOT 0.7   ------------------------------------------------------------------------------------------------------------------ estimated creatinine clearance is 15.5 mL/min (A) (by C-G formula based on SCr of 5.1 mg/dL (H)). ------------------------------------------------------------------------------------------------------------------ No results for input(s): TSH, T4TOTAL, T3FREE, THYROIDAB in the last 72 hours.  Invalid input(s): FREET3   Coagulation profile No results for input(s): INR, PROTIME in the last 168  hours. ------------------------------------------------------------------------------------------------------------------- No results for input(s): DDIMER in the last 72 hours. -------------------------------------------------------------------------------------------------------------------  Cardiac Enzymes No results for input(s): CKMB, TROPONINI, MYOGLOBIN in the last 168 hours.  Invalid input(s): CK ------------------------------------------------------------------------------------------------------------------ Invalid input(s): POCBNP  ---------------------------------------------------------------------------------------------------------------  Urinalysis    Component Value Date/Time   COLORURINE YELLOW (A) 09/18/2017 2133   APPEARANCEUR CLEAR (A) 09/18/2017 2133   LABSPEC 1.006 09/18/2017 2133   PHURINE 6.0 09/18/2017 2133   GLUCOSEU 150 (A) 09/18/2017 2133   HGBUR MODERATE (A) 09/18/2017 2133   BILIRUBINUR NEGATIVE 09/18/2017 2133   KETONESUR NEGATIVE 09/18/2017 2133   PROTEINUR NEGATIVE 09/18/2017 2133   NITRITE NEGATIVE 09/18/2017 2133   LEUKOCYTESUR NEGATIVE 09/18/2017 2133     RADIOLOGY: Ct Head Wo Contrast  Result Date: 09/18/2017 CLINICAL DATA:  Fall face first at home. EXAM: CT HEAD WITHOUT CONTRAST CT MAXILLOFACIAL WITHOUT CONTRAST CT CERVICAL SPINE WITHOUT CONTRAST TECHNIQUE: Multidetector CT imaging of the head, cervical spine, and maxillofacial structures were performed using the standard protocol without intravenous contrast. Multiplanar CT image reconstructions of the cervical spine and maxillofacial structures were also generated. COMPARISON:  MR cervical spine dated October 03, 2014. FINDINGS: CT HEAD FINDINGS Brain: No evidence of acute infarction, hemorrhage, hydrocephalus, extra-axial collection or  mass lesion/mass effect. Mild generalized cerebral atrophy. Scattered mild periventricular and subcortical white matter hypodensities are nonspecific, but  favored to reflect chronic microvascular ischemic changes. Vascular: Calcified atherosclerosis at the skullbase. No hyperdense vessel. Dolichoectasia of the proximal basilar artery. Skull: Negative for fracture or focal lesion. Other: None. CT MAXILLOFACIAL FINDINGS Osseous: Minimally depressed fracture of the nasal bone. Fracture of the right maxillary central incisor root. Absent left maxillary central incisor. Orbits: Negative. No traumatic or inflammatory finding. Sinuses: Clear. Soft tissues: Nasal soft tissue swelling. CT CERVICAL SPINE FINDINGS Alignment: Unchanged degenerative 2 mm anterolisthesis at C5-C6. No traumatic malalignment. Skull base and vertebrae: No acute fracture. No primary bone lesion or focal pathologic process. Soft tissues and spinal canal: No prevertebral fluid or swelling. No visible canal hematoma. Disc levels: Severe multilevel degenerative changes throughout the cervical spine. Upper chest: Negative. Other: None. IMPRESSION: 1. Minimally depressed fracture of the nasal bone with overlying soft tissue swelling. 2. Fracture of the right maxillary central incisor root. Absent left maxillary central incisor. 3. No acute intracranial abnormality. Atrophy and chronic microvascular ischemic changes. 4. No acute cervical spine fracture. Severe multilevel degenerative changes. Electronically Signed   By: Titus Dubin M.D.   On: 09/18/2017 20:12   Ct Cervical Spine Wo Contrast  Result Date: 09/18/2017 CLINICAL DATA:  Fall face first at home. EXAM: CT HEAD WITHOUT CONTRAST CT MAXILLOFACIAL WITHOUT CONTRAST CT CERVICAL SPINE WITHOUT CONTRAST TECHNIQUE: Multidetector CT imaging of the head, cervical spine, and maxillofacial structures were performed using the standard protocol without intravenous contrast. Multiplanar CT image reconstructions of the cervical spine and maxillofacial structures were also generated. COMPARISON:  MR cervical spine dated October 03, 2014. FINDINGS: CT HEAD  FINDINGS Brain: No evidence of acute infarction, hemorrhage, hydrocephalus, extra-axial collection or mass lesion/mass effect. Mild generalized cerebral atrophy. Scattered mild periventricular and subcortical white matter hypodensities are nonspecific, but favored to reflect chronic microvascular ischemic changes. Vascular: Calcified atherosclerosis at the skullbase. No hyperdense vessel. Dolichoectasia of the proximal basilar artery. Skull: Negative for fracture or focal lesion. Other: None. CT MAXILLOFACIAL FINDINGS Osseous: Minimally depressed fracture of the nasal bone. Fracture of the right maxillary central incisor root. Absent left maxillary central incisor. Orbits: Negative. No traumatic or inflammatory finding. Sinuses: Clear. Soft tissues: Nasal soft tissue swelling. CT CERVICAL SPINE FINDINGS Alignment: Unchanged degenerative 2 mm anterolisthesis at C5-C6. No traumatic malalignment. Skull base and vertebrae: No acute fracture. No primary bone lesion or focal pathologic process. Soft tissues and spinal canal: No prevertebral fluid or swelling. No visible canal hematoma. Disc levels: Severe multilevel degenerative changes throughout the cervical spine. Upper chest: Negative. Other: None. IMPRESSION: 1. Minimally depressed fracture of the nasal bone with overlying soft tissue swelling. 2. Fracture of the right maxillary central incisor root. Absent left maxillary central incisor. 3. No acute intracranial abnormality. Atrophy and chronic microvascular ischemic changes. 4. No acute cervical spine fracture. Severe multilevel degenerative changes. Electronically Signed   By: Titus Dubin M.D.   On: 09/18/2017 20:12   US Renal  Result Date: 09/18/2017 CLINICAL DATA:  73 year old male with acute kidney injury. EXAM: RENAL / URINARY TRACT ULTRASOUND COMPLETE COMPARISON:  Renal ultrasound dated 06/23/2017 FINDINGS: Right Kidney: Length: 10 cm. Mildly atrophic and echogenic. Trace perinephric fluid,  nonspecific. A 1.3 cm echogenic focus in the midpole may represent an area of calcification versus a nonobstructing stone. No hydronephrosis. Left Kidney: Length: 10 cm. Mildly atrophic and echogenic. No hydronephrosis or shadowing stone. An 8 mm interpolar cyst is noted.  Bladder: Appears normal for degree of bladder distention. Incidental note of an echogenic liver most consistent with fatty infiltration. IMPRESSION: 1. Mildly atrophic kidneys bilaterally. There is increased renal parenchymal echogenicity, likely related to underlying medical renal disease. Clinical correlation recommended. 2. A 1.3 cm focus of calcification versus a nonobstructing stone in the interpolar right kidney. No hydronephrosis. Electronically Signed   By: Anner Crete M.D.   On: 09/18/2017 23:07   Dg Chest Portable 1 View  Result Date: 09/18/2017 CLINICAL DATA:  Fall.  Evaluate for pneumonia and pneumothorax EXAM: PORTABLE CHEST 1 VIEW COMPARISON:  None. FINDINGS: Cardiomegaly. Left base atelectasis. Right lung clear. No effusions. No pneumothorax. No acute bony abnormality. IMPRESSION: Cardiomegaly.  Left base atelectasis. Electronically Signed   By: Rolm Baptise M.D.   On: 09/18/2017 19:49   Ct Maxillofacial Wo Contrast  Result Date: 09/18/2017 CLINICAL DATA:  Fall face first at home. EXAM: CT HEAD WITHOUT CONTRAST CT MAXILLOFACIAL WITHOUT CONTRAST CT CERVICAL SPINE WITHOUT CONTRAST TECHNIQUE: Multidetector CT imaging of the head, cervical spine, and maxillofacial structures were performed using the standard protocol without intravenous contrast. Multiplanar CT image reconstructions of the cervical spine and maxillofacial structures were also generated. COMPARISON:  MR cervical spine dated October 03, 2014. FINDINGS: CT HEAD FINDINGS Brain: No evidence of acute infarction, hemorrhage, hydrocephalus, extra-axial collection or mass lesion/mass effect. Mild generalized cerebral atrophy. Scattered mild periventricular and  subcortical white matter hypodensities are nonspecific, but favored to reflect chronic microvascular ischemic changes. Vascular: Calcified atherosclerosis at the skullbase. No hyperdense vessel. Dolichoectasia of the proximal basilar artery. Skull: Negative for fracture or focal lesion. Other: None. CT MAXILLOFACIAL FINDINGS Osseous: Minimally depressed fracture of the nasal bone. Fracture of the right maxillary central incisor root. Absent left maxillary central incisor. Orbits: Negative. No traumatic or inflammatory finding. Sinuses: Clear. Soft tissues: Nasal soft tissue swelling. CT CERVICAL SPINE FINDINGS Alignment: Unchanged degenerative 2 mm anterolisthesis at C5-C6. No traumatic malalignment. Skull base and vertebrae: No acute fracture. No primary bone lesion or focal pathologic process. Soft tissues and spinal canal: No prevertebral fluid or swelling. No visible canal hematoma. Disc levels: Severe multilevel degenerative changes throughout the cervical spine. Upper chest: Negative. Other: None. IMPRESSION: 1. Minimally depressed fracture of the nasal bone with overlying soft tissue swelling. 2. Fracture of the right maxillary central incisor root. Absent left maxillary central incisor. 3. No acute intracranial abnormality. Atrophy and chronic microvascular ischemic changes. 4. No acute cervical spine fracture. Severe multilevel degenerative changes. Electronically Signed   By: Titus Dubin M.D.   On: 09/18/2017 20:12    EKG: Orders placed or performed in visit on 09/18/17  . EKG 12-Lead    IMPRESSION AND PLAN:  1.  Syncopal episode, likely related to alcohol intoxication and acute renal failure.  Continue to monitor patient on telemetry, to evaluate for cardiac cause.  Will check 2D echo. 2.  ARF/CKD3, likely multifactorial with some component, being prerenal, due to poor p.o. fluid intake.  His HIV medication could also cause some degree of kidney failure.  We will check renal ultrasound and  UA.  We will start IV fluid resuscitation and monitor kidney function closely.  Avoid nephrotoxic medications.  Nephrology is consulted for further evaluation and treatment. 3.  Alcohol intoxication will monitor patient clinically closely,  for withdrawal symptoms.  Will start thiamine supplement.  4.  Alcohol abuse and dependence.  Patient is advised to quit alcohol use, but he has a very hard time doing that because of withdrawal  symptoms.  He tells me that if he does not drink every day he "does not feel good".  Will monitor patient clinically closely and start CIWA protocol, which includes thiamine supplement.  He will need social support at discharge. 5.  Nasal bone fracture status post fall, continue supportive measures. 6.  HIV, compliant with his medication, Biktarvy.  Will hold Biktarvy for now due to low GFR and possible side effects, renal failure.  Infectious disease is consulted for further evaluation and treatment.     All the records are reviewed and case discussed with ED provider. Management plans discussed with the patient, who is in agreement.  CODE STATUS: Full    Code Status Orders  (From admission, onward)        Start     Ordered   09/18/17 2255  Full code  Continuous     09/18/17 2256    Code Status History    This patient has a current code status but no historical code status.       TOTAL TIME TAKING CARE OF THIS PATIENT: 45 minutes.    Amelia Jo M.D on 09/18/2017 at 11:32 PM  Between 7am to 6pm - Pager - [phone]  After 6pm go to [email or web address] - password [email or web address] [facility]  Office  [phone]  CC: Primary care physician; Theotis Burrow, MD

## 2017-09-19 ENCOUNTER — Other Ambulatory Visit: Payer: Self-pay

## 2017-09-19 ENCOUNTER — Inpatient Hospital Stay
Admit: 2017-09-19 | Discharge: 2017-09-19 | Disposition: A | Discharge status: Home / Self Care | Payer: Medicare Other | Attending: Internal Medicine | Admitting: Internal Medicine

## 2017-09-19 LAB — CBC
HCT: 40.4 % (ref 40.0–52.0)
Hemoglobin: 14.2 g/dL (ref 13.0–18.0)
MCH: 38.6 pg — ABNORMAL HIGH (ref 26.0–34.0)
MCHC: 35.1 g/dL (ref 32.0–36.0)
MCV: 109.9 fL — ABNORMAL HIGH (ref 80.0–100.0)
Platelets: 130 10*3/uL — ABNORMAL LOW (ref 150–440)
RBC: 3.68 MIL/uL — ABNORMAL LOW (ref 4.40–5.90)
RDW: 12.7 % (ref 11.5–14.5)
WBC: 9.9 10*3/uL (ref 3.8–10.6)

## 2017-09-19 LAB — COMPREHENSIVE METABOLIC PANEL
ALT: 66 U/L — ABNORMAL HIGH (ref 0–44)
AST: 63 U/L — ABNORMAL HIGH (ref 15–41)
Albumin: 3.6 g/dL (ref 3.5–5.0)
Alkaline Phosphatase: 90 U/L (ref 38–126)
Anion gap: 9 (ref 5–15)
BUN: 38 mg/dL — ABNORMAL HIGH (ref 8–23)
CO2: 19 mmol/L — ABNORMAL LOW (ref 22–32)
Calcium: 8.7 mg/dL — ABNORMAL LOW (ref 8.9–10.3)
Chloride: 109 mmol/L (ref 98–111)
Creatinine, Ser: 4.24 mg/dL — ABNORMAL HIGH (ref 0.61–1.24)
GFR calc Af Amer: 15 mL/min — ABNORMAL LOW (ref >60)
GFR calc non Af Amer: 13 mL/min — ABNORMAL LOW (ref >60)
Glucose, Bld: 82 mg/dL (ref 70–99)
Potassium: 4.8 mmol/L (ref 3.5–5.1)
Sodium: 137 mmol/L (ref 135–145)
Total Bilirubin: 1.4 mg/dL — ABNORMAL HIGH (ref 0.3–1.2)
Total Protein: 6.3 g/dL — ABNORMAL LOW (ref 6.5–8.1)

## 2017-09-19 LAB — ECHOCARDIOGRAM COMPLETE
Height: 70 in
Weight: 3196.8 oz

## 2017-09-19 LAB — GLUCOSE, CAPILLARY: Glucose-Capillary: 76 mg/dL (ref 70–99)

## 2017-09-19 MED ORDER — PANTOPRAZOLE SODIUM 40 MG PO TBEC: 40.0000 mg | DELAYED_RELEASE_TABLET | Freq: Every day | ORAL | Status: DC

## 2017-09-19 MED ORDER — GUAIFENESIN 100 MG/5ML PO SOLN
5.0000 mL | ORAL | Status: DC | PRN
  Administered 2017-09-19 – 2017-09-23 (×9): 100 mg via ORAL
  Filled 2017-09-19 (×11): qty 5

## 2017-09-19 MED ORDER — PANTOPRAZOLE SODIUM 40 MG PO TBEC
40.0000 mg | DELAYED_RELEASE_TABLET | Freq: Every day | ORAL | Status: DC
  Administered 2017-09-19 – 2017-09-23 (×6): 40 mg via ORAL
  Filled 2017-09-19 (×6): qty 1

## 2017-09-19 MED ORDER — NYSTATIN 100000 UNIT/GM EX OINT
TOPICAL_OINTMENT | Freq: Two times a day (BID) | CUTANEOUS | Status: DC
  Administered 2017-09-20 – 2017-09-22 (×3): via TOPICAL
  Filled 2017-09-19 (×2): qty 15

## 2017-09-19 NOTE — Consult Note (Signed)
[facility]  CONSULT NOTE    Date: 09/19/2017                  Patient Name:  Anthony Olson.  MRN: [id number]  DOB: 1945-12-21  Age / Sex: 72 y.o., male         PCP: Revelo, Elyse Jarvis, MD                 Service Requesting Consult: Dr. Brett Albino                 Reason for Consult: Acute renal failure            History of Present Illness: Anthony Olson. is a 72 y.o. white male with HIV, chronic kidney disease stage III, who was admitted to [facility] on 09/18/2017 for Syncope and collapse [R55] AKI (acute kidney injury) ([place]) [N17.9] Hypotension due to hypovolemia [I95.89, E86.1]   Patient was given bactrim from groin rash. Patient had a fall yesterday and broke his nose. He endorses loss of consciousness. He has been drinking alcohol.   Creatinine on admission of 5.1 from baseline creatinine of 1.67, GFR of 41 on 05/19/17. Follows with Dr. Radene Knee, [facility].   Only home medication is his HAART and lisinopril.   Medications: Outpatient medications: Medications Prior to Admission  Medication Sig Dispense Refill Last Dose  . bictegravir-emtricitabine-tenofovir AF (BIKTARVY) 50-200-25 MG TABS tablet Take 1 tablet by mouth daily.    Taking  . lisinopril (PRINIVIL,ZESTRIL) 10 MG tablet Take by mouth.   Taking    Current medications: Current Facility-Administered Medications  Medication Dose Route Frequency Provider Last Rate Last Dose  . 0.9 %  sodium chloride infusion   Intravenous Continuous Amelia Jo, MD 100 mL/hr at 09/19/17 6063    . acetaminophen (TYLENOL) tablet 650 mg  650 mg Oral Q6H PRN Amelia Jo, MD       Or  . acetaminophen (TYLENOL) suppository 650 mg  650 mg Rectal Q6H PRN Amelia Jo, MD      . bisacodyl (DULCOLAX) EC tablet 5 mg  5 mg Oral Daily PRN Amelia Jo, MD      . docusate sodium (COLACE) capsule 100 mg  100 mg Oral BID Amelia Jo, MD   100 mg at 09/19/17 1020  . folic acid (FOLVITE) tablet 1 mg  1 mg Oral  Daily Amelia Jo, MD   1 mg at 09/19/17 1020  . heparin injection 5,000 Units  5,000 Units Subcutaneous Q8H Amelia Jo, MD   5,000 Units at 09/19/17 0531  . HYDROcodone-acetaminophen (NORCO/VICODIN) 5-325 MG per tablet 1-2 tablet  1-2 tablet Oral Q4H PRN Amelia Jo, MD   1 tablet at 09/19/17 1020  . LORazepam (ATIVAN) injection 0-4 mg  0-4 mg Intravenous Q6H Amelia Jo, MD       Followed by  . [START ON 09/20/2017] LORazepam (ATIVAN) injection 0-4 mg  0-4 mg Intravenous Q12H Amelia Jo, MD      . LORazepam (ATIVAN) tablet 1 mg  1 mg Oral Q6H PRN Amelia Jo, MD       Or  . LORazepam (ATIVAN) injection 1 mg  1 mg Intravenous Q6H PRN Amelia Jo, MD      . multivitamin with minerals tablet 1 tablet  1 tablet Oral Daily Amelia Jo, MD   1 tablet at 09/19/17 1020  . ondansetron (ZOFRAN) tablet 4 mg  4 mg Oral Q6H PRN Amelia Jo, MD  Or  . ondansetron (ZOFRAN) injection 4 mg  4 mg Intravenous Q6H PRN Amelia Jo, MD      . pantoprazole (PROTONIX) EC tablet 40 mg  40 mg Oral Daily Amelia Jo, MD   40 mg at 09/19/17 1020  . thiamine (VITAMIN B-1) tablet 100 mg  100 mg Oral Daily Amelia Jo, MD   100 mg at 09/19/17 1020   Or  . thiamine (B-1) injection 100 mg  100 mg Intravenous Daily Amelia Jo, MD      . traZODone (DESYREL) tablet 25 mg  25 mg Oral QHS PRN Amelia Jo, MD   25 mg at 09/19/17 0022      Allergies: No Known Allergies    Past Medical History: Past Medical History:  Diagnosis Date  . Hernia, abdominal   . HIV infection (HCC)    Sees Dr. Ola Spurr for this  . Inguinal hernia   . Kidney failure   . Neoplasm of skin      Past Surgical History: Past Surgical History:  Procedure Laterality Date  . CARPAL TUNNEL RELEASE Left   . HERNIA REPAIR  1997   Left Lower Abdomen- [place], [place]  . HERNIA REPAIR  7902   [place]  . INGUINAL HERNIA REPAIR Left 1996   [place]  . INGUINAL HERNIA REPAIR Right 1993    [place]  . LAMINECTOMY    . OPEN ANTERIOR SHOULDER RECONSTRUCTION Right 08/2013   [facility]  . REPLACEMENT TOTAL KNEE Right   . REPLACEMENT TOTAL KNEE Left    [facility]  . UMBILICAL HERNIA REPAIR  1997   [place]     Family History: Family History  Problem Relation Age of Onset  . Diabetes Mother   . CAD Father 21     Social History: Social History   Socioeconomic History  . Marital status: Single    Spouse name: Not on file  . Number of children: Not on file  . Years of education: Not on file  . Highest education level: Not on file  Occupational History  . Not on file  Social Needs  . Financial resource strain: Not on file  . Food insecurity:    Worry: Not on file    Inability: Not on file  . Transportation needs:    Medical: Not on file    Non-medical: Not on file  Tobacco Use  . Smoking status: Former Smoker    Last attempt to quit: 06/18/2013    Years since quitting: 4.2  . Smokeless tobacco: Never Used  Substance and Sexual Activity  . Alcohol use: Yes    Comment: Pt states he drinks 1/5 day   . Drug use: No    Comment: Use to do cocaine 30 years ago  . Sexual activity: Not on file  Lifestyle  . Physical activity:    Days per week: Not on file    Minutes per session: Not on file  . Stress: Not on file  Relationships  . Social connections:    Talks on phone: Not on file    Gets together: Not on file    Attends religious service: Not on file    Active member of club or organization: Not on file    Attends meetings of clubs or organizations: Not on file    Relationship status: Not on file  . Intimate partner violence:    Fear of current or ex partner: Not on file    Emotionally abused: Not on file  Physically abused: Not on file    Forced sexual activity: Not on file  Other Topics Concern  . Not on file  Social History Narrative  . Not on file     Review of Systems: Review of Systems  Constitutional: Negative.  Negative for chills,  diaphoresis, fever, malaise/fatigue and weight loss.  HENT: Negative.  Negative for congestion, ear discharge, ear pain, hearing loss, nosebleeds, sinus pain, sore throat and tinnitus.   Eyes: Negative.  Negative for blurred vision, double vision, photophobia, pain, discharge and redness.  Respiratory: Negative.  Negative for cough, hemoptysis, sputum production, shortness of breath and wheezing.   Cardiovascular: Negative.  Negative for chest pain, palpitations, orthopnea, claudication, leg swelling and PND.  Gastrointestinal: Negative.  Negative for abdominal pain, blood in stool, constipation, diarrhea, heartburn, melena, nausea and vomiting.  Genitourinary: Negative.  Negative for dysuria, flank pain, frequency, hematuria and urgency.  Musculoskeletal: Negative.  Negative for back pain, falls, joint pain, myalgias and neck pain.  Skin: Negative.  Negative for itching and rash.  Neurological: Positive for dizziness, tingling, loss of consciousness and headaches. Negative for tremors, sensory change, speech change, focal weakness, seizures and weakness.  Endo/Heme/Allergies: Negative.  Negative for environmental allergies and polydipsia. Does not bruise/bleed easily.  Psychiatric/Behavioral: Negative.  Negative for depression, hallucinations, memory loss, substance abuse and suicidal ideas. The patient is not nervous/anxious and does not have insomnia.     Vital Signs: Blood pressure 115/78, pulse (!) 103, temperature 97.9 F (36.6 C), temperature source Oral, resp. rate 16, height 5\' 10"  (1.778 m), weight 90.6 kg (199 lb 12.8 oz), SpO2 97 %.  Weight trends: Filed Weights   09/18/17 1926 09/18/17 2304 09/19/17 0449  Weight: 91.2 kg (201 lb) 90.8 kg (200 lb 1.6 oz) 90.6 kg (199 lb 12.8 oz)    Physical Exam: General: NAD, laying in bed  Head: +traumatic - nose with ecchymosis  Eyes: Anicteric, PERRL  Neck: Supple, trachea midline  Lungs:  Clear to auscultation  Heart: Regular rate and  rhythm  Abdomen:  Soft, nontender,   Extremities: no peripheral edema.  Neurologic: Nonfocal, moving all four extremities  Skin: No lesions         Lab results: Basic Metabolic Panel: Recent Labs  Lab 09/18/17 1925 09/19/17 0428  NA 129* 137  K 4.4 4.8  CL 99 109  CO2 18* 19*  GLUCOSE 100* 82  BUN 50* 38*  CREATININE 5.10* 4.24*  CALCIUM 9.5 8.7*    Liver Function Tests: Recent Labs  Lab 09/18/17 1925 09/19/17 0428  AST 72* 63*  ALT 76* 66*  ALKPHOS 98 90  BILITOT 0.7 1.4*  PROT 6.9 6.3*  ALBUMIN 4.1 3.6   No results for input(s): LIPASE, AMYLASE in the last 168 hours. No results for input(s): AMMONIA in the last 168 hours.  CBC: Recent Labs  Lab 09/18/17 1925 09/19/17 0428  WBC 11.1* 9.9  NEUTROABS 6.0  --   HGB 14.9 14.2  HCT 42.6 40.4  MCV 110.2* 109.9*  PLT 180 130*    Cardiac Enzymes: No results for input(s): CKTOTAL, CKMB, CKMBINDEX, TROPONINI in the last 168 hours.  BNP: Invalid input(s): POCBNP  CBG: Recent Labs  Lab 09/18/17 2313 09/19/17 6195  KDTOIZ 12 45    Microbiology: No results found for this or any previous visit.  Coagulation Studies: No results for input(s): LABPROT, INR in the last 72 hours.  Urinalysis: Recent Labs    09/18/17 2133  COLORURINE YELLOW*  LABSPEC 1.006  PHURINE 6.0  GLUCOSEU 150*  HGBUR MODERATE*  BILIRUBINUR NEGATIVE  KETONESUR NEGATIVE  PROTEINUR NEGATIVE  NITRITE NEGATIVE  LEUKOCYTESUR NEGATIVE      Imaging: Ct Head Wo Contrast  Result Date: 09/18/2017 CLINICAL DATA:  Fall face first at home. EXAM: CT HEAD WITHOUT CONTRAST CT MAXILLOFACIAL WITHOUT CONTRAST CT CERVICAL SPINE WITHOUT CONTRAST TECHNIQUE: Multidetector CT imaging of the head, cervical spine, and maxillofacial structures were performed using the standard protocol without intravenous contrast. Multiplanar CT image reconstructions of the cervical spine and maxillofacial structures were also generated. COMPARISON:  MR cervical  spine dated October 03, 2014. FINDINGS: CT HEAD FINDINGS Brain: No evidence of acute infarction, hemorrhage, hydrocephalus, extra-axial collection or mass lesion/mass effect. Mild generalized cerebral atrophy. Scattered mild periventricular and subcortical white matter hypodensities are nonspecific, but favored to reflect chronic microvascular ischemic changes. Vascular: Calcified atherosclerosis at the skullbase. No hyperdense vessel. Dolichoectasia of the proximal basilar artery. Skull: Negative for fracture or focal lesion. Other: None. CT MAXILLOFACIAL FINDINGS Osseous: Minimally depressed fracture of the nasal bone. Fracture of the right maxillary central incisor root. Absent left maxillary central incisor. Orbits: Negative. No traumatic or inflammatory finding. Sinuses: Clear. Soft tissues: Nasal soft tissue swelling. CT CERVICAL SPINE FINDINGS Alignment: Unchanged degenerative 2 mm anterolisthesis at C5-C6. No traumatic malalignment. Skull base and vertebrae: No acute fracture. No primary bone lesion or focal pathologic process. Soft tissues and spinal canal: No prevertebral fluid or swelling. No visible canal hematoma. Disc levels: Severe multilevel degenerative changes throughout the cervical spine. Upper chest: Negative. Other: None. IMPRESSION: 1. Minimally depressed fracture of the nasal bone with overlying soft tissue swelling. 2. Fracture of the right maxillary central incisor root. Absent left maxillary central incisor. 3. No acute intracranial abnormality. Atrophy and chronic microvascular ischemic changes. 4. No acute cervical spine fracture. Severe multilevel degenerative changes. Electronically Signed   By: Titus Dubin M.D.   On: 09/18/2017 20:12   Ct Cervical Spine Wo Contrast  Result Date: 09/18/2017 CLINICAL DATA:  Fall face first at home. EXAM: CT HEAD WITHOUT CONTRAST CT MAXILLOFACIAL WITHOUT CONTRAST CT CERVICAL SPINE WITHOUT CONTRAST TECHNIQUE: Multidetector CT imaging of the head,  cervical spine, and maxillofacial structures were performed using the standard protocol without intravenous contrast. Multiplanar CT image reconstructions of the cervical spine and maxillofacial structures were also generated. COMPARISON:  MR cervical spine dated October 03, 2014. FINDINGS: CT HEAD FINDINGS Brain: No evidence of acute infarction, hemorrhage, hydrocephalus, extra-axial collection or mass lesion/mass effect. Mild generalized cerebral atrophy. Scattered mild periventricular and subcortical white matter hypodensities are nonspecific, but favored to reflect chronic microvascular ischemic changes. Vascular: Calcified atherosclerosis at the skullbase. No hyperdense vessel. Dolichoectasia of the proximal basilar artery. Skull: Negative for fracture or focal lesion. Other: None. CT MAXILLOFACIAL FINDINGS Osseous: Minimally depressed fracture of the nasal bone. Fracture of the right maxillary central incisor root. Absent left maxillary central incisor. Orbits: Negative. No traumatic or inflammatory finding. Sinuses: Clear. Soft tissues: Nasal soft tissue swelling. CT CERVICAL SPINE FINDINGS Alignment: Unchanged degenerative 2 mm anterolisthesis at C5-C6. No traumatic malalignment. Skull base and vertebrae: No acute fracture. No primary bone lesion or focal pathologic process. Soft tissues and spinal canal: No prevertebral fluid or swelling. No visible canal hematoma. Disc levels: Severe multilevel degenerative changes throughout the cervical spine. Upper chest: Negative. Other: None. IMPRESSION: 1. Minimally depressed fracture of the nasal bone with overlying soft tissue swelling. 2. Fracture of the right maxillary central incisor root. Absent left maxillary central incisor. 3. No acute  intracranial abnormality. Atrophy and chronic microvascular ischemic changes. 4. No acute cervical spine fracture. Severe multilevel degenerative changes. Electronically Signed   By: Titus Dubin M.D.   On: 09/18/2017 20:12    US Renal  Result Date: 09/18/2017 CLINICAL DATA:  72 year old male with acute kidney injury. EXAM: RENAL / URINARY TRACT ULTRASOUND COMPLETE COMPARISON:  Renal ultrasound dated 06/23/2017 FINDINGS: Right Kidney: Length: 10 cm. Mildly atrophic and echogenic. Trace perinephric fluid, nonspecific. A 1.3 cm echogenic focus in the midpole may represent an area of calcification versus a nonobstructing stone. No hydronephrosis. Left Kidney: Length: 10 cm. Mildly atrophic and echogenic. No hydronephrosis or shadowing stone. An 8 mm interpolar cyst is noted. Bladder: Appears normal for degree of bladder distention. Incidental note of an echogenic liver most consistent with fatty infiltration. IMPRESSION: 1. Mildly atrophic kidneys bilaterally. There is increased renal parenchymal echogenicity, likely related to underlying medical renal disease. Clinical correlation recommended. 2. A 1.3 cm focus of calcification versus a nonobstructing stone in the interpolar right kidney. No hydronephrosis. Electronically Signed   By: Anner Crete M.D.   On: 09/18/2017 23:07   Dg Chest Portable 1 View  Result Date: 09/18/2017 CLINICAL DATA:  Fall.  Evaluate for pneumonia and pneumothorax EXAM: PORTABLE CHEST 1 VIEW COMPARISON:  None. FINDINGS: Cardiomegaly. Left base atelectasis. Right lung clear. No effusions. No pneumothorax. No acute bony abnormality. IMPRESSION: Cardiomegaly.  Left base atelectasis. Electronically Signed   By: Rolm Baptise M.D.   On: 09/18/2017 19:49   Ct Maxillofacial Wo Contrast  Result Date: 09/18/2017 CLINICAL DATA:  Fall face first at home. EXAM: CT HEAD WITHOUT CONTRAST CT MAXILLOFACIAL WITHOUT CONTRAST CT CERVICAL SPINE WITHOUT CONTRAST TECHNIQUE: Multidetector CT imaging of the head, cervical spine, and maxillofacial structures were performed using the standard protocol without intravenous contrast. Multiplanar CT image reconstructions of the cervical spine and maxillofacial structures were  also generated. COMPARISON:  MR cervical spine dated October 03, 2014. FINDINGS: CT HEAD FINDINGS Brain: No evidence of acute infarction, hemorrhage, hydrocephalus, extra-axial collection or mass lesion/mass effect. Mild generalized cerebral atrophy. Scattered mild periventricular and subcortical white matter hypodensities are nonspecific, but favored to reflect chronic microvascular ischemic changes. Vascular: Calcified atherosclerosis at the skullbase. No hyperdense vessel. Dolichoectasia of the proximal basilar artery. Skull: Negative for fracture or focal lesion. Other: None. CT MAXILLOFACIAL FINDINGS Osseous: Minimally depressed fracture of the nasal bone. Fracture of the right maxillary central incisor root. Absent left maxillary central incisor. Orbits: Negative. No traumatic or inflammatory finding. Sinuses: Clear. Soft tissues: Nasal soft tissue swelling. CT CERVICAL SPINE FINDINGS Alignment: Unchanged degenerative 2 mm anterolisthesis at C5-C6. No traumatic malalignment. Skull base and vertebrae: No acute fracture. No primary bone lesion or focal pathologic process. Soft tissues and spinal canal: No prevertebral fluid or swelling. No visible canal hematoma. Disc levels: Severe multilevel degenerative changes throughout the cervical spine. Upper chest: Negative. Other: None. IMPRESSION: 1. Minimally depressed fracture of the nasal bone with overlying soft tissue swelling. 2. Fracture of the right maxillary central incisor root. Absent left maxillary central incisor. 3. No acute intracranial abnormality. Atrophy and chronic microvascular ischemic changes. 4. No acute cervical spine fracture. Severe multilevel degenerative changes. Electronically Signed   By: Titus Dubin M.D.   On: 09/18/2017 20:12      Assessment & Plan: Mr. Lior Hoen. is a 72 y.o. white male with HIV, chronic kidney disease stage III, who was admitted to [facility] on 09/18/2017 for acute renal failure  1. 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 Hypertension: blood pressure at goal.  - holding lisinopril  3. HIV: undetectable.  - HAART   LOS: 1 Caliegh [place]ff 7/19/201910:55 AM

## 2017-09-19 NOTE — Progress Notes (Addendum)
[facility] NAME: Anthony Olson    MR#:  [id number]  DATE OF BIRTH:  04/14/1945  SUBJECTIVE:  Patient having concerns about his nose and teeth today. States he had a few teeth knocked out and some of them feel loose. No headaches, no chest pain, no palpitations.   REVIEW OF SYSTEMS:  Review of Systems  Constitutional: Negative for chills and fever.  Eyes: Negative for blurred vision and double vision.  Respiratory: Negative for cough and shortness of breath.   Cardiovascular: Negative for chest pain, palpitations and leg swelling.  Gastrointestinal: Negative for nausea and vomiting.  Genitourinary: Negative for dysuria.  Musculoskeletal: Positive for falls. Negative for joint pain.  Neurological: Negative for dizziness and headaches.      DRUG ALLERGIES:  No Known Allergies VITALS:  Blood pressure 115/78, pulse (!) 103, temperature 97.9 F (36.6 C), temperature source Oral, resp. rate 16, height 5\' 10"  (1.778 m), weight 90.6 kg (199 lb 12.8 oz), SpO2 97 %. PHYSICAL EXAMINATION:  Physical Exam  GENERAL:  72 y.o.-year-old patient lying in the bed with no acute distress. Pleasant and conversational. EYES: Pupils equal, round, reactive to light and accommodation. No scleral icterus. Extraocular muscles intact.  HEENT: There is a contusion with dried blood on the nasal bridge with some mild swelling, moist mucous membranes NECK:  Supple, no jugular venous distention. No thyroid enlargement, no tenderness.  LUNGS: Normal work of breathing, no wheezing or crackles CARDIOVASCULAR: S1, S2 normal. No S3/S4.  ABDOMEN: Soft, nontender, nondistended. Bowel sounds present. No organomegaly or mass.  EXTREMITIES: No pedal edema, cyanosis, or clubbing.  NEUROLOGIC: Cranial nerves II through XII are intact. Muscle strength 5/5 in all extremities. Sensation intact.  PSYCHIATRIC: A&O x 3, appropriate affect  SKIN: erythema and moisture present  in groin area.   LABORATORY PANEL:  Male CBC Recent Labs  Lab 09/19/17 0428  WBC 9.9  HGB 14.2  HCT 40.4  PLT 130*   ------------------------------------------------------------------------------------------------------------------ Chemistries  Recent Labs  Lab 09/19/17 0428  NA 137  K 4.8  CL 109  CO2 19*  GLUCOSE 82  BUN 38*  CREATININE 4.24*  CALCIUM 8.7*  AST 63*  ALT 66*  ALKPHOS 90  BILITOT 1.4*   RADIOLOGY:  Ct Head Wo Contrast  Result Date: 09/18/2017 CLINICAL DATA:  Fall face first at home. EXAM: CT HEAD WITHOUT CONTRAST CT MAXILLOFACIAL WITHOUT CONTRAST CT CERVICAL SPINE WITHOUT CONTRAST TECHNIQUE: Multidetector CT imaging of the head, cervical spine, and maxillofacial structures were performed using the standard protocol without intravenous contrast. Multiplanar CT image reconstructions of the cervical spine and maxillofacial structures were also generated. COMPARISON:  MR cervical spine dated October 03, 2014. FINDINGS: CT HEAD FINDINGS Brain: No evidence of acute infarction, hemorrhage, hydrocephalus, extra-axial collection or mass lesion/mass effect. Mild generalized cerebral atrophy. Scattered mild periventricular and subcortical white matter hypodensities are nonspecific, but favored to reflect chronic microvascular ischemic changes. Vascular: Calcified atherosclerosis at the skullbase. No hyperdense vessel. Dolichoectasia of the proximal basilar artery. Skull: Negative for fracture or focal lesion. Other: None. CT MAXILLOFACIAL FINDINGS Osseous: Minimally depressed fracture of the nasal bone. Fracture of the right maxillary central incisor root. Absent left maxillary central incisor. Orbits: Negative. No traumatic or inflammatory finding. Sinuses: Clear. Soft tissues: Nasal soft tissue swelling. CT CERVICAL SPINE FINDINGS Alignment: Unchanged degenerative 2 mm anterolisthesis at C5-C6. No traumatic malalignment. Skull base and vertebrae: No acute fracture. No primary  bone lesion or focal pathologic  process. Soft tissues and spinal canal: No prevertebral fluid or swelling. No visible canal hematoma. Disc levels: Severe multilevel degenerative changes throughout the cervical spine. Upper chest: Negative. Other: None. IMPRESSION: 1. Minimally depressed fracture of the nasal bone with overlying soft tissue swelling. 2. Fracture of the right maxillary central incisor root. Absent left maxillary central incisor. 3. No acute intracranial abnormality. Atrophy and chronic microvascular ischemic changes. 4. No acute cervical spine fracture. Severe multilevel degenerative changes. Electronically Signed   By: Titus Dubin M.D.   On: 09/18/2017 20:12   Ct Cervical Spine Wo Contrast  Result Date: 09/18/2017 CLINICAL DATA:  Fall face first at home. EXAM: CT HEAD WITHOUT CONTRAST CT MAXILLOFACIAL WITHOUT CONTRAST CT CERVICAL SPINE WITHOUT CONTRAST TECHNIQUE: Multidetector CT imaging of the head, cervical spine, and maxillofacial structures were performed using the standard protocol without intravenous contrast. Multiplanar CT image reconstructions of the cervical spine and maxillofacial structures were also generated. COMPARISON:  MR cervical spine dated October 03, 2014. FINDINGS: CT HEAD FINDINGS Brain: No evidence of acute infarction, hemorrhage, hydrocephalus, extra-axial collection or mass lesion/mass effect. Mild generalized cerebral atrophy. Scattered mild periventricular and subcortical white matter hypodensities are nonspecific, but favored to reflect chronic microvascular ischemic changes. Vascular: Calcified atherosclerosis at the skullbase. No hyperdense vessel. Dolichoectasia of the proximal basilar artery. Skull: Negative for fracture or focal lesion. Other: None. CT MAXILLOFACIAL FINDINGS Osseous: Minimally depressed fracture of the nasal bone. Fracture of the right maxillary central incisor root. Absent left maxillary central incisor. Orbits: Negative. No traumatic or  inflammatory finding. Sinuses: Clear. Soft tissues: Nasal soft tissue swelling. CT CERVICAL SPINE FINDINGS Alignment: Unchanged degenerative 2 mm anterolisthesis at C5-C6. No traumatic malalignment. Skull base and vertebrae: No acute fracture. No primary bone lesion or focal pathologic process. Soft tissues and spinal canal: No prevertebral fluid or swelling. No visible canal hematoma. Disc levels: Severe multilevel degenerative changes throughout the cervical spine. Upper chest: Negative. Other: None. IMPRESSION: 1. Minimally depressed fracture of the nasal bone with overlying soft tissue swelling. 2. Fracture of the right maxillary central incisor root. Absent left maxillary central incisor. 3. No acute intracranial abnormality. Atrophy and chronic microvascular ischemic changes. 4. No acute cervical spine fracture. Severe multilevel degenerative changes. Electronically Signed   By: Titus Dubin M.D.   On: 09/18/2017 20:12   US Renal  Result Date: 09/18/2017 CLINICAL DATA:  72 year old male with acute kidney injury. EXAM: RENAL / URINARY TRACT ULTRASOUND COMPLETE COMPARISON:  Renal ultrasound dated 06/23/2017 FINDINGS: Right Kidney: Length: 10 cm. Mildly atrophic and echogenic. Trace perinephric fluid, nonspecific. A 1.3 cm echogenic focus in the midpole may represent an area of calcification versus a nonobstructing stone. No hydronephrosis. Left Kidney: Length: 10 cm. Mildly atrophic and echogenic. No hydronephrosis or shadowing stone. An 8 mm interpolar cyst is noted. Bladder: Appears normal for degree of bladder distention. Incidental note of an echogenic liver most consistent with fatty infiltration. IMPRESSION: 1. Mildly atrophic kidneys bilaterally. There is increased renal parenchymal echogenicity, likely related to underlying medical renal disease. Clinical correlation recommended. 2. A 1.3 cm focus of calcification versus a nonobstructing stone in the interpolar right kidney. No hydronephrosis.  Electronically Signed   By: Anner Crete M.D.   On: 09/18/2017 23:07   Dg Chest Portable 1 View  Result Date: 09/18/2017 CLINICAL DATA:  Fall.  Evaluate for pneumonia and pneumothorax EXAM: PORTABLE CHEST 1 VIEW COMPARISON:  None. FINDINGS: Cardiomegaly. Left base atelectasis. Right lung clear. No effusions. No pneumothorax. No acute bony abnormality.  IMPRESSION: Cardiomegaly.  Left base atelectasis. Electronically Signed   By: Rolm Baptise M.D.   On: 09/18/2017 19:49   Ct Maxillofacial Wo Contrast  Result Date: 09/18/2017 CLINICAL DATA:  Fall face first at home. EXAM: CT HEAD WITHOUT CONTRAST CT MAXILLOFACIAL WITHOUT CONTRAST CT CERVICAL SPINE WITHOUT CONTRAST TECHNIQUE: Multidetector CT imaging of the head, cervical spine, and maxillofacial structures were performed using the standard protocol without intravenous contrast. Multiplanar CT image reconstructions of the cervical spine and maxillofacial structures were also generated. COMPARISON:  MR cervical spine dated October 03, 2014. FINDINGS: CT HEAD FINDINGS Brain: No evidence of acute infarction, hemorrhage, hydrocephalus, extra-axial collection or mass lesion/mass effect. Mild generalized cerebral atrophy. Scattered mild periventricular and subcortical white matter hypodensities are nonspecific, but favored to reflect chronic microvascular ischemic changes. Vascular: Calcified atherosclerosis at the skullbase. No hyperdense vessel. Dolichoectasia of the proximal basilar artery. Skull: Negative for fracture or focal lesion. Other: None. CT MAXILLOFACIAL FINDINGS Osseous: Minimally depressed fracture of the nasal bone. Fracture of the right maxillary central incisor root. Absent left maxillary central incisor. Orbits: Negative. No traumatic or inflammatory finding. Sinuses: Clear. Soft tissues: Nasal soft tissue swelling. CT CERVICAL SPINE FINDINGS Alignment: Unchanged degenerative 2 mm anterolisthesis at C5-C6. No traumatic malalignment. Skull base  and vertebrae: No acute fracture. No primary bone lesion or focal pathologic process. Soft tissues and spinal canal: No prevertebral fluid or swelling. No visible canal hematoma. Disc levels: Severe multilevel degenerative changes throughout the cervical spine. Upper chest: Negative. Other: None. IMPRESSION: 1. Minimally depressed fracture of the nasal bone with overlying soft tissue swelling. 2. Fracture of the right maxillary central incisor root. Absent left maxillary central incisor. 3. No acute intracranial abnormality. Atrophy and chronic microvascular ischemic changes. 4. No acute cervical spine fracture. Severe multilevel degenerative changes. Electronically Signed   By: Titus Dubin M.D.   On: 09/18/2017 20:12   ASSESSMENT AND PLAN:   1.  Syncopal episode, likely related to alcohol intoxication and acute renal failure. - ECHO unremarkable with EF 60-65%. - telemetry    2.  ARF on CKD3- likely prerenal due to poor po intake in combination with recent Bactrim use. Cr 5.10 on admission, improved to 4.24 today. Baseline 1.7-1.9. - discussed with Dr. Johnnye Sima (ID) who does not think that patient's HIV meds are contributing to his ARF. Can hold for a couple of days but then would restart. - renal US without any signs of obstruction. - nephrology following - continue IVFs today - continue holding lisinopril - recheck bmp in the am  3.  Alcohol intoxication will monitor patient clinically closely, for withdrawal symptoms. - CIWA - thiamine supplement - will need social support at discharge  4.  Nasal bone fracture status post fall - continue supportive measures  5.  HIV, compliant with his medication, Biktarvy. - discussed with ID, who stated it would be okay to hold for a couple of days, but would then restart.  6. Microscopic hematuria- UA with moderate Hgb. No urinary symptoms. - Needs repeat UA as an outpatient  7. Rash in groin area- did not improve with 10 days of bactrim as an  outpatient. Does not look like cellulitis. Area is moist and red, so will treat empirically with nystatin to see if this helps. - monitor  8. Elevated liver transaminases- likely due to alcohol use. - will obtain hepatitis panel, as this has not been done since 2015   All the records are reviewed and case discussed with Care Management/Social Worker.  Management plans discussed with the patient, family and they are in agreement.  CODE STATUS: Full Code  TOTAL TIME TAKING CARE OF THIS PATIENT: 30 minutes.   More than 50% of the time was spent in counseling/coordination of care: YES  POSSIBLE D/C IN 2-3 DAYS, DEPENDING ON CLINICAL CONDITION.   Berna Spare Rumaisa Schnetzer M.D on 09/19/2017 at 2:49 PM  Between 7am to 6pm - Pager - [phone]  After 6pm go to [email or web address] - Proofreader  [facility]  Office  [phone]  CC: Primary care physician; Theotis Burrow, MD  Note: This dictation was prepared with Dragon dictation along with smaller phrase technology. Any transcriptional errors that result from this process are unintentional.

## 2017-09-19 NOTE — Progress Notes (Signed)
*  PRELIMINARY RESULTS* Echocardiogram 2D Echocardiogram has been performed.  Sherrie Sport 09/19/2017, 8:50 AM

## 2017-09-20 LAB — GLUCOSE, CAPILLARY: Glucose-Capillary: 99 mg/dL (ref 70–99)

## 2017-09-20 LAB — CBC
HCT: 36.3 % — ABNORMAL LOW (ref 40.0–52.0)
Hemoglobin: 12.8 g/dL — ABNORMAL LOW (ref 13.0–18.0)
MCH: 39.3 pg — ABNORMAL HIGH (ref 26.0–34.0)
MCHC: 35.4 g/dL (ref 32.0–36.0)
MCV: 111.1 fL — ABNORMAL HIGH (ref 80.0–100.0)
Platelets: 105 10*3/uL — ABNORMAL LOW (ref 150–440)
RBC: 3.27 MIL/uL — ABNORMAL LOW (ref 4.40–5.90)
RDW: 12.6 % (ref 11.5–14.5)
WBC: 5.9 10*3/uL (ref 3.8–10.6)

## 2017-09-20 LAB — BASIC METABOLIC PANEL
Anion gap: 4 — ABNORMAL LOW (ref 5–15)
BUN: 32 mg/dL — ABNORMAL HIGH (ref 8–23)
CO2: 22 mmol/L (ref 22–32)
Calcium: 8.1 mg/dL — ABNORMAL LOW (ref 8.9–10.3)
Chloride: 108 mmol/L (ref 98–111)
Creatinine, Ser: 3.12 mg/dL — ABNORMAL HIGH (ref 0.61–1.24)
GFR calc Af Amer: 22 mL/min — ABNORMAL LOW (ref >60)
GFR calc non Af Amer: 19 mL/min — ABNORMAL LOW (ref >60)
Glucose, Bld: 101 mg/dL — ABNORMAL HIGH (ref 70–99)
Potassium: 4.8 mmol/L (ref 3.5–5.1)
Sodium: 134 mmol/L — ABNORMAL LOW (ref 135–145)

## 2017-09-20 MED ORDER — MENTHOL 3 MG MT LOZG
1.0000 | LOZENGE | OROMUCOSAL | Status: DC | PRN
  Administered 2017-09-20 – 2017-09-22 (×3): 3 mg via ORAL
  Filled 2017-09-20: qty 9

## 2017-09-20 NOTE — Evaluation (Signed)
Physical Therapy Evaluation Patient Details Name: Anthony Olson. MRN: [id number] DOB: 03-04-1946 Today's Date: 09/20/2017   History of Present Illness  Kazmir Oki is a 72yo male who comes to [facility] after LOB, fall forward onto face, quesitonal syncope. Pt sustained multiple facial/dental fractures. PMH: HIV, kidney failure, ETOH abuse, bilat TKA, THA, rotator cuff surgery. At baseline pt lives alone, no self described balance deficits, but pt endorses about 5 falls in past 6 months with que4stionable syncope playin a role in each.   Clinical Impression  Pt admitted with above diagnosis. Pt currently with functional limitations due to the deficits listed below (see "PT Problem List"). Upon entry, pt in bed, no family/caregiver present. The pt is awake and agreeable to participate.  The pt is alert and oriented x3, pleasant, conversational, and following simple commands consistently. Pt able to tolerate AMB in hallway for 550+feet without LOB. Functional mobility assessment demonstrates increased effort/time requirements, poor tolerance, and need for physical assistance, whereas the patient performed these at a higher level of independence PTA. Pt will benefit from skilled PT intervention to increase independence and safety with basic mobility in preparation for discharge to the venue listed below.       Follow Up Recommendations Home health PT    Equipment Recommendations  None recommended by PT    Recommendations for Other Services       Precautions / Restrictions Precautions Precautions: Fall Restrictions Weight Bearing Restrictions: No      Mobility  Bed Mobility Overal bed mobility: Needs Assistance Bed Mobility: Supine to Sit;Sit to Supine     Supine to sit: Supervision Sit to supine: Supervision   General bed mobility comments: minimal physical assistance needed.   Transfers Overall transfer level: Needs assistance Equipment used: None Transfers: Sit to/from  Stand Sit to Stand: Supervision            Ambulation/Gait Ambulation/Gait assistance: Supervision Gait Distance (Feet): 505 Feet Assistive device: None Gait Pattern/deviations: Wide base of support Gait velocity: 0.71m/s   General Gait Details: subjective wobbliness, but overall appears generally safe.   Stairs            Wheelchair Mobility    Modified Rankin (Stroke Patients Only)       Balance Overall balance assessment: History of Falls;Modified Independent                                           Pertinent Vitals/Pain Pain Assessment: 0-10 Pain Score: 4  Pain Location: face and neck  Pain Descriptors / Indicators: Aching Pain Intervention(s): Limited activity within patient's tolerance    Home Living Family/patient expects to be discharged to:: Private residence Living Arrangements: Alone Available Help at Discharge: (son lives in [place] about 1 hours away) Type of Home: House Home Access: Stairs to enter   Technical brewer of Steps: 1 Home Layout: One level Home Equipment: None Additional Comments: has used a RW in the past after TKA     Prior Function Level of Independence: Independent               Hand Dominance        Extremity/Trunk Assessment                Communication   Communication: No difficulties  Cognition Arousal/Alertness: Awake/alert Behavior During Therapy: WFL for tasks assessed/performed Overall Cognitive Status: Within Functional Limits  for tasks assessed                                        General Comments      Exercises     Assessment/Plan    PT Assessment Patient needs continued PT services  PT Problem List Decreased activity tolerance;Decreased balance       PT Treatment Interventions Functional mobility training;Therapeutic exercise;Therapeutic activities;Balance training    PT Goals (Current goals can be found in the Care Plan  section)  Acute Rehab PT Goals Patient Stated Goal: return to independent mobility, improved balance.  PT Goal Formulation: With patient Time For Goal Achievement: 10/04/17 Potential to Achieve Goals: Good    Frequency Min 2X/week   Barriers to discharge        Co-evaluation               AM-PAC PT "6 Clicks" Daily Activity  Outcome Measure Difficulty turning over in bed (including adjusting bedclothes, sheets and blankets)?: A Little Difficulty moving from lying on back to sitting on the side of the bed? : A Little Difficulty sitting down on and standing up from a chair with arms (e.g., wheelchair, bedside commode, etc,.)?: A Little Help needed moving to and from a bed to chair (including a wheelchair)?: A Little Help needed walking in hospital room?: None Help needed climbing 3-5 steps with a railing? : A Little 6 Click Score: 19    End of Session Equipment Utilized During Treatment: Gait belt Activity Tolerance: Patient tolerated treatment well Patient left: in chair;with call bell/phone within reach Nurse Communication: Mobility status PT Visit Diagnosis: Difficulty in walking, not elsewhere classified (R26.2);History of falling (Z91.81);Unsteadiness on feet (R26.81)    Time: 5300-5110 PT Time Calculation (min) (ACUTE ONLY): 17 min   Charges:   PT Evaluation $PT Eval Moderate Complexity: 1 Mod     PT G Codes:        2:42 PM, 17-Oct-2017 Etta Grandchild, PT, DPT Physical Therapist - [facility]  [phone] ([place])    Buccola,Allan C 2017/10/17, 2:39 PM

## 2017-09-20 NOTE — Progress Notes (Signed)
[facility]  ROUNDING NOTE   Subjective:   Patient sitting up in bed eating breakfast. Complains of his loose tooth.   UOP 1685  Creatinine 3.12 (2.24) (5.1)  NS at 117mL/hr  Objective:  Vital signs in last 24 hours:  Temp:  [98.1 F (36.7 C)-98.7 F (37.1 C)] 98.3 F (36.8 C) (07/20 0746) Pulse Rate:  [82-89] 88 (07/20 0746) Resp:  [16-18] 16 (07/20 0746) BP: (114-130)/(77-88) 126/88 (07/20 0746) SpO2:  [94 %-99 %] 99 % (07/20 0746) Weight:  [91.4 kg (201 lb 6.4 oz)] 91.4 kg (201 lb 6.4 oz) (07/20 0413)  Weight change: 0.181 kg (6.4 oz) Filed Weights   09/18/17 2304 09/19/17 0449 09/20/17 0413  Weight: 90.8 kg (200 lb 1.6 oz) 90.6 kg (199 lb 12.8 oz) 91.4 kg (201 lb 6.4 oz)    Intake/Output: I/O last 3 completed shifts: In: 2542 [P.O.:120; I.V.:655; IV Piggyback:1000] Out: 2485 [Urine:2485]   Intake/Output this shift:  Total I/O In: -  Out: 575 [Urine:575]  Physical Exam: General: NAD,   Head: +fractured nose, +laceration, loose tooth  Eyes: Anicteric, PERRL  Neck: Supple, trachea midline  Lungs:  Clear to auscultation  Heart: Regular rate and rhythm  Abdomen:  Soft, nontender,   Extremities:  no peripheral edema.  Neurologic: Nonfocal, moving all four extremities  Skin: No lesions        Basic Metabolic Panel: Recent Labs  Lab 09/18/17 1925 09/19/17 0428 09/20/17 0641  NA 129* 137 134*  K 4.4 4.8 4.8  CL 99 109 108  CO2 18* 19* 22  GLUCOSE 100* 82 101*  BUN 50* 38* 32*  CREATININE 5.10* 4.24* 3.12*  CALCIUM 9.5 8.7* 8.1*    Liver Function Tests: Recent Labs  Lab 09/18/17 1925 09/19/17 0428  AST 72* 63*  ALT 76* 66*  ALKPHOS 98 90  BILITOT 0.7 1.4*  PROT 6.9 6.3*  ALBUMIN 4.1 3.6   No results for input(s): LIPASE, AMYLASE in the last 168 hours. No results for input(s): AMMONIA in the last 168 hours.  CBC: Recent Labs  Lab 09/18/17 1925 09/19/17 0428 09/20/17 0641  WBC 11.1* 9.9 5.9  NEUTROABS 6.0  --   --    HGB 14.9 14.2 12.8*  HCT 42.6 40.4 36.3*  MCV 110.2* 109.9* 111.1*  PLT 180 130* 105*    Cardiac Enzymes: No results for input(s): CKTOTAL, CKMB, CKMBINDEX, TROPONINI in the last 168 hours.  BNP: Invalid input(s): POCBNP  CBG: Recent Labs  Lab 09/18/17 2313 09/19/17 7062  BJSEGB 15 17    Microbiology: No results found for this or any previous visit.  Coagulation Studies: No results for input(s): LABPROT, INR in the last 72 hours.  Urinalysis: Recent Labs    09/18/17 2133  COLORURINE YELLOW*  LABSPEC 1.006  PHURINE 6.0  GLUCOSEU 150*  HGBUR MODERATE*  BILIRUBINUR NEGATIVE  KETONESUR NEGATIVE  PROTEINUR NEGATIVE  NITRITE NEGATIVE  LEUKOCYTESUR NEGATIVE      Imaging: Ct Head Wo Contrast  Result Date: 09/18/2017 CLINICAL DATA:  Fall face first at home. EXAM: CT HEAD WITHOUT CONTRAST CT MAXILLOFACIAL WITHOUT CONTRAST CT CERVICAL SPINE WITHOUT CONTRAST TECHNIQUE: Multidetector CT imaging of the head, cervical spine, and maxillofacial structures were performed using the standard protocol without intravenous contrast. Multiplanar CT image reconstructions of the cervical spine and maxillofacial structures were also generated. COMPARISON:  MR cervical spine dated October 03, 2014. FINDINGS: CT HEAD FINDINGS Brain: No evidence of acute infarction, hemorrhage, hydrocephalus, extra-axial collection or mass lesion/mass effect. Mild  generalized cerebral atrophy. Scattered mild periventricular and subcortical white matter hypodensities are nonspecific, but favored to reflect chronic microvascular ischemic changes. Vascular: Calcified atherosclerosis at the skullbase. No hyperdense vessel. Dolichoectasia of the proximal basilar artery. Skull: Negative for fracture or focal lesion. Other: None. CT MAXILLOFACIAL FINDINGS Osseous: Minimally depressed fracture of the nasal bone. Fracture of the right maxillary central incisor root. Absent left maxillary central incisor. Orbits: Negative. No  traumatic or inflammatory finding. Sinuses: Clear. Soft tissues: Nasal soft tissue swelling. CT CERVICAL SPINE FINDINGS Alignment: Unchanged degenerative 2 mm anterolisthesis at C5-C6. No traumatic malalignment. Skull base and vertebrae: No acute fracture. No primary bone lesion or focal pathologic process. Soft tissues and spinal canal: No prevertebral fluid or swelling. No visible canal hematoma. Disc levels: Severe multilevel degenerative changes throughout the cervical spine. Upper chest: Negative. Other: None. IMPRESSION: 1. Minimally depressed fracture of the nasal bone with overlying soft tissue swelling. 2. Fracture of the right maxillary central incisor root. Absent left maxillary central incisor. 3. No acute intracranial abnormality. Atrophy and chronic microvascular ischemic changes. 4. No acute cervical spine fracture. Severe multilevel degenerative changes. Electronically Signed   By: Titus Dubin M.D.   On: 09/18/2017 20:12   Ct Cervical Spine Wo Contrast  Result Date: 09/18/2017 CLINICAL DATA:  Fall face first at home. EXAM: CT HEAD WITHOUT CONTRAST CT MAXILLOFACIAL WITHOUT CONTRAST CT CERVICAL SPINE WITHOUT CONTRAST TECHNIQUE: Multidetector CT imaging of the head, cervical spine, and maxillofacial structures were performed using the standard protocol without intravenous contrast. Multiplanar CT image reconstructions of the cervical spine and maxillofacial structures were also generated. COMPARISON:  MR cervical spine dated October 03, 2014. FINDINGS: CT HEAD FINDINGS Brain: No evidence of acute infarction, hemorrhage, hydrocephalus, extra-axial collection or mass lesion/mass effect. Mild generalized cerebral atrophy. Scattered mild periventricular and subcortical white matter hypodensities are nonspecific, but favored to reflect chronic microvascular ischemic changes. Vascular: Calcified atherosclerosis at the skullbase. No hyperdense vessel. Dolichoectasia of the proximal basilar artery. Skull:  Negative for fracture or focal lesion. Other: None. CT MAXILLOFACIAL FINDINGS Osseous: Minimally depressed fracture of the nasal bone. Fracture of the right maxillary central incisor root. Absent left maxillary central incisor. Orbits: Negative. No traumatic or inflammatory finding. Sinuses: Clear. Soft tissues: Nasal soft tissue swelling. CT CERVICAL SPINE FINDINGS Alignment: Unchanged degenerative 2 mm anterolisthesis at C5-C6. No traumatic malalignment. Skull base and vertebrae: No acute fracture. No primary bone lesion or focal pathologic process. Soft tissues and spinal canal: No prevertebral fluid or swelling. No visible canal hematoma. Disc levels: Severe multilevel degenerative changes throughout the cervical spine. Upper chest: Negative. Other: None. IMPRESSION: 1. Minimally depressed fracture of the nasal bone with overlying soft tissue swelling. 2. Fracture of the right maxillary central incisor root. Absent left maxillary central incisor. 3. No acute intracranial abnormality. Atrophy and chronic microvascular ischemic changes. 4. No acute cervical spine fracture. Severe multilevel degenerative changes. Electronically Signed   By: Titus Dubin M.D.   On: 09/18/2017 20:12   US Renal  Result Date: 09/18/2017 CLINICAL DATA:  72 year old male with acute kidney injury. EXAM: RENAL / URINARY TRACT ULTRASOUND COMPLETE COMPARISON:  Renal ultrasound dated 06/23/2017 FINDINGS: Right Kidney: Length: 10 cm. Mildly atrophic and echogenic. Trace perinephric fluid, nonspecific. A 1.3 cm echogenic focus in the midpole may represent an area of calcification versus a nonobstructing stone. No hydronephrosis. Left Kidney: Length: 10 cm. Mildly atrophic and echogenic. No hydronephrosis or shadowing stone. An 8 mm interpolar cyst is noted. Bladder: Appears normal for degree  of bladder distention. Incidental note of an echogenic liver most consistent with fatty infiltration. IMPRESSION: 1. Mildly atrophic kidneys  bilaterally. There is increased renal parenchymal echogenicity, likely related to underlying medical renal disease. Clinical correlation recommended. 2. A 1.3 cm focus of calcification versus a nonobstructing stone in the interpolar right kidney. No hydronephrosis. Electronically Signed   By: Anner Crete M.D.   On: 09/18/2017 23:07   Dg Chest Portable 1 View  Result Date: 09/18/2017 CLINICAL DATA:  Fall.  Evaluate for pneumonia and pneumothorax EXAM: PORTABLE CHEST 1 VIEW COMPARISON:  None. FINDINGS: Cardiomegaly. Left base atelectasis. Right lung clear. No effusions. No pneumothorax. No acute bony abnormality. IMPRESSION: Cardiomegaly.  Left base atelectasis. Electronically Signed   By: Rolm Baptise M.D.   On: 09/18/2017 19:49   Ct Maxillofacial Wo Contrast  Result Date: 09/18/2017 CLINICAL DATA:  Fall face first at home. EXAM: CT HEAD WITHOUT CONTRAST CT MAXILLOFACIAL WITHOUT CONTRAST CT CERVICAL SPINE WITHOUT CONTRAST TECHNIQUE: Multidetector CT imaging of the head, cervical spine, and maxillofacial structures were performed using the standard protocol without intravenous contrast. Multiplanar CT image reconstructions of the cervical spine and maxillofacial structures were also generated. COMPARISON:  MR cervical spine dated October 03, 2014. FINDINGS: CT HEAD FINDINGS Brain: No evidence of acute infarction, hemorrhage, hydrocephalus, extra-axial collection or mass lesion/mass effect. Mild generalized cerebral atrophy. Scattered mild periventricular and subcortical white matter hypodensities are nonspecific, but favored to reflect chronic microvascular ischemic changes. Vascular: Calcified atherosclerosis at the skullbase. No hyperdense vessel. Dolichoectasia of the proximal basilar artery. Skull: Negative for fracture or focal lesion. Other: None. CT MAXILLOFACIAL FINDINGS Osseous: Minimally depressed fracture of the nasal bone. Fracture of the right maxillary central incisor root. Absent left  maxillary central incisor. Orbits: Negative. No traumatic or inflammatory finding. Sinuses: Clear. Soft tissues: Nasal soft tissue swelling. CT CERVICAL SPINE FINDINGS Alignment: Unchanged degenerative 2 mm anterolisthesis at C5-C6. No traumatic malalignment. Skull base and vertebrae: No acute fracture. No primary bone lesion or focal pathologic process. Soft tissues and spinal canal: No prevertebral fluid or swelling. No visible canal hematoma. Disc levels: Severe multilevel degenerative changes throughout the cervical spine. Upper chest: Negative. Other: None. IMPRESSION: 1. Minimally depressed fracture of the nasal bone with overlying soft tissue swelling. 2. Fracture of the right maxillary central incisor root. Absent left maxillary central incisor. 3. No acute intracranial abnormality. Atrophy and chronic microvascular ischemic changes. 4. No acute cervical spine fracture. Severe multilevel degenerative changes. Electronically Signed   By: Titus Dubin M.D.   On: 09/18/2017 20:12     Medications:   . sodium chloride 100 mL/hr at 09/20/17 0531   . docusate sodium  100 mg Oral BID  . folic acid  1 mg Oral Daily  . heparin  5,000 Units Subcutaneous Q8H  . LORazepam  0-4 mg Intravenous Q6H   Followed by  . LORazepam  0-4 mg Intravenous Q12H  . multivitamin with minerals  1 tablet Oral Daily  . nystatin ointment   Topical BID  . pantoprazole  40 mg Oral Daily  . thiamine  100 mg Oral Daily   Or  . thiamine  100 mg Intravenous Daily   acetaminophen **OR** acetaminophen, bisacodyl, guaiFENesin, HYDROcodone-acetaminophen, LORazepam **OR** LORazepam, menthol-cetylpyridinium, ondansetron **OR** ondansetron (ZOFRAN) IV, traZODone  Assessment/ Plan:  Mr. Marcio Hoque. is a 72 y.o. white male with Mr. Avan Gullett. is a 72 y.o. white male with HIV, chronic kidney disease stage III, who was admitted to [facility]  on 09/18/2017 for acute renal failure  1. Acute renal failure with metabolic  acidosis and hyponatremia on chronic kidney disease stage III with hematuria.  Baseline creatinine of 1.67, GFR of 41 on 05/19/17.  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  - Holding lisinopril - Continue IV fluids - Encourage PO intake  2. Hypertension: blood pressure at goal.  - holding lisinopril  3. HIV: previously following with Dr. Ola Spurr. - HAART  4. Thrombocytopenia: platelets have trended down to 105. 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 Followed by Dr. Rogue Bussing, [place].     LOS: 2 Hadi Dubin 7/20/201910:56 AM

## 2017-09-20 NOTE — Progress Notes (Signed)
Notified MD of pt with sore throat. Orders placed. Will continue to monitor and assess

## 2017-09-20 NOTE — Progress Notes (Signed)
Patient ID: Anthony Must., male   DOB: 1945-10-20, 72 y.o.   MRN: [id number]  [facility] PROGRESS NOTE  Anthony Must. OIT:[id number] DOB: 01-24-1946 DOA: 09/18/2017 PCP: Theotis Burrow, MD  HPI/Subjective: Patient states that his tooth is loose and needs to come out because it is bothering him when he eats.  States he is feels better than when he came in.  He is urinating well and eating well.  Objective: Vitals:   09/20/17 0406 09/20/17 0746  BP: 130/78 126/88  Pulse: 89 88  Resp: 18 16  Temp: 98.1 F (36.7 C) 98.3 F (36.8 C)  SpO2: 96% 99%    Filed Weights   09/18/17 2304 09/19/17 0449 09/20/17 0413  Weight: 90.8 kg (200 lb 1.6 oz) 90.6 kg (199 lb 12.8 oz) 91.4 kg (201 lb 6.4 oz)    ROS: Review of Systems  Constitutional: Negative for chills and fever.  Eyes: Negative for blurred vision.  Respiratory: Negative for cough and shortness of breath.   Cardiovascular: Negative for chest pain.  Gastrointestinal: Negative for abdominal pain, constipation, diarrhea, nausea and vomiting.  Genitourinary: Negative for dysuria.  Musculoskeletal: Negative for joint pain.  Neurological: Negative for dizziness and headaches.   Exam: Physical Exam  Constitutional: He is oriented to person, place, and time.  HENT:  Nose: No mucosal edema.  Mouth/Throat: No oropharyngeal exudate or posterior oropharyngeal edema.  Eyes: Pupils are equal, round, and reactive to light. Conjunctivae, EOM and lids are normal.  Neck: No JVD present. Carotid bruit is not present. No edema present. No thyroid mass and no thyromegaly present.  Cardiovascular: S1 normal and S2 normal. Exam reveals no gallop.  No murmur heard. Pulses:      Dorsalis pedis pulses are 2+ on the right side, and 2+ on the left side.  Respiratory: No respiratory distress. He has no wheezes. He has no rhonchi. He has no rales.  GI: Soft. Bowel sounds are normal. There is no tenderness.  Musculoskeletal:        Right ankle: He exhibits swelling.       Left ankle: He exhibits no swelling.  Lymphadenopathy:    He has no cervical adenopathy.  Neurological: He is alert and oriented to person, place, and time. No cranial nerve deficit.  Skin: Skin is warm. No rash noted. Nails show no clubbing.  Dried blood over the nose.  Psychiatric: He has a normal mood and affect.      Data Reviewed: Basic Metabolic Panel: Recent Labs  Lab 09/18/17 1925 09/19/17 0428 09/20/17 0641  NA 129* 137 134*  K 4.4 4.8 4.8  CL 99 109 108  CO2 18* 19* 22  GLUCOSE 100* 82 101*  BUN 50* 38* 32*  CREATININE 5.10* 4.24* 3.12*  CALCIUM 9.5 8.7* 8.1*   Liver Function Tests: Recent Labs  Lab 09/18/17 1925 09/19/17 0428  AST 72* 63*  ALT 76* 66*  ALKPHOS 98 90  BILITOT 0.7 1.4*  PROT 6.9 6.3*  ALBUMIN 4.1 3.6   CBC: Recent Labs  Lab 09/18/17 1925 09/19/17 0428 09/20/17 0641  WBC 11.1* 9.9 5.9  NEUTROABS 6.0  --   --   HGB 14.9 14.2 12.8*  HCT 42.6 40.4 36.3*  MCV 110.2* 109.9* 111.1*  PLT 180 130* 105*    CBG: Recent Labs  Lab 09/18/17 2313 09/19/17 0749 09/20/17 0742  GLUCAP 88 76 99     Studies: Ct Head Wo Contrast  Result Date: 09/18/2017 CLINICAL DATA:  Fall face first at home. EXAM: CT HEAD WITHOUT CONTRAST CT MAXILLOFACIAL WITHOUT CONTRAST CT CERVICAL SPINE WITHOUT CONTRAST TECHNIQUE: Multidetector CT imaging of the head, cervical spine, and maxillofacial structures were performed using the standard protocol without intravenous contrast. Multiplanar CT image reconstructions of the cervical spine and maxillofacial structures were also generated. COMPARISON:  MR cervical spine dated October 03, 2014. FINDINGS: CT HEAD FINDINGS Brain: No evidence of acute infarction, hemorrhage, hydrocephalus, extra-axial collection or mass lesion/mass effect. Mild generalized cerebral atrophy. Scattered mild periventricular and subcortical white matter hypodensities are nonspecific, but favored to reflect  chronic microvascular ischemic changes. Vascular: Calcified atherosclerosis at the skullbase. No hyperdense vessel. Dolichoectasia of the proximal basilar artery. Skull: Negative for fracture or focal lesion. Other: None. CT MAXILLOFACIAL FINDINGS Osseous: Minimally depressed fracture of the nasal bone. Fracture of the right maxillary central incisor root. Absent left maxillary central incisor. Orbits: Negative. No traumatic or inflammatory finding. Sinuses: Clear. Soft tissues: Nasal soft tissue swelling. CT CERVICAL SPINE FINDINGS Alignment: Unchanged degenerative 2 mm anterolisthesis at C5-C6. No traumatic malalignment. Skull base and vertebrae: No acute fracture. No primary bone lesion or focal pathologic process. Soft tissues and spinal canal: No prevertebral fluid or swelling. No visible canal hematoma. Disc levels: Severe multilevel degenerative changes throughout the cervical spine. Upper chest: Negative. Other: None. IMPRESSION: 1. Minimally depressed fracture of the nasal bone with overlying soft tissue swelling. 2. Fracture of the right maxillary central incisor root. Absent left maxillary central incisor. 3. No acute intracranial abnormality. Atrophy and chronic microvascular ischemic changes. 4. No acute cervical spine fracture. Severe multilevel degenerative changes. Electronically Signed   By: Titus Dubin M.D.   On: 09/18/2017 20:12   Ct Cervical Spine Wo Contrast  Result Date: 09/18/2017 CLINICAL DATA:  Fall face first at home. EXAM: CT HEAD WITHOUT CONTRAST CT MAXILLOFACIAL WITHOUT CONTRAST CT CERVICAL SPINE WITHOUT CONTRAST TECHNIQUE: Multidetector CT imaging of the head, cervical spine, and maxillofacial structures were performed using the standard protocol without intravenous contrast. Multiplanar CT image reconstructions of the cervical spine and maxillofacial structures were also generated. COMPARISON:  MR cervical spine dated October 03, 2014. FINDINGS: CT HEAD FINDINGS Brain: No  evidence of acute infarction, hemorrhage, hydrocephalus, extra-axial collection or mass lesion/mass effect. Mild generalized cerebral atrophy. Scattered mild periventricular and subcortical white matter hypodensities are nonspecific, but favored to reflect chronic microvascular ischemic changes. Vascular: Calcified atherosclerosis at the skullbase. No hyperdense vessel. Dolichoectasia of the proximal basilar artery. Skull: Negative for fracture or focal lesion. Other: None. CT MAXILLOFACIAL FINDINGS Osseous: Minimally depressed fracture of the nasal bone. Fracture of the right maxillary central incisor root. Absent left maxillary central incisor. Orbits: Negative. No traumatic or inflammatory finding. Sinuses: Clear. Soft tissues: Nasal soft tissue swelling. CT CERVICAL SPINE FINDINGS Alignment: Unchanged degenerative 2 mm anterolisthesis at C5-C6. No traumatic malalignment. Skull base and vertebrae: No acute fracture. No primary bone lesion or focal pathologic process. Soft tissues and spinal canal: No prevertebral fluid or swelling. No visible canal hematoma. Disc levels: Severe multilevel degenerative changes throughout the cervical spine. Upper chest: Negative. Other: None. IMPRESSION: 1. Minimally depressed fracture of the nasal bone with overlying soft tissue swelling. 2. Fracture of the right maxillary central incisor root. Absent left maxillary central incisor. 3. No acute intracranial abnormality. Atrophy and chronic microvascular ischemic changes. 4. No acute cervical spine fracture. Severe multilevel degenerative changes. Electronically Signed   By: Titus Dubin M.D.   On: 09/18/2017 20:12   US Renal  Result Date: 09/18/2017  CLINICAL DATA:  72 year old male with acute kidney injury. EXAM: RENAL / URINARY TRACT ULTRASOUND COMPLETE COMPARISON:  Renal ultrasound dated 06/23/2017 FINDINGS: Right Kidney: Length: 10 cm. Mildly atrophic and echogenic. Trace perinephric fluid, nonspecific. A 1.3 cm  echogenic focus in the midpole may represent an area of calcification versus a nonobstructing stone. No hydronephrosis. Left Kidney: Length: 10 cm. Mildly atrophic and echogenic. No hydronephrosis or shadowing stone. An 8 mm interpolar cyst is noted. Bladder: Appears normal for degree of bladder distention. Incidental note of an echogenic liver most consistent with fatty infiltration. IMPRESSION: 1. Mildly atrophic kidneys bilaterally. There is increased renal parenchymal echogenicity, likely related to underlying medical renal disease. Clinical correlation recommended. 2. A 1.3 cm focus of calcification versus a nonobstructing stone in the interpolar right kidney. No hydronephrosis. Electronically Signed   By: Anner Crete M.D.   On: 09/18/2017 23:07   Dg Chest Portable 1 View  Result Date: 09/18/2017 CLINICAL DATA:  Fall.  Evaluate for pneumonia and pneumothorax EXAM: PORTABLE CHEST 1 VIEW COMPARISON:  None. FINDINGS: Cardiomegaly. Left base atelectasis. Right lung clear. No effusions. No pneumothorax. No acute bony abnormality. IMPRESSION: Cardiomegaly.  Left base atelectasis. Electronically Signed   By: Rolm Baptise M.D.   On: 09/18/2017 19:49   Ct Maxillofacial Wo Contrast  Result Date: 09/18/2017 CLINICAL DATA:  Fall face first at home. EXAM: CT HEAD WITHOUT CONTRAST CT MAXILLOFACIAL WITHOUT CONTRAST CT CERVICAL SPINE WITHOUT CONTRAST TECHNIQUE: Multidetector CT imaging of the head, cervical spine, and maxillofacial structures were performed using the standard protocol without intravenous contrast. Multiplanar CT image reconstructions of the cervical spine and maxillofacial structures were also generated. COMPARISON:  MR cervical spine dated October 03, 2014. FINDINGS: CT HEAD FINDINGS Brain: No evidence of acute infarction, hemorrhage, hydrocephalus, extra-axial collection or mass lesion/mass effect. Mild generalized cerebral atrophy. Scattered mild periventricular and subcortical white matter  hypodensities are nonspecific, but favored to reflect chronic microvascular ischemic changes. Vascular: Calcified atherosclerosis at the skullbase. No hyperdense vessel. Dolichoectasia of the proximal basilar artery. Skull: Negative for fracture or focal lesion. Other: None. CT MAXILLOFACIAL FINDINGS Osseous: Minimally depressed fracture of the nasal bone. Fracture of the right maxillary central incisor root. Absent left maxillary central incisor. Orbits: Negative. No traumatic or inflammatory finding. Sinuses: Clear. Soft tissues: Nasal soft tissue swelling. CT CERVICAL SPINE FINDINGS Alignment: Unchanged degenerative 2 mm anterolisthesis at C5-C6. No traumatic malalignment. Skull base and vertebrae: No acute fracture. No primary bone lesion or focal pathologic process. Soft tissues and spinal canal: No prevertebral fluid or swelling. No visible canal hematoma. Disc levels: Severe multilevel degenerative changes throughout the cervical spine. Upper chest: Negative. Other: None. IMPRESSION: 1. Minimally depressed fracture of the nasal bone with overlying soft tissue swelling. 2. Fracture of the right maxillary central incisor root. Absent left maxillary central incisor. 3. No acute intracranial abnormality. Atrophy and chronic microvascular ischemic changes. 4. No acute cervical spine fracture. Severe multilevel degenerative changes. Electronically Signed   By: Titus Dubin M.D.   On: 09/18/2017 20:12    Scheduled Meds: . docusate sodium  100 mg Oral BID  . folic acid  1 mg Oral Daily  . LORazepam  0-4 mg Intravenous Q6H   Followed by  . LORazepam  0-4 mg Intravenous Q12H  . multivitamin with minerals  1 tablet Oral Daily  . nystatin ointment   Topical BID  . pantoprazole  40 mg Oral Daily  . thiamine  100 mg Oral Daily   Or  . thiamine  100 mg Intravenous Daily   Continuous Infusions: . sodium chloride 100 mL/hr at 09/20/17 0531    Assessment/Plan:  1. Acute kidney injury on chronic kidney  disease stage III.  Worsened by taking Bactrim and drinking alcohol.  IV fluid hydration.  Creatinine improved from 5.1 down to 3.12.  Hopefully tomorrow his creatinine will be in the twos and patient will be able to go home. 2. Syncope likely related to alcohol intoxication and acute kidney injury.  Check orthostatic vital signs. 3. Alcohol abuse.  Patient was advised not to drink any further alcohol. 4. Nasal bone fracture status post fall 5. Loose teeth after fall.  Patient will need to follow-up with the dentist to take out the root of one tooth that broke off and the loose tooth. 6. HIV positive.  Hopefully can restart Biktarvy on going home. 7. Fungal rash in groin area on nystatin 8. Elevated liver function test secondary to alcohol abuse 9. Drop in platelets probably secondary to alcohol abuse but I will stop heparin subcutaneous injections at this point.  Code Status:     Code Status Orders  (From admission, onward)        Start     Ordered   09/18/17 2255  Full code  Continuous     09/18/17 2256    Code Status History    This patient has a current code status but no historical code status.      Disposition Plan: Hopefully home tomorrow  Consultants: -Nephrology  Time spent: 28 minutes  [place]

## 2017-09-21 LAB — BASIC METABOLIC PANEL
Anion gap: 6 (ref 5–15)
BUN: 25 mg/dL — ABNORMAL HIGH (ref 8–23)
CO2: 19 mmol/L — ABNORMAL LOW (ref 22–32)
Calcium: 8.3 mg/dL — ABNORMAL LOW (ref 8.9–10.3)
Chloride: 110 mmol/L (ref 98–111)
Creatinine, Ser: 2.43 mg/dL — ABNORMAL HIGH (ref 0.61–1.24)
GFR calc Af Amer: 30 mL/min — ABNORMAL LOW (ref >60)
GFR calc non Af Amer: 26 mL/min — ABNORMAL LOW (ref >60)
Glucose, Bld: 104 mg/dL — ABNORMAL HIGH (ref 70–99)
Potassium: 4.6 mmol/L (ref 3.5–5.1)
Sodium: 135 mmol/L (ref 135–145)

## 2017-09-21 LAB — PLATELET COUNT: Platelets: 104 10*3/uL — ABNORMAL LOW (ref 150–440)

## 2017-09-21 LAB — GLUCOSE, CAPILLARY: Glucose-Capillary: 113 mg/dL — ABNORMAL HIGH (ref 70–99)

## 2017-09-21 MED ORDER — LORAZEPAM 2 MG/ML IJ SOLN
1.0000 mg | INTRAMUSCULAR | Status: DC | PRN
Start: 2017-09-21 — End: 2017-09-23
  Administered 2017-09-21 – 2017-09-22 (×3): 1 mg via INTRAVENOUS
  Filled 2017-09-21: qty 1

## 2017-09-21 MED ORDER — LORAZEPAM 1 MG PO TABS
1.0000 mg | ORAL_TABLET | ORAL | Status: DC | PRN
Start: 2017-09-21 — End: 2017-09-23
  Administered 2017-09-22: 1 mg via ORAL
  Filled 2017-09-21 (×2): qty 1

## 2017-09-21 MED ORDER — TRAZODONE HCL 50 MG PO TABS
50.0000 mg | ORAL_TABLET | Freq: Every day | ORAL | Status: DC
  Administered 2017-09-22: 50 mg via ORAL
  Filled 2017-09-21 (×2): qty 1

## 2017-09-21 NOTE — Progress Notes (Signed)
Patient ID: Anthony Must., male   DOB: 11-26-1945, 72 y.o.   MRN: [id number]  [facility] PROGRESS NOTE  Anthony Must. CHY:[id number] DOB: 21-Jan-1946 DOA: 09/18/2017 PCP: Theotis Burrow, MD  HPI/Subjective: Patient with some tremulousness this morning.  He stated he did not sleep last night.  He is wondering about the legitimacy of this Hospital.  He is asking for something to eat.  The patient unhooked his IV this morning.  Objective: Vitals:   09/21/17 0402 09/21/17 0754  BP: 133/78 (!) 155/89  Pulse: 95 (!) 101  Resp:  18  Temp:  (!) 97.5 F (36.4 C)  SpO2: 96% 97%    Filed Weights   09/19/17 0449 09/20/17 0413 09/21/17 0355  Weight: 90.6 kg (199 lb 12.8 oz) 91.4 kg (201 lb 6.4 oz) 90.5 kg (199 lb 9.6 oz)    ROS: Review of Systems  Constitutional: Negative for chills and fever.  Eyes: Negative for blurred vision.  Respiratory: Positive for cough. Negative for shortness of breath.   Cardiovascular: Negative for chest pain.  Gastrointestinal: Negative for abdominal pain, constipation, diarrhea, nausea and vomiting.  Genitourinary: Negative for dysuria.  Musculoskeletal: Negative for joint pain.  Neurological: Positive for tremors. Negative for dizziness and headaches.  Psychiatric/Behavioral: The patient has insomnia.    Exam: Physical Exam  HENT:  Nose: No mucosal edema.  Mouth/Throat: No oropharyngeal exudate or posterior oropharyngeal edema.  Eyes: Pupils are equal, round, and reactive to light. Conjunctivae, EOM and lids are normal.  Neck: No JVD present. Carotid bruit is not present. No edema present. No thyroid mass and no thyromegaly present.  Cardiovascular: S1 normal and S2 normal. Exam reveals no gallop.  No murmur heard. Pulses:      Dorsalis pedis pulses are 2+ on the right side, and 2+ on the left side.  Respiratory: No respiratory distress. He has decreased breath sounds in the right lower field and the left lower field. He has no  wheezes. He has no rhonchi. He has no rales.  GI: Soft. Bowel sounds are normal. There is no tenderness.  Musculoskeletal:       Right ankle: He exhibits no swelling.       Left ankle: He exhibits no swelling.  Lymphadenopathy:    He has no cervical adenopathy.  Neurological: He is alert. He displays tremor. No cranial nerve deficit.  Some confusion today with some of his answers  Skin: Skin is warm. No rash noted. Nails show no clubbing.  Dried blood over the nose.  Psychiatric: He has a normal mood and affect.      Data Reviewed: Basic Metabolic Panel: Recent Labs  Lab 09/18/17 1925 09/19/17 0428 09/20/17 0641 09/21/17 0410  NA 129* 137 134* 135  K 4.4 4.8 4.8 4.6  CL 99 109 108 110  CO2 18* 19* 22 19*  GLUCOSE 100* 82 101* 104*  BUN 50* 38* 32* 25*  CREATININE 5.10* 4.24* 3.12* 2.43*  CALCIUM 9.5 8.7* 8.1* 8.3*   Liver Function Tests: Recent Labs  Lab 09/18/17 1925 09/19/17 0428  AST 72* 63*  ALT 76* 66*  ALKPHOS 98 90  BILITOT 0.7 1.4*  PROT 6.9 6.3*  ALBUMIN 4.1 3.6   CBC: Recent Labs  Lab 09/18/17 1925 09/19/17 0428 09/20/17 0641 09/21/17 0410  WBC 11.1* 9.9 5.9  --   NEUTROABS 6.0  --   --   --   HGB 14.9 14.2 12.8*  --   HCT 42.6 40.4 36.3*  --  MCV 110.2* 109.9* 111.1*  --   PLT 180 130* 105* 104*    CBG: Recent Labs  Lab 09/18/17 2313 09/19/17 0749 09/20/17 0742 09/21/17 0754  GLUCAP 88 76 99 113*      Scheduled Meds: . docusate sodium  100 mg Oral BID  . folic acid  1 mg Oral Daily  . LORazepam  0-4 mg Intravenous Q12H  . multivitamin with minerals  1 tablet Oral Daily  . nystatin ointment   Topical BID  . pantoprazole  40 mg Oral Daily  . thiamine  100 mg Oral Daily   Or  . thiamine  100 mg Intravenous Daily  . traZODone  50 mg Oral QHS   Continuous Infusions: . sodium chloride 100 mL/hr at 09/21/17 0835    Assessment/Plan:  1. Alcohol withdrawal.  Placed on alcohol withdrawal protocol.  Continue to monitor here in  the hospital.   2. Acute kidney injury on chronic kidney disease stage III.  Worsened by taking Bactrim and drinking alcohol.  IV fluid hydration.  Creatinine improved from 5.1 down to 2.43.  3. Syncope likely related to alcohol intoxication and acute kidney injury.  Patient also orthostatic yesterday. 4. Alcohol abuse.  Patient was advised not to drink any further alcohol. 5. Nasal bone fracture status post fall 6. Loose teeth after fall.  Patient will need to follow-up with the dentist to take out the root of one tooth that broke off and the loose tooth. 7. HIV positive.  Hopefully can restart Biktarvy on going home. 8. Fungal rash in groin area on nystatin 9. Elevated liver function test secondary to alcohol abuse 10. Drop in platelets probably secondary to alcohol abuse but I will stop heparin subcutaneous injections at this point. 11. Insomnia.  Put on trazodone at night.  Code Status:     Code Status Orders  (From admission, onward)        Start     Ordered   09/18/17 2255  Full code  Continuous     09/18/17 2256    Code Status History    This patient has a current code status but no historical code status.      Disposition Plan: To be determined depending on his alcohol withdrawal.  Consultants: -Nephrology  Time spent: 29 minutes  [place]

## 2017-09-21 NOTE — Plan of Care (Signed)
Needs reinforcing

## 2017-09-21 NOTE — Progress Notes (Signed)
Safety sitter initiated at beginning of shift, as pt is + on CIWA scale, pulling at lines, and continuously jumping out of bed. He is not oriented to place or time. He has required Ativan with each CIWA scale, as well as PRN Ativan in between. He continues to get out of bed and try to leave the room. He has to be redirected by the sitter multiple times an hour. Sitter and RN will continue to monitor and promote patient safety.

## 2017-09-21 NOTE — Progress Notes (Signed)
[facility]  ROUNDING NOTE   Subjective:   Agitated and confused this morning. IVF held due to agitation.   Creatinine 2.43 (3.12)  UOP 2174mL  Objective:  Vital signs in last 24 hours:  Temp:  [97.5 F (36.4 C)-98.3 F (36.8 C)] 97.5 F (36.4 C) (07/21 0754) Pulse Rate:  [95-101] 101 (07/21 0754) Resp:  [12-18] 18 (07/21 0754) BP: (104-155)/(74-89) 155/89 (07/21 0754) SpO2:  [93 %-97 %] 97 % (07/21 0754) Weight:  [90.5 kg (199 lb 9.6 oz)] 90.5 kg (199 lb 9.6 oz) (07/21 0355)  Weight change: -0.816 kg (-1 lb 12.8 oz) Filed Weights   09/19/17 0449 09/20/17 0413 09/21/17 0355  Weight: 90.6 kg (199 lb 12.8 oz) 91.4 kg (201 lb 6.4 oz) 90.5 kg (199 lb 9.6 oz)    Intake/Output: I/O last 3 completed shifts: In: 1520 [P.O.:720; I.V.:800] Out: 2800 [Urine:2800]   Intake/Output this shift:  No intake/output data recorded.  Physical Exam: General: NAD,   Head: +fractured nose, +laceration, loose tooth  Eyes: Anicteric, PERRL  Neck: Supple, trachea midline  Lungs:  Clear to auscultation  Heart: Regular rate and rhythm  Abdomen:  Soft, nontender  Extremities:  no peripheral edema.  Neurologic: Nonfocal, moving all four extremities  Skin: Nasal laceration        Basic Metabolic Panel: Recent Labs  Lab 09/18/17 1925 09/19/17 0428 09/20/17 0641 09/21/17 0410  NA 129* 137 134* 135  K 4.4 4.8 4.8 4.6  CL 99 109 108 110  CO2 18* 19* 22 19*  GLUCOSE 100* 82 101* 104*  BUN 50* 38* 32* 25*  CREATININE 5.10* 4.24* 3.12* 2.43*  CALCIUM 9.5 8.7* 8.1* 8.3*    Liver Function Tests: Recent Labs  Lab 09/18/17 1925 09/19/17 0428  AST 72* 63*  ALT 76* 66*  ALKPHOS 98 90  BILITOT 0.7 1.4*  PROT 6.9 6.3*  ALBUMIN 4.1 3.6   No results for input(s): LIPASE, AMYLASE in the last 168 hours. No results for input(s): AMMONIA in the last 168 hours.  CBC: Recent Labs  Lab 09/18/17 1925 09/19/17 0428 09/20/17 0641 09/21/17 0410  WBC 11.1* 9.9 5.9  --    NEUTROABS 6.0  --   --   --   HGB 14.9 14.2 12.8*  --   HCT 42.6 40.4 36.3*  --   MCV 110.2* 109.9* 111.1*  --   PLT 180 130* 105* 104*    Cardiac Enzymes: No results for input(s): CKTOTAL, CKMB, CKMBINDEX, TROPONINI in the last 168 hours.  BNP: Invalid input(s): POCBNP  CBG: Recent Labs  Lab 09/18/17 2313 09/19/17 0749 09/20/17 0742 09/21/17 0754  GLUCAP 88 76 99 113*    Microbiology: No results found for this or any previous visit.  Coagulation Studies: No results for input(s): LABPROT, INR in the last 72 hours.  Urinalysis: Recent Labs    09/18/17 2133  COLORURINE YELLOW*  LABSPEC 1.006  PHURINE 6.0  GLUCOSEU 150*  HGBUR MODERATE*  BILIRUBINUR NEGATIVE  KETONESUR NEGATIVE  PROTEINUR NEGATIVE  NITRITE NEGATIVE  LEUKOCYTESUR NEGATIVE      Imaging: No results found.   Medications:   . sodium chloride 100 mL/hr at 09/21/17 0835   . docusate sodium  100 mg Oral BID  . folic acid  1 mg Oral Daily  . LORazepam  0-4 mg Intravenous Q12H  . multivitamin with minerals  1 tablet Oral Daily  . nystatin ointment   Topical BID  . pantoprazole  40 mg Oral Daily  .  thiamine  100 mg Oral Daily   Or  . thiamine  100 mg Intravenous Daily   acetaminophen **OR** acetaminophen, bisacodyl, guaiFENesin, HYDROcodone-acetaminophen, LORazepam **OR** LORazepam, menthol-cetylpyridinium, ondansetron **OR** ondansetron (ZOFRAN) IV, traZODone  Assessment/ Plan:  Mr. Lalo Tromp. is a 72 y.o. white male with Mr. Marlos Carmen. is a 72 y.o. white male with HIV, chronic kidney disease stage III, who was admitted to [facility] on 09/18/2017 for acute renal failure  1. Acute renal failure with metabolic acidosis and hyponatremia on chronic kidney disease stage III with hematuria.  Baseline creatinine of 1.67, GFR of 41 on 05/19/17.  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 Followed by [facility], Dr. Radene Knee Acute renal failure secondary to  Bactrim and prerenal azotemia Sodium and carbon dioxide have improved.  - Holding lisinopril - Continue IV fluids: NS at 168mL/hr - Encourage PO intake  2. Hypertension: blood pressure at goal.  - holding lisinopril  3. HIV: previously following with Dr. Ola Spurr. - HAART  4. Thrombocytopenia: platelets have trended down to 104. 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 Followed by Dr. Rogue Bussing, [place] as outpatient.     LOS: 3 Jerimiah Wolman 7/21/20198:50 AM

## 2017-09-21 NOTE — Progress Notes (Signed)
  First part of shift, pt A&O, cooperative. After 1am, pt becoming anxious, not wanting to stay in bed, wanting to go smoke, needs to go to gym. Attempts with redirecting, easily redirected at the time.

## 2017-09-22 ENCOUNTER — Inpatient Hospital Stay: Payer: Medicare Other

## 2017-09-22 LAB — HEPATITIS PANEL, ACUTE
HCV Ab: 1.3 s/co ratio — ABNORMAL HIGH (ref 0.0–0.9)
Hep A IgM: NEGATIVE
Hep B C IgM: NEGATIVE
Hepatitis B Surface Ag: NEGATIVE

## 2017-09-22 LAB — GLUCOSE, CAPILLARY: Glucose-Capillary: 117 mg/dL — ABNORMAL HIGH (ref 70–99)

## 2017-09-22 LAB — BASIC METABOLIC PANEL
Anion gap: 7 (ref 5–15)
BUN: 22 mg/dL (ref 8–23)
CO2: 20 mmol/L — ABNORMAL LOW (ref 22–32)
Calcium: 8.5 mg/dL — ABNORMAL LOW (ref 8.9–10.3)
Chloride: 107 mmol/L (ref 98–111)
Creatinine, Ser: 1.95 mg/dL — ABNORMAL HIGH (ref 0.61–1.24)
GFR calc Af Amer: 39 mL/min — ABNORMAL LOW (ref >60)
GFR calc non Af Amer: 33 mL/min — ABNORMAL LOW (ref >60)
Glucose, Bld: 95 mg/dL (ref 70–99)
Potassium: 4.4 mmol/L (ref 3.5–5.1)
Sodium: 134 mmol/L — ABNORMAL LOW (ref 135–145)

## 2017-09-22 MED ORDER — NYSTATIN 100000 UNIT/GM EX POWD
Freq: Two times a day (BID) | CUTANEOUS | Status: DC
  Administered 2017-09-23: 10:00:00 via TOPICAL
  Filled 2017-09-22: qty 15

## 2017-09-22 MED ORDER — POLYVINYL ALCOHOL 1.4 % OP SOLN
1.0000 [drp] | Freq: Four times a day (QID) | OPHTHALMIC | Status: DC | PRN
  Filled 2017-09-22: qty 15

## 2017-09-22 MED ORDER — TRAZODONE HCL 50 MG PO TABS: 50.0000 mg | ORAL_TABLET | Freq: Every evening | ORAL | 0 refills | Status: DC | PRN

## 2017-09-22 MED ORDER — THIAMINE HCL 100 MG PO TABS: 100.0000 mg | ORAL_TABLET | Freq: Every day | ORAL | 0 refills | Status: DC

## 2017-09-22 MED ORDER — BICTEGRAVIR-EMTRICITAB-TENOFOV 50-200-25 MG PO TABS
1.0000 | ORAL_TABLET | Freq: Every day | ORAL | Status: DC
  Administered 2017-09-22 – 2017-09-23 (×2): 1 via ORAL
  Filled 2017-09-22 (×3): qty 1

## 2017-09-22 MED ORDER — LORAZEPAM 2 MG/ML IJ SOLN: 0.0000 mg | Freq: Two times a day (BID) | INTRAMUSCULAR | Status: DC

## 2017-09-22 MED ORDER — ADULT MULTIVITAMIN W/MINERALS CH: 1.0000 | ORAL_TABLET | Freq: Every day | ORAL | 0 refills | Status: DC

## 2017-09-22 MED ORDER — NYSTATIN 100000 UNIT/GM EX POWD: Freq: Two times a day (BID) | CUTANEOUS | 0 refills | Status: DC

## 2017-09-22 MED ORDER — ACETAMINOPHEN 325 MG PO TABS: 650.0000 mg | ORAL_TABLET | Freq: Four times a day (QID) | ORAL | Status: DC | PRN

## 2017-09-22 MED ORDER — IPRATROPIUM-ALBUTEROL 0.5-2.5 (3) MG/3ML IN SOLN
3.0000 mL | Freq: Four times a day (QID) | RESPIRATORY_TRACT | Status: DC
  Administered 2017-09-22 – 2017-09-23 (×4): 3 mL via RESPIRATORY_TRACT
  Filled 2017-09-22 (×4): qty 3

## 2017-09-22 MED ORDER — FOLIC ACID 1 MG PO TABS: 1.0000 mg | ORAL_TABLET | Freq: Every day | ORAL | 0 refills | Status: DC

## 2017-09-22 NOTE — Progress Notes (Signed)
Notified MD of pt with complaint of eyes itching and burning. Orders placed. Will continue to monitor and assess.

## 2017-09-22 NOTE — Progress Notes (Signed)
Patient ID: Anthony Must., male   DOB: [birth year], 72 y.o.   MRN: [id number]   [facility] PROGRESS NOTE  Anthony Must. RCV:[id number] DOB: May 08, 1945 DOA: 09/18/2017 PCP: Theotis Burrow, MD  HPI/Subjective: Patient feeling a little bit better this morning.  Still having a little bit of a cough.  Had a sitter placed last night.  Stated he slept well last night.  Nurse called me back later this afternoon that he was more confused and requiring some Ativan.  Objective: Vitals:   09/22/17 0915 09/22/17 1122  BP: 119/76   Pulse: 92 100  Resp: (!) 21 16  Temp: (!) 97.5 F (36.4 C)   SpO2: 97%     Filed Weights   09/20/17 0413 09/21/17 0355 09/22/17 0412  Weight: 91.4 kg (201 lb 6.4 oz) 90.5 kg (199 lb 9.6 oz) 90.2 kg (198 lb 13.7 oz)    ROS: Review of Systems  Constitutional: Negative for chills and fever.  Eyes: Negative for blurred vision.  Respiratory: Positive for cough. Negative for shortness of breath.   Cardiovascular: Negative for chest pain.  Gastrointestinal: Negative for abdominal pain, constipation, diarrhea, nausea and vomiting.  Genitourinary: Negative for dysuria.  Musculoskeletal: Negative for joint pain.  Neurological: Positive for tremors. Negative for dizziness and headaches.  Psychiatric/Behavioral: The patient does not have insomnia.    Exam: Physical Exam  HENT:  Nose: No mucosal edema.  Mouth/Throat: No oropharyngeal exudate or posterior oropharyngeal edema.  Eyes: Pupils are equal, round, and reactive to light. Conjunctivae, EOM and lids are normal.  Neck: No JVD present. Carotid bruit is not present. No edema present. No thyroid mass and no thyromegaly present.  Cardiovascular: S1 normal and S2 normal. Exam reveals no gallop.  No murmur heard. Pulses:      Dorsalis pedis pulses are 2+ on the right side, and 2+ on the left side.  Respiratory: No respiratory distress. He has decreased breath sounds in the right lower field and the  left lower field. He has no wheezes. He has no rhonchi. He has no rales.  GI: Soft. Bowel sounds are normal. There is no tenderness.  Musculoskeletal:       Right ankle: He exhibits no swelling.       Left ankle: He exhibits no swelling.  Lymphadenopathy:    He has no cervical adenopathy.  Neurological: He is alert. He displays tremor. No cranial nerve deficit.  Skin: Skin is warm. No rash noted. Nails show no clubbing.  Dried blood over the nose.  Psychiatric: He has a normal mood and affect.      Data Reviewed: Basic Metabolic Panel: Recent Labs  Lab 09/18/17 1925 09/19/17 0428 09/20/17 0641 09/21/17 0410 09/22/17 0351  NA 129* 137 134* 135 134*  K 4.4 4.8 4.8 4.6 4.4  CL 99 109 108 110 107  CO2 18* 19* 22 19* 20*  GLUCOSE 100* 82 101* 104* 95  BUN 50* 38* 32* 25* 22  CREATININE 5.10* 4.24* 3.12* 2.43* 1.95*  CALCIUM 9.5 8.7* 8.1* 8.3* 8.5*   Liver Function Tests: Recent Labs  Lab 09/18/17 1925 09/19/17 0428  AST 72* 63*  ALT 76* 66*  ALKPHOS 98 90  BILITOT 0.7 1.4*  PROT 6.9 6.3*  ALBUMIN 4.1 3.6   CBC: Recent Labs  Lab 09/18/17 1925 09/19/17 0428 09/20/17 0641 09/21/17 0410  WBC 11.1* 9.9 5.9  --   NEUTROABS 6.0  --   --   --   HGB 14.9  14.2 12.8*  --   HCT 42.6 40.4 36.3*  --   MCV 110.2* 109.9* 111.1*  --   PLT 180 130* 105* 104*    CBG: Recent Labs  Lab 09/18/17 2313 09/19/17 0749 09/20/17 0742 09/21/17 0754 09/22/17 0925  GLUCAP 88 76 99 113* 117*      Scheduled Meds: . bictegravir-emtricitabine-tenofovir AF  1 tablet Oral Daily  . docusate sodium  100 mg Oral BID  . folic acid  1 mg Oral Daily  . ipratropium-albuterol  3 mL Nebulization Q6H  . LORazepam  0-4 mg Intravenous Q12H  . LORazepam  0-4 mg Intravenous Q12H  . multivitamin with minerals  1 tablet Oral Daily  . nystatin   Topical BID  . nystatin ointment   Topical BID  . pantoprazole  40 mg Oral Daily  . thiamine  100 mg Oral Daily   Or  . thiamine  100 mg  Intravenous Daily  . traZODone  50 mg Oral QHS   Continuous Infusions:   Assessment/Plan:  1. Alcohol withdrawal.  Placed on alcohol withdrawal protocol.  Since the patient had more confusion this afternoon I will watch him another day in the hospital and reassess things tomorrow. 2. Acute kidney injury on chronic kidney disease stage III.  Worsened by taking Bactrim and drinking alcohol.  IV fluid hydration.  Creatinine improved from 5.1 down to 1.95 3. Syncope likely related to alcohol intoxication and acute kidney injury.  Patient also orthostatic yesterday. 4. Alcohol abuse.  Patient was advised not to drink any further alcohol. 5. Nasal bone fracture status post fall 6. Loose teeth after fall.  Patient will need to follow-up with the dentist to take out the root of one tooth that broke off and the loose tooth. 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 Fungal rash in groin area on nystatin 9. Elevated liver function test secondary to alcohol abuse 10. Drop in platelets probably secondary to alcohol abuse but I will stop heparin subcutaneous injections at this point. 11. Insomnia.  Put on trazodone at night.  Code Status:     Code Status Orders  (From admission, onward)        Start     Ordered   09/18/17 2255  Full code  Continuous     09/18/17 2256    Code Status History    This patient has a current code status but no historical code status.      Disposition Plan: Reevaluate daily  Consultants: -Nephrology  Time spent: 32 minutes  [place]

## 2017-09-22 NOTE — Plan of Care (Signed)
  Problem: Safety: Goal: Ability to remain free from injury will improve Outcome: Progressing   Problem: Education: Goal: Knowledge of General Education information will improve Description Including pain rating scale, medication(s)/side effects and non-pharmacologic comfort measures Outcome: Progressing   Problem: Pain Managment: Goal: General experience of comfort will improve Outcome: Progressing

## 2017-09-22 NOTE — Care Management Note (Addendum)
Case Management Note  Patient Details  Name: Anthony Olson. MRN: [id number] Date of Birth: 1946-02-23  Subjective/Objective:   Orders in place for Home health. Given choice, patient would like to use [facility]. Referral placed with Brad from [facility] who is willing to accept the patient for PT and nursing services. Patient has walker in the home and has no other DME needs. Family to provide transport. Discharge likely later today or tomorrow.  Merrily Pew Sherman Donaldson RN BSN RNCM [phone]               Update: Discharge order cancelled. Patient to leave tomorrow. [facility], notified Brad of discharge planning being switched to tomorrow.     Action/Plan:   Expected Discharge Date:  09/22/17               Expected Discharge Plan:     In-House Referral:     Discharge planning Services  CM Consult  Post Acute Care Choice:  Home Health Choice offered to:  Patient  DME Arranged:    DME Agency:     HH Arranged:  RN, PT [place] Agency:  [facility]  Status of Service:  Completed, signed off  If discussed at [place] of Stay Meetings, dates discussed:    Additional Comments:  Latanya Maudlin, RN 09/22/2017, 1:54 PM

## 2017-09-22 NOTE — Progress Notes (Signed)
[facility]  ROUNDING NOTE   Subjective:   Patient is doing fair today.  Denies any acute complaints.  Serum creatinine is down to 1.95 Urine output 2650 cc States he is able to eat some without nausea or vomiting Reports cough   Objective:  Vital signs in last 24 hours:  Temp:  [97.5 F (36.4 C)-98.2 F (36.8 C)] 98.2 F (36.8 C) (07/22 1552) Pulse Rate:  [89-101] 101 (07/22 1552) Resp:  [16-21] 18 (07/22 1552) BP: (119-130)/(76-93) 128/91 (07/22 1552) SpO2:  [96 %-97 %] 96 % (07/22 1552) Weight:  [90.2 kg (198 lb 13.7 oz)] 90.2 kg (198 lb 13.7 oz) (07/22 0412)  Weight change: -0.338 kg (-11.9 oz) Filed Weights   09/20/17 0413 09/21/17 0355 09/22/17 0412  Weight: 91.4 kg (201 lb 6.4 oz) 90.5 kg (199 lb 9.6 oz) 90.2 kg (198 lb 13.7 oz)    Intake/Output: I/O last 3 completed shifts: In: 2783.3 [P.O.:720; I.V.:2063.3] Out: 4000 [Urine:4000]   Intake/Output this shift:  Total I/O In: 600 [P.O.:600] Out: 675 [Urine:675]  Physical Exam: General: NAD,   Head: +fractured nose, +laceration, loose tooth  Eyes: Anicteric,   Neck: Supple,   Lungs:   Mild diffuse rhonchi  Heart: Regular rate and rhythm  Abdomen:  Soft, nontender  Extremities:  Trace peripheral edema.  Neurologic: Nonfocal, moving all four extremities, alert, able to answer questions  Skin: Nasal laceration        Basic Metabolic Panel: Recent Labs  Lab 09/18/17 1925 09/19/17 0428 09/20/17 0641 09/21/17 0410 09/22/17 0351  NA 129* 137 134* 135 134*  K 4.4 4.8 4.8 4.6 4.4  CL 99 109 108 110 107  CO2 18* 19* 22 19* 20*  GLUCOSE 100* 82 101* 104* 95  BUN 50* 38* 32* 25* 22  CREATININE 5.10* 4.24* 3.12* 2.43* 1.95*  CALCIUM 9.5 8.7* 8.1* 8.3* 8.5*    Liver Function Tests: Recent Labs  Lab 09/18/17 1925 09/19/17 0428  AST 72* 63*  ALT 76* 66*  ALKPHOS 98 90  BILITOT 0.7 1.4*  PROT 6.9 6.3*  ALBUMIN 4.1 3.6   No results for input(s): LIPASE, AMYLASE in the last 168  hours. No results for input(s): AMMONIA in the last 168 hours.  CBC: Recent Labs  Lab 09/18/17 1925 09/19/17 0428 09/20/17 0641 09/21/17 0410  WBC 11.1* 9.9 5.9  --   NEUTROABS 6.0  --   --   --   HGB 14.9 14.2 12.8*  --   HCT 42.6 40.4 36.3*  --   MCV 110.2* 109.9* 111.1*  --   PLT 180 130* 105* 104*    Cardiac Enzymes: No results for input(s): CKTOTAL, CKMB, CKMBINDEX, TROPONINI in the last 168 hours.  BNP: Invalid input(s): POCBNP  CBG: Recent Labs  Lab 09/18/17 2313 09/19/17 0749 09/20/17 0742 09/21/17 0754 09/22/17 0925  GLUCAP 88 76 99 113* 117*    Microbiology: No results found for this or any previous visit.  Coagulation Studies: No results for input(s): LABPROT, INR in the last 72 hours.  Urinalysis: No results for input(s): COLORURINE, LABSPEC, PHURINE, GLUCOSEU, HGBUR, BILIRUBINUR, KETONESUR, PROTEINUR, UROBILINOGEN, NITRITE, LEUKOCYTESUR in the last 72 hours.  Invalid input(s): APPERANCEUR    Imaging: Dg Chest 2 View  Result Date: 09/22/2017 CLINICAL DATA:  72 year old male with cough.  HIV, kidney failure. EXAM: CHEST - 2 VIEW COMPARISON:  Portable chest 09/18/2017. FINDINGS: Seated AP and lateral views of the chest. Lung volumes are near the upper limits of normal. Cardiac and  mediastinal contours are within normal limits. Tracheal air column is within normal limits. No pneumothorax, pulmonary edema, pleural effusion or confluent pulmonary opacity. Right side nipple shadow suspected. Borderline to mild increased pulmonary interstitial markings diffusely are stable. Osteopenia. Chronic left lateral 6th rib fracture suspected. No acute osseous abnormality identified. Postoperative changes to the right humeral head. Previous upper abdominal ventral hernia repair with mesh. Negative visible bowel gas pattern. IMPRESSION: Right nipple shadow suspected. No acute cardiopulmonary abnormality. Electronically Signed   By: Genevie Ann M.D.   On: 09/22/2017 11:12      Medications:    . bictegravir-emtricitabine-tenofovir AF  1 tablet Oral Daily  . docusate sodium  100 mg Oral BID  . folic acid  1 mg Oral Daily  . ipratropium-albuterol  3 mL Nebulization Q6H  . LORazepam  0-4 mg Intravenous Q12H  . LORazepam  0-4 mg Intravenous Q12H  . multivitamin with minerals  1 tablet Oral Daily  . nystatin   Topical BID  . pantoprazole  40 mg Oral Daily  . thiamine  100 mg Oral Daily   Or  . thiamine  100 mg Intravenous Daily  . traZODone  50 mg Oral QHS   acetaminophen **OR** acetaminophen, bisacodyl, guaiFENesin, HYDROcodone-acetaminophen, LORazepam **OR** LORazepam, menthol-cetylpyridinium, ondansetron **OR** ondansetron (ZOFRAN) IV, polyvinyl alcohol  Assessment/ Plan:  [name]. is a 72 y.o. white male with [name]. is a 72 y.o. white male with HIV, chronic kidney disease stage III, who was admitted to [facility] [place] on 09/18/2017 for acute renal failure  1. Acute renal failure on chronic kidney disease stage III  Baseline creatinine of 1.67, GFR of 41 on 05/19/17.  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 Followed by [facility], Dr. Radene Knee Acute renal failure secondary to Bactrim and prerenal azotemia - Holding lisinopril - monitor labs closely - S Creatinine closer to baseline        LOS: 4 Anthony Olson 7/22/20193:52 PM

## 2017-09-22 NOTE — Care Management Important Message (Signed)
Copy of signed IM left in patient's room (out for procedure). 

## 2017-09-23 LAB — GLUCOSE, CAPILLARY: Glucose-Capillary: 100 mg/dL — ABNORMAL HIGH (ref 70–99)

## 2017-09-23 NOTE — Discharge Summary (Signed)
[name]    MR#:  [id number]  [place]:  1946-01-30  DATE OF ADMISSION:  09/18/2017 ADMITTING PHYSICIAN: Amelia Jo, MD  DATE OF DISCHARGE: 09/23/2017 11:45 AM  PRIMARY CARE PHYSICIAN: Theotis Burrow, MD    ADMISSION DIAGNOSIS:  Syncope and collapse [R55] AKI (acute kidney injury) ([place]) [N17.9] Hypotension due to hypovolemia [I95.89, E86.1]  DISCHARGE DIAGNOSIS:  Active Problems:   Syncope   SECONDARY DIAGNOSIS:   Past Medical History:  Diagnosis Date  . Hernia, abdominal   . HIV infection (HCC)    Sees Dr. Ola Spurr for this  . Inguinal hernia   . Kidney failure   . Neoplasm of skin     HOSPITAL COURSE:   1.  Alcohol withdrawal.  Patient required a couple days of as needed Ativan while here in the hospital.  Patient seems better today  without tremor and better concentration.  Advised he must stop alcohol. 2.  Acute kidney injury on chronic kidney disease stage III.  Creatinine was 5.1 when he came in and down to 1.95.  Likely worsened because he was on Bactrim and drinking alcohol. 3.  Syncope likely related to alcohol intoxication, acute kidney injury and dehydration.  Patient was also orthostatic during the hospital course. 4.  Nasal bone fracture status post fall 5.  Loose teeth after fall.  He will need to follow-up with a dentist as outpatient to take out the root of one tooth and take out the other tooth. 6.  HIV positive can restart Biktarvy 7.  Fungal rash groin.  Nystatin prescribed. 8.  Elevated liver function test secondary to alcohol abuse. 9.  Drop in platelets secondary to alcohol use.  I stopped heparin subcutaneous injections. 10.  Insomnia.  I put on trazodone and he was sleeping better at night. 11.  Home health set up.  12.  Cough.  No signs of pneumonia on repeat chest x-ray.   DISCHARGE CONDITIONS:   Satisfactory  CONSULTS OBTAINED:  Treatment Team:   Lavonia Dana, MD Campbell Riches, MD  DRUG ALLERGIES:  No Known Allergies  DISCHARGE MEDICATIONS:   Allergies as of 09/23/2017   No Known Allergies     Medication List    STOP taking these medications   lisinopril 10 MG tablet Commonly known as:  PRINIVIL,ZESTRIL     TAKE these medications   acetaminophen 325 MG tablet Commonly known as:  TYLENOL Take 2 tablets (650 mg total) by mouth every 6 (six) hours as needed for mild pain (or Fever >/= 101).   BIKTARVY 50-200-25 MG Tabs tablet Generic drug:  bictegravir-emtricitabine-tenofovir AF Take 1 tablet by mouth daily.   folic acid 1 MG tablet Commonly known as:  FOLVITE Take 1 tablet (1 mg total) by mouth daily.   multivitamin with minerals Tabs tablet Take 1 tablet by mouth daily.   nystatin powder Commonly known as:  MYCOSTATIN/NYSTOP Apply topically 2 (two) times daily. Notes to patient:  TO RASH AROUND GROIN   thiamine 100 MG tablet Take 1 tablet (100 mg total) by mouth daily.   traZODone 50 MG tablet Commonly known as:  DESYREL Take 1 tablet (50 mg total) by mouth at bedtime as needed for sleep.        DISCHARGE INSTRUCTIONS:   Follow-up PMD 6 days Follow-up with Dr. Radene Knee nephrology as scheduled  If you experience worsening of your admission symptoms, develop shortness of breath, life threatening emergency,  suicidal or homicidal thoughts you must seek medical attention immediately by calling 911 or calling your MD immediately  if symptoms less severe.  You Must read complete instructions/literature along with all the possible adverse reactions/side effects for all the Medicines you take and that have been prescribed to you. Take any new Medicines after you have completely understood and accept all the possible adverse reactions/side effects.   Please note  You were cared for by a hospitalist during your hospital stay. If you have any questions about your discharge medications or the care you  received while you were in the hospital after you are discharged, you can call the unit and asked to speak with the hospitalist on call if the hospitalist that took care of you is not available. Once you are discharged, your primary care physician will handle any further medical issues. Please note that NO REFILLS for any discharge medications will be authorized once you are discharged, as it is imperative that you return to your primary care physician (or establish a relationship with a primary care physician if you do not have one) for your aftercare needs so that they can reassess your need for medications and monitor your lab values.    Today   CHIEF COMPLAINT:   Chief Complaint  Patient presents with  . Fall    HISTORY OF PRESENT ILLNESS:  Anthony Olson  is a 72 y.o. male presented after a fall.   VITAL SIGNS:  Blood pressure 103/82, pulse 94, temperature 97.7 F (36.5 C), temperature source Oral, resp. rate 20, height 5\' 10"  (1.778 m), weight 88.3 kg (194 lb 11.2 oz), SpO2 96 %.   PHYSICAL EXAMINATION:  GENERAL:  72 y.o.-year-old patient lying in the bed with no acute distress.  EYES: Pupils equal, round, reactive to light and accommodation. No scleral icterus. Extraocular muscles intact.  HEENT: Head atraumatic, normocephalic. Oropharynx and nasopharynx clear.  NECK:  Supple, no jugular venous distention. No thyroid enlargement, no tenderness.  LUNGS: Normal breath sounds bilaterally, no wheezing, rales,rhonchi or crepitation. No use of accessory muscles of respiration.  CARDIOVASCULAR: S1, S2 normal. No murmurs, rubs, or gallops.  ABDOMEN: Soft, non-tender, non-distended. Bowel sounds present. No organomegaly or mass.  EXTREMITIES: No pedal edema, cyanosis, or clubbing.  NEUROLOGIC: Cranial nerves II through XII are intact. Muscle strength 5/5 in all extremities. Sensation intact. Gait not checked.  PSYCHIATRIC: The patient is alert and oriented x 3.  SKIN: Dried blood on his  nose.  DATA REVIEW:   CBC Recent Labs  Lab 09/20/17 0641 09/21/17 0410  WBC 5.9  --   HGB 12.8*  --   HCT 36.3*  --   PLT 105* 104*    Chemistries  Recent Labs  Lab 09/19/17 0428  09/22/17 0351  NA 137   < > 134*  K 4.8   < > 4.4  CL 109   < > 107  CO2 19*   < > 20*  GLUCOSE 82   < > 95  BUN 38*   < > 22  CREATININE 4.24*   < > 1.95*  CALCIUM 8.7*   < > 8.5*  AST 63*  --   --   ALT 66*  --   --   ALKPHOS 90  --   --   BILITOT 1.4*  --   --    < > = values in this interval not displayed.      RADIOLOGY:  Dg Chest 2 View  Result Date:  09/22/2017 CLINICAL DATA:  72 year old male with cough.  HIV, kidney failure. EXAM: CHEST - 2 VIEW COMPARISON:  Portable chest 09/18/2017. FINDINGS: Seated AP and lateral views of the chest. Lung volumes are near the upper limits of normal. Cardiac and mediastinal contours are within normal limits. Tracheal air column is within normal limits. No pneumothorax, pulmonary edema, pleural effusion or confluent pulmonary opacity. Right side nipple shadow suspected. Borderline to mild increased pulmonary interstitial markings diffusely are stable. Osteopenia. Chronic left lateral 6th rib fracture suspected. No acute osseous abnormality identified. Postoperative changes to the right humeral head. Previous upper abdominal ventral hernia repair with mesh. Negative visible bowel gas pattern. IMPRESSION: Right nipple shadow suspected. No acute cardiopulmonary abnormality. Electronically Signed   By: Genevie Ann M.D.   On: 09/22/2017 11:12     Management plans discussed with the patient, and he is in agreement.  CODE STATUS:  Code Status History    Date Active Date Inactive Code Status Order ID Comments User Context   09/18/2017 2256 09/23/2017 1452 Full Code [id number]  Amelia Jo, MD Inpatient      TOTAL TIME TAKING CARE OF THIS PATIENT: 25minutes.   Loletha Grayer M.D on 09/23/2017 at 3:50 PM  Between 7am to 6pm - Pager - [phone]  After  6pm go to [email or web address] - password [email or web address]  [facility] Office  [phone]  CC: Primary care physician; Theotis Burrow, MD

## 2017-09-23 NOTE — Progress Notes (Signed)
[facility]  ROUNDING NOTE   Subjective:   Patient is doing fair today.  Denies any acute complaints.  Serum creatinine is down to 1.95 Urine output 1175 cc States he is able to eat some without nausea or vomiting Reports cough No leg edema   Objective:  Vital signs in last 24 hours:  Temp:  [97.7 F (36.5 C)-99.4 F (37.4 C)] 97.7 F (36.5 C) (07/23 0732) Pulse Rate:  [89-103] 94 (07/23 0732) Resp:  [16-20] 20 (07/23 0424) BP: (103-137)/(82-91) 103/82 (07/23 0732) SpO2:  [95 %-97 %] 96 % (07/23 0732) Weight:  [88.3 kg (194 lb 11.2 oz)] 88.3 kg (194 lb 11.2 oz) (07/23 0424)  Weight change: -1.885 kg (-4 lb 2.5 oz) Filed Weights   09/21/17 0355 09/22/17 0412 09/23/17 0424  Weight: 90.5 kg (199 lb 9.6 oz) 90.2 kg (198 lb 13.7 oz) 88.3 kg (194 lb 11.2 oz)    Intake/Output: I/O last 3 completed shifts: In: 840 [P.O.:840] Out: 2725 [Urine:2725]   Intake/Output this shift:  Total I/O In: -  Out: 300 [Urine:300]  Physical Exam: General: NAD, sitting up on side of bed, eating breakfast  Head: +fractured nose, +laceration, loose tooth  Eyes: Anicteric,   Neck: Supple,   Lungs:   decreased breath sounds  Heart: Regular rate and rhythm  Abdomen:  Soft, nontender  Extremities:  Trace peripheral edema.  Neurologic: alert, able to answer questions  Skin: Nasal laceration        Basic Metabolic Panel: Recent Labs  Lab 09/18/17 1925 09/19/17 0428 09/20/17 0641 09/21/17 0410 09/22/17 0351  NA 129* 137 134* 135 134*  K 4.4 4.8 4.8 4.6 4.4  CL 99 109 108 110 107  CO2 18* 19* 22 19* 20*  GLUCOSE 100* 82 101* 104* 95  BUN 50* 38* 32* 25* 22  CREATININE 5.10* 4.24* 3.12* 2.43* 1.95*  CALCIUM 9.5 8.7* 8.1* 8.3* 8.5*    Liver Function Tests: Recent Labs  Lab 09/18/17 1925 09/19/17 0428  AST 72* 63*  ALT 76* 66*  ALKPHOS 98 90  BILITOT 0.7 1.4*  PROT 6.9 6.3*  ALBUMIN 4.1 3.6   No results for input(s): LIPASE, AMYLASE in the last 168 hours. No  results for input(s): AMMONIA in the last 168 hours.  CBC: Recent Labs  Lab 09/18/17 1925 09/19/17 0428 09/20/17 0641 09/21/17 0410  WBC 11.1* 9.9 5.9  --   NEUTROABS 6.0  --   --   --   HGB 14.9 14.2 12.8*  --   HCT 42.6 40.4 36.3*  --   MCV 110.2* 109.9* 111.1*  --   PLT 180 130* 105* 104*    Cardiac Enzymes: No results for input(s): CKTOTAL, CKMB, CKMBINDEX, TROPONINI in the last 168 hours.  BNP: Invalid input(s): POCBNP  CBG: Recent Labs  Lab 09/19/17 0749 09/20/17 0742 09/21/17 0754 09/22/17 0925 09/23/17 0728  GLUCAP 76 99 113* 117* 100*    Microbiology: No results found for this or any previous visit.  Coagulation Studies: No results for input(s): LABPROT, INR in the last 72 hours.  Urinalysis: No results for input(s): COLORURINE, LABSPEC, PHURINE, GLUCOSEU, HGBUR, BILIRUBINUR, KETONESUR, PROTEINUR, UROBILINOGEN, NITRITE, LEUKOCYTESUR in the last 72 hours.  Invalid input(s): APPERANCEUR    Imaging: Dg Chest 2 View  Result Date: 09/22/2017 CLINICAL DATA:  72 year old male with cough.  HIV, kidney failure. EXAM: CHEST - 2 VIEW COMPARISON:  Portable chest 09/18/2017. FINDINGS: Seated AP and lateral views of the chest. Lung volumes are near the  upper limits of normal. Cardiac and mediastinal contours are within normal limits. Tracheal air column is within normal limits. No pneumothorax, pulmonary edema, pleural effusion or confluent pulmonary opacity. Right side nipple shadow suspected. Borderline to mild increased pulmonary interstitial markings diffusely are stable. Osteopenia. Chronic left lateral 6th rib fracture suspected. No acute osseous abnormality identified. Postoperative changes to the right humeral head. Previous upper abdominal ventral hernia repair with mesh. Negative visible bowel gas pattern. IMPRESSION: Right nipple shadow suspected. No acute cardiopulmonary abnormality. Electronically Signed   By: Genevie Ann M.D.   On: 09/22/2017 11:12      Medications:    . bictegravir-emtricitabine-tenofovir AF  1 tablet Oral Daily  . docusate sodium  100 mg Oral BID  . folic acid  1 mg Oral Daily  . ipratropium-albuterol  3 mL Nebulization Q6H  . LORazepam  0-4 mg Intravenous Q12H  . multivitamin with minerals  1 tablet Oral Daily  . nystatin   Topical BID  . pantoprazole  40 mg Oral Daily  . thiamine  100 mg Oral Daily   Or  . thiamine  100 mg Intravenous Daily  . traZODone  50 mg Oral QHS   acetaminophen **OR** acetaminophen, bisacodyl, guaiFENesin, HYDROcodone-acetaminophen, LORazepam **OR** LORazepam, menthol-cetylpyridinium, ondansetron **OR** ondansetron (ZOFRAN) IV, polyvinyl alcohol  Assessment/ Plan:  [name]. is a 72 y.o. white male with [name]. is a 72 y.o. white male with HIV, chronic kidney disease stage III, who was admitted to [facility] on 09/18/2017 for acute renal failure  1. Acute renal failure on chronic kidney disease stage III  Baseline creatinine of 1.67, GFR of 41 on 05/19/17.  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  Followed by [facility]phrology, Dr. Radene Knee Acute renal failure secondary to Bactrim and prerenal azotemia - Holding lisinopril- may restart outpatient once S creatinine is stable         LOS: 5 Lotoya Casella 7/23/201910:54 AM

## 2017-09-23 NOTE — Progress Notes (Signed)
Physical Therapy Treatment Patient Details Name: Anthony Olson. MRN: [id number] DOB: 1945/12/08 Today's Date: 09/23/2017    History of Present Illness Anthony Olson is a 72yo male who comes to [facility] after LOB, fall forward onto face, quesitonal syncope. Pt sustained multiple facial/dental fractures. PMH: HIV, kidney failure, ETOH abuse, bilat TKA, THA, rotator cuff surgery. At baseline pt lives alone, no self described balance deficits, but pt endorses about 5 falls in past 6 months with que4stionable syncope playin a role in each.     PT Comments    Ready for session.  No difficulty with bed mobility today.  Stood and was able to ambulate to door.  Pt noted to be holding and reaching for walls to steady himself.  Stated he felt unsteady and would prefer to use RW for balance.  Returned to bed while I obtained walker.  With walker, he was able to stand and walk around unit with min guard/supervision.  Stated he has a walker at home and uses it at times.  Encouraged it's use upon discharge and he voiced understanding.    Follow Up Recommendations  Home health PT     Equipment Recommendations  None recommended by PT    Recommendations for Other Services       Precautions / Restrictions Precautions Precautions: Fall Restrictions Weight Bearing Restrictions: No    Mobility  Bed Mobility Overal bed mobility: Modified Independent       Supine to sit: Supervision        Transfers Overall transfer level: Needs assistance Equipment used: None;Rolling walker (2 wheeled) Transfers: Sit to/from Stand Sit to Stand: Supervision            Ambulation/Gait Ambulation/Gait assistance: Supervision;Min guard Gait Distance (Feet): 180 Feet Assistive device: None;Rolling walker (2 wheeled) Gait Pattern/deviations: Wide base of support Gait velocity: decreased       Stairs             Wheelchair Mobility    Modified Rankin (Stroke Patients Only)       Balance  Overall balance assessment: History of Falls;Modified Independent                                          Cognition Arousal/Alertness: Awake/alert Behavior During Therapy: WFL for tasks assessed/performed Overall Cognitive Status: Within Functional Limits for tasks assessed                                        Exercises      General Comments        Pertinent Vitals/Pain Pain Assessment: No/denies pain    Home Living                      Prior Function            PT Goals (current goals can now be found in the care plan section) Progress towards PT goals: Progressing toward goals    Frequency    Min 2X/week      PT Plan Current plan remains appropriate    Co-evaluation              AM-PAC PT "6 Clicks" Daily Activity  Outcome Measure  Difficulty turning over in bed (including adjusting bedclothes, sheets and blankets)?: None Difficulty  moving from lying on back to sitting on the side of the bed? : None Difficulty sitting down on and standing up from a chair with arms (e.g., wheelchair, bedside commode, etc,.)?: None Help needed moving to and from a bed to chair (including a wheelchair)?: None Help needed walking in hospital room?: A Little Help needed climbing 3-5 steps with a railing? : A Little 6 Click Score: 22    End of Session Equipment Utilized During Treatment: Gait belt Activity Tolerance: Patient tolerated treatment well Patient left: in bed;with bed alarm set;with call bell/phone within reach         Time: 0949-1000 PT Time Calculation (min) (ACUTE ONLY): 11 min  Charges:  $Gait Training: 8-22 mins                    G Codes:       Chesley Noon, PTA 09/23/17, 11:01 AM

## 2017-09-23 NOTE — Care Management (Signed)
Notified [facility] of discharge today and orders for nursing and physical therapy

## 2017-09-23 NOTE — Progress Notes (Signed)
Home health PT and RN have been set up for him.  His prescriptions were sent to [facility] yesterday so should be ready for pick up.  He says he has both multivitamins and tylenol at home.  He can't find the phone number for his friend so will take and [place] home.

## 2017-09-26 ENCOUNTER — Telehealth: Payer: Self-pay

## 2017-09-26 NOTE — Telephone Encounter (Signed)
Flagged on EMMI report for not receiving discharge papers and not knowing who to call about changes in condition.  Called and spoke with patient. He mentioned he wasn't sure if he had gotten his papers. I asked if he would like a copy sent to him and he said yes. Confirmed address to send discharge papers to.   Reviewed follow up appointments and encouraged him to reach out to his PCP for any questions or changes in his condition.  I thanked him for his time and informed him that he would receive one more automated call checking on him in the next few days.

## 2017-09-29 ENCOUNTER — Telehealth: Payer: Self-pay | Admitting: General Practice

## 2017-09-29 NOTE — Telephone Encounter (Signed)
Please schedule an appointment with Dr. Rosana Hoes. Thank you.

## 2017-09-29 NOTE — Telephone Encounter (Signed)
Patients coming in on 10/02/17

## 2017-09-29 NOTE — Telephone Encounter (Signed)
Patient came by the office and saw Dr. Michael Boston for a hernia patient came by the office and is wanting to get in with a surgeron please call patient and advise.

## 2017-10-01 ENCOUNTER — Emergency Department
Admission: EM | Admit: 2017-10-01 | Discharge: 2017-10-01 | Disposition: A | Discharge status: Home / Self Care | Payer: Medicare Other | Attending: Emergency Medicine | Admitting: Emergency Medicine

## 2017-10-01 ENCOUNTER — Other Ambulatory Visit: Payer: Self-pay

## 2017-10-01 DIAGNOSIS — K0889 Other specified disorders of teeth and supporting structures: Secondary | ICD-10-CM | POA: Insufficient documentation

## 2017-10-01 DIAGNOSIS — Z96652 Presence of left artificial knee joint: Secondary | ICD-10-CM | POA: Diagnosis not present

## 2017-10-01 DIAGNOSIS — N189 Chronic kidney disease, unspecified: Secondary | ICD-10-CM | POA: Insufficient documentation

## 2017-10-01 DIAGNOSIS — B20 Human immunodeficiency virus [HIV] disease: Secondary | ICD-10-CM | POA: Diagnosis not present

## 2017-10-01 DIAGNOSIS — Z96651 Presence of right artificial knee joint: Secondary | ICD-10-CM | POA: Insufficient documentation

## 2017-10-01 DIAGNOSIS — Z85828 Personal history of other malignant neoplasm of skin: Secondary | ICD-10-CM | POA: Diagnosis not present

## 2017-10-01 DIAGNOSIS — Z87898 Personal history of other specified conditions: Secondary | ICD-10-CM | POA: Insufficient documentation

## 2017-10-01 DIAGNOSIS — R799 Abnormal finding of blood chemistry, unspecified: Secondary | ICD-10-CM | POA: Diagnosis present

## 2017-10-01 DIAGNOSIS — Z87891 Personal history of nicotine dependence: Secondary | ICD-10-CM | POA: Insufficient documentation

## 2017-10-01 DIAGNOSIS — N289 Disorder of kidney and ureter, unspecified: Secondary | ICD-10-CM | POA: Diagnosis not present

## 2017-10-01 LAB — BASIC METABOLIC PANEL
Anion gap: 10 (ref 5–15)
Anion gap: 12 (ref 5–15)
BUN: 26 mg/dL — ABNORMAL HIGH (ref 8–23)
BUN: 25 mg/dL — ABNORMAL HIGH (ref 8–23)
CO2: 22 mmol/L (ref 22–32)
CO2: 20 mmol/L — ABNORMAL LOW (ref 22–32)
Calcium: 9.1 mg/dL (ref 8.9–10.3)
Calcium: 9 mg/dL (ref 8.9–10.3)
Chloride: 105 mmol/L (ref 98–111)
Chloride: 105 mmol/L (ref 98–111)
Creatinine, Ser: 3.07 mg/dL — ABNORMAL HIGH (ref 0.61–1.24)
Creatinine, Ser: 2.98 mg/dL — ABNORMAL HIGH (ref 0.61–1.24)
GFR calc Af Amer: 22 mL/min — ABNORMAL LOW (ref >60)
GFR calc Af Amer: 23 mL/min — ABNORMAL LOW (ref >60)
GFR calc non Af Amer: 19 mL/min — ABNORMAL LOW (ref >60)
GFR calc non Af Amer: 20 mL/min — ABNORMAL LOW (ref >60)
Glucose, Bld: 92 mg/dL (ref 70–99)
Glucose, Bld: 85 mg/dL (ref 70–99)
Potassium: 3.6 mmol/L (ref 3.5–5.1)
Potassium: 3.6 mmol/L (ref 3.5–5.1)
Sodium: 137 mmol/L (ref 135–145)
Sodium: 137 mmol/L (ref 135–145)

## 2017-10-01 LAB — CBC
HCT: 38.1 % — ABNORMAL LOW (ref 40.0–52.0)
Hemoglobin: 13.5 g/dL (ref 13.0–18.0)
MCH: 38.8 pg — ABNORMAL HIGH (ref 26.0–34.0)
MCHC: 35.3 g/dL (ref 32.0–36.0)
MCV: 109.9 fL — ABNORMAL HIGH (ref 80.0–100.0)
Platelets: 233 10*3/uL (ref 150–440)
RBC: 3.47 MIL/uL — ABNORMAL LOW (ref 4.40–5.90)
RDW: 13 % (ref 11.5–14.5)
WBC: 9.1 10*3/uL (ref 3.8–10.6)

## 2017-10-01 LAB — TROPONIN I: Troponin I: <0.03 ng/mL (ref <0.03)

## 2017-10-01 LAB — ETHANOL: Alcohol, Ethyl (B): 108 mg/dL — ABNORMAL HIGH (ref <10)

## 2017-10-01 MED ORDER — SODIUM CHLORIDE 0.9 % IV SOLN
Freq: Once | INTRAVENOUS | Status: AC
  Administered 2017-10-01: 16:00:00 via INTRAVENOUS

## 2017-10-01 NOTE — ED Triage Notes (Signed)
Pt arrives to ED stating that PCP did blood work yesterday and told him to come to ED d/t kidney failure. Was recently admitted for kidney failure. C/o of teeth pain d/t fall and knocking teeth out recently. Alert, oriented ,ambulatory. States using alcohol as a pain killer, drank vodka 1 hr PTA.

## 2017-10-01 NOTE — ED Provider Notes (Signed)
[facility] Emergency Department Provider Note       Time seen: ----------------------------------------- 4:01 PM on 10/01/2017 -----------------------------------------   I have reviewed the triage vital signs and the nursing notes.  HISTORY   Chief Complaint Abnormal Lab (kidneys )    HPI Anthony Olson. is a 72 y.o. male with a history of abdominal hernia, HIV, kidney failure, chronic alcohol abuse who presents to the ED for abnormal lab work.  Patient presents being told to come here by his primary care doctor for abnormal blood work that was done yesterday.  He was told he was in renal failure, he was recently admitted for this.  He is complaining of tooth pain due to a recent fall.  Patient states she is using alcohol as a painkiller and drank vodka 1 hour prior to arrival.  Past Medical History:  Diagnosis Date  . Hernia, abdominal   . HIV infection (HCC)    Sees Dr. Ola Spurr for this  . Inguinal hernia   . Kidney failure   . Neoplasm of skin     Patient Active Problem List   Diagnosis Date Noted  . Syncope 09/18/2017  . 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 Thrombocytopenia ([place]) 07/19/2016  . Right inguinal hernia 02/01/2015  . Right hip pain 02/01/2015  . H/O neoplasm 10/03/2014  . 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 LBP (low back pain) 03/11/2014  . Acute confusion 02/17/2014  . Acute renal failure ([place]) 02/17/2014  . Arterial blood pressure decreased 02/17/2014  . Elevated WBC count 02/17/2014  . Anxiety and depression 02/15/2014  . Personal history of other infectious and parasitic diseases 02/15/2014  . Arthritis of knee, degenerative 12/13/2013  . Acid reflux 08/02/2013  . Human immunodeficiency virus (HIV) infection ([place]) 08/02/2013    Past Surgical History:  Procedure Laterality Date  . CARPAL TUNNEL RELEASE Left   . HERNIA REPAIR  1997   Left Lower Abdomen- [place]  . HERNIA  REPAIR  7412   [place]  . INGUINAL HERNIA REPAIR Left 1996   [place]  . INGUINAL HERNIA REPAIR Right 1993   [place]  . LAMINECTOMY    . OPEN ANTERIOR SHOULDER RECONSTRUCTION Right 08/2013   [facility]  . REPLACEMENT TOTAL KNEE Right   . REPLACEMENT TOTAL KNEE Left    [facility]  . UMBILICAL HERNIA REPAIR  1997   [place]    Allergies Patient has no known allergies.  Social History Social History   Tobacco Use  . Smoking status: Former Smoker    Last attempt to quit: 06/18/2013    Years since quitting: 4.2  . Smokeless tobacco: Never Used  Substance Use Topics  . Alcohol use: Yes    Comment: Pt states he drinks 1/5 day   . Drug use: No    Comment: Use to do cocaine 30 years ago   Review of Systems Constitutional: Negative for fever. Cardiovascular: Negative for chest pain. Respiratory: Negative for shortness of breath. Gastrointestinal: Negative for abdominal pain, vomiting and diarrhea. Musculoskeletal: Negative for back pain. Skin: Negative for rash. Neurological: Negative for headaches, focal weakness or numbness.  All systems negative/normal/unremarkable except as stated in the HPI  ____________________________________________   PHYSICAL EXAM:  VITAL SIGNS: ED Triage Vitals  Enc Vitals Group     BP 10/01/17 1506 101/61     Pulse Rate 10/01/17 1506 87     Resp 10/01/17 1506 18     Temp 10/01/17  1506 98.3 F (36.8 C)     Temp Source 10/01/17 1506 Oral     SpO2 10/01/17 1506 96 %     Weight 10/01/17 1507 194 lb (88 kg)     Height 10/01/17 1507 5\' 10"  (1.778 m)     Head Circumference --      Peak Flow --      Pain Score 10/01/17 1506 8     Pain Loc --      Pain Edu? --      Excl. in [place]? --    Constitutional: Alert and oriented. Well appearing and in no distress. Eyes: Conjunctivae are normal. Normal extraocular movements. ENT   Head: Normocephalic and atraumatic.   Nose: No congestion/rhinnorhea.   Mouth/Throat: Mucous  membranes are moist.  Right central incisor is loose from recent trauma   Neck: No stridor. Cardiovascular: Normal rate, regular rhythm. No murmurs, rubs, or gallops. Respiratory: Normal respiratory effort without tachypnea nor retractions. Breath sounds are clear and equal bilaterally. No wheezes/rales/rhonchi. Gastrointestinal: Soft and nontender. Normal bowel sounds Musculoskeletal: Nontender with normal range of motion in extremities. No lower extremity tenderness nor edema. Neurologic:  Normal speech and language. No gross focal neurologic deficits are appreciated.  Skin:  Skin is warm, dry and intact. No rash noted. Psychiatric: Mood and affect are normal. Speech and behavior are normal.  ____________________________________________  EKG: Interpreted by me.  Sinus rhythm rate 86 bpm, normal PR interval, normal QRS, normal QT  ____________________________________________  ED COURSE:  As part of my medical decision making, I reviewed the following data within the [place] History obtained from family if available, nursing notes, old chart and ekg, as well as notes from prior ED visits. Patient presented for possible renal failure, we will assess with labs, give IV fluids and recheck his kidney function.   Procedures ____________________________________________   LABS (pertinent positives/negatives)  Labs Reviewed  BASIC METABOLIC PANEL - Abnormal; Notable for the following components:      Result Value   BUN 26 (*)    Creatinine, Ser 3.07 (*)    GFR calc non Af Amer 19 (*)    GFR calc Af Amer 22 (*)    All other components within normal limits  CBC - Abnormal; Notable for the following components:   RBC 3.47 (*)    HCT 38.1 (*)    MCV 109.9 (*)    MCH 38.8 (*)    All other components within normal limits  ETHANOL - Abnormal; Notable for the following components:   Alcohol, Ethyl (B) 108 (*)    All other components within normal limits  BASIC  METABOLIC PANEL - Abnormal; Notable for the following components:   CO2 20 (*)    BUN 25 (*)    Creatinine, Ser 2.98 (*)    GFR calc non Af Amer 20 (*)    GFR calc Af Amer 23 (*)    All other components within normal limits  TROPONIN I   ___________________________________________  DIFFERENTIAL DIAGNOSIS   Renal failure, alcohol use disorder, dehydration, acute kidney injury  FINAL ASSESSMENT AND PLAN  Chronic kidney disease   Plan: The patient had presented for abnormal lab work concerning his kidney function test. Patient's labs did initially indicate worsening creatinine relative to recent hospital admission.  Patient does not want to be admitted into the hospital.  I did discuss with his primary care doctor who will see him in the morning at 8:20 AM.  He was  given IV fluids here and at least his creatinine was going in the right direction.   Laurence Aly, MD   Note: This note was generated in part or whole with voice recognition software. Voice recognition is usually quite accurate but there are transcription errors that can and very often do occur. I apologize for any typographical errors that were not detected and corrected.     Earleen [place], MD 10/01/17 [phone]

## 2017-10-02 ENCOUNTER — Other Ambulatory Visit: Payer: Self-pay

## 2017-10-02 ENCOUNTER — Encounter: Payer: Self-pay | Admitting: Surgery

## 2017-10-02 ENCOUNTER — Ambulatory Visit (INDEPENDENT_AMBULATORY_CARE_PROVIDER_SITE_OTHER): Payer: Medicare Other | Admitting: Surgery

## 2017-10-02 VITALS — BP 151/91 | HR 85 | Temp 98.1°F | Ht 70.0 in | Wt 195.6 lb

## 2017-10-02 DIAGNOSIS — K4091 Unilateral inguinal hernia, without obstruction or gangrene, recurrent: Secondary | ICD-10-CM | POA: Diagnosis not present

## 2017-10-02 NOTE — Progress Notes (Signed)
Surgical Clinic History and Physical  Referring provider:  Theotis Burrow, MD [address] [place], [place] [address]  HISTORY OF PRESENT ILLNESS (HPI):  72 y.o. male presents for evaluation of his chronic recurrent inguinal hernia. Patient reports he had B/L inguinal hernias repaired with mesh (open repair) at different times 25 years ago while living in [place], [place]. He's since developed a constant Right groin bulge x at least several years, for which he was evaluated 2.5 years ago (01/2015) by Dr. Azalee Course. He states the hernia does not really bother him unless a physician pushes on it, and he denies attempting to self-reduce it. He denies any similar bulge of his Left groin. However, he has recently developed a moderately severe Left groin fungal infection for which he is "supposed to schedule" an appointment with his infectious disease physician to evaluate and treat. Patient also reports he drinks just under a fifth of vodka every day since cutting back somewhat after he presented intoxicated to [facility] ED and was admitted to [facility] following a fall with broken teeth and nose. Patient denies constipation, blood per rectum, straining with urination, or frequent cough. He does mention that he feels his abdomen has increased in size despite having recently lost ~13 lbs while admitted to [facility] as described above.  PAST MEDICAL HISTORY (PMH):  Past Medical History:  Diagnosis Date  . Hernia, abdominal   . HIV infection (HCC)    Sees Dr. Ola Spurr for this  . Inguinal hernia   . Kidney failure   . Neoplasm of skin      PAST SURGICAL HISTORY ([place]):  Past Surgical History:  Procedure Laterality Date  . CARPAL TUNNEL RELEASE Left   . HERNIA REPAIR  1997   Left Lower Abdomen[place]  . HERNIA REPAIR  [id number]   [place]  . INGUINAL HERNIA REPAIR Left 1996   [place]  . INGUINAL HERNIA REPAIR Right 1993   [place]  . LAMINECTOMY    . OPEN ANTERIOR  SHOULDER RECONSTRUCTION Right 08/2013   [facility]  . REPLACEMENT TOTAL KNEE Right   . REPLACEMENT TOTAL KNEE Left    [facility]  . UMBILICAL HERNIA REPAIR  1997   [place]     MEDICATIONS:  Prior to Admission medications   Medication Sig Start Date End Date Taking? Authorizing Provider  bictegravir-emtricitabine-tenofovir AF (BIKTARVY) 50-200-25 MG TABS tablet Take 1 tablet by mouth daily.  04/21/17  Yes [provider]  clotrimazole-betamethasone (LOTRISONE) cream  09/08/17  Yes [provider]  cyclobenzaprine (FLEXERIL) 10 MG tablet Take by mouth.   Yes [provider]  folic acid (FOLVITE) 1 MG tablet Take 1 tablet (1 mg total) by mouth daily. 09/23/17  Yes Wieting, Richard, MD  Multiple Vitamin (MULTIVITAMIN WITH MINERALS) TABS tablet Take 1 tablet by mouth daily. 09/23/17  Yes Wieting, Richard, MD  nystatin (MYCOSTATIN/NYSTOP) powder Apply topically 2 (two) times daily. 09/22/17  Yes Wieting, Richard, MD  thiamine 100 MG tablet Take 1 tablet (100 mg total) by mouth daily. 09/23/17  Yes Loletha Grayer, MD  traZODone (DESYREL) 50 MG tablet Take 1 tablet (50 mg total) by mouth at bedtime as needed for sleep. 09/22/17  Yes Loletha Grayer, MD     ALLERGIES:  No Known Allergies   SOCIAL HISTORY:  Social History   Socioeconomic History  . Marital status: Single    Spouse name: Not on file  . Number of children: Not on file  . Years of education:  Not on file  . Highest education level: Not on file  Occupational History  . Not on file  Social Needs  . Financial resource strain: Not on file  . Food insecurity:    Worry: Not on file    Inability: Not on file  . Transportation needs:    Medical: Not on file    Non-medical: Not on file  Tobacco Use  . Smoking status: Former Smoker    Last attempt to quit: 06/18/2013    Years since quitting: 4.2  . Smokeless tobacco: Never Used  Substance and Sexual Activity  . Alcohol use: Yes    Comment: Pt states he drinks  1/5 day   . Drug use: No    Comment: Use to do cocaine 30 years ago  . Sexual activity: Not on file  Lifestyle  . Physical activity:    Days per week: Not on file    Minutes per session: Not on file  . Stress: Not on file  Relationships  . Social connections:    Talks on phone: Not on file    Gets together: Not on file    Attends religious service: Not on file    Active member of club or organization: Not on file    Attends meetings of clubs or organizations: Not on file    Relationship status: Not on file  . Intimate partner violence:    Fear of current or ex partner: Not on file    Emotionally abused: Not on file    Physically abused: Not on file    Forced sexual activity: Not on file  Other Topics Concern  . Not on file  Social History Narrative  . Not on file    The patient currently resides (home / rehab facility / nursing home): Home The patient normally is (ambulatory / bedbound): Ambulatory  FAMILY HISTORY:  Family History  Problem Relation Age of Onset  . Diabetes Mother   . CAD Father 31    Otherwise negative/non-contributory.  REVIEW OF SYSTEMS:  Constitutional: denies any other weight loss, fever, chills, or sweats  Eyes: denies any other vision changes, history of eye injury  ENT: denies sore throat, hearing problems  Respiratory: denies shortness of breath, wheezing  Cardiovascular: denies chest pain, palpitations  Gastrointestinal: abdominal pain, N/V, and bowel function as per HPI Musculoskeletal: denies any other joint pains or cramps  Skin: Denies any other rashes or skin discolorations except as per HPI Neurological: denies any other headache, dizziness, weakness  Psychiatric: Denies any other depression, anxiety   All other review of systems were otherwise negative   VITAL SIGNS:  BP (!) 151/91   Pulse 85   Temp 98.1 F (36.7 C) (Oral)   Ht 5\' 10"  (1.778 m)   Wt 195 lb 9.6 oz (88.7 kg)   BMI 28.07 kg/m   PHYSICAL EXAM:   Constitutional:  -- Normal body habitus  -- Awake, alert, and oriented x3  Eyes:  -- Pupils equally round and reactive to light  -- No scleral icterus  Ear, nose, throat:  -- Very poor dentition -- No jugular venous distension -- No nasal drainage, bleeding Pulmonary:  -- No crackles  -- Equal breath sounds bilaterally -- Breathing non-labored at rest Cardiovascular:  -- S1, S2 present  -- No pericardial rubs  Gastrointestinal:  -- Abdomen soft and nontender, though rotund with no guarding/rebound tenderness  -- Multiple well-healed mid-line, peri-umbilical, and B/L groin linear post-surgical incisional scars, no abdominal masses appreciated,  pulsatile or otherwise, except moderately tender and only partially reducible recurrent Right inguinal hernia  Musculoskeletal and Integumentary:  -- Wounds or skin discoloration: Left groin erythema and multiple focal scars where patient identifies he's been picking and scratching due to itching associated with known fungal cellulitis, no evidence of more superior mesh infection -- Extremities: B/L UE and LE FROM, hands and feet warm  Neurologic:  -- Motor function: Intact and symmetric -- Sensation: Intact and symmetric  Labs:  CBC Latest Ref Rng & Units 10/01/2017 09/21/2017 09/20/2017  WBC 3.8 - 10.6 K/uL 9.1 - 5.9  Hemoglobin 13.0 - 18.0 g/dL 13.5 - 12.8(L)  Hematocrit 40.0 - 52.0 % 38.1(L) - 36.3(L)  Platelets 150 - 440 K/uL 233 104(L) 105(L)   CMP Latest Ref Rng & Units 10/01/2017 10/01/2017 09/22/2017  Glucose 70 - 99 mg/dL 85 92 95  BUN 8 - 23 mg/dL 25(H) 26(H) 22  Creatinine 0.61 - 1.24 mg/dL 2.98(H) 3.07(H) 1.95(H)  Sodium 135 - 145 mmol/L 137 137 134(L)  Potassium 3.5 - 5.1 mmol/L 3.6 3.6 4.4  Chloride 98 - 111 mmol/L 105 105 107  CO2 22 - 32 mmol/L 20(L) 22 20(L)  Calcium 8.9 - 10.3 mg/dL 9.0 9.1 8.5(L)  Total Protein 6.5 - 8.1 g/dL - - -  Total Bilirubin 0.3 - 1.2 mg/dL - - -  Alkaline Phos 38 - 126 U/L - - -  AST 15 - 41  U/L - - -  ALT 0 - 44 U/L - - -    Imaging studies: No new pertinent imaging studies available for review   Assessment/Plan:  72 y.o. male with chronic essentially (reportedly) asymptomatic likely incarcerated recurrent Right inguinal hernia, complicated by co-morbidities including excessive alcohol abuse and dependence, well-controlled HIV+, Left groin fungal cellulitis pending ID evaluation/management, stage 3 CKD, HTN, monoclonal gammopathy of unclear significance (followed by hematology/oncology), osteoarthritis, thrombocytopenia, GERD, generalized anxiety disorder, and major depression disorder.   - signs and symptoms of incarcerated hernia and obstruction discussed  - strategies reviewed for manual self-reduction of patient's recurrent Right inguinal hernia   - all risks, benefits, and alternatives to elective laparoscopic repair of recurrent likely incarcerated Right inguinal hernia was discussed with the patient, all of his questions were answered to his expressed satisfaction, patient expresses is not currently interested in repair of his hernia  - if repair of patient's recurrent and likely incarcerated were desired and to be considered, patient would need to abstain from alcohol and active Left groin fungal cellulitis (or any other similar infection) would need to have resolved   - medical management of Left groin fungal cellulitis and comorbidities as per primary care physician and ID  - return to clinic as needed, instructed to call if any questions or concerns  - alcohol cessation strongly encouraged and discussed  All of the above recommendations were discussed with the patient, and all of patient's questions were answered to his expressed satisfaction.  Thank you for the opportunity to participate in this patient's care.  -- Marilynne Drivers Rosana Hoes, MD, [place]: [facility] - Partnering for exceptional care. Office: [phone]

## 2017-10-02 NOTE — Patient Instructions (Addendum)
Please call our office if you decide to move forward with having surgery. °

## 2017-10-28 ENCOUNTER — Emergency Department: Payer: Medicare Other

## 2017-10-28 ENCOUNTER — Emergency Department
Admission: EM | Admit: 2017-10-28 | Discharge: 2017-10-29 | Disposition: A | Discharge status: Home / Self Care | Payer: Medicare Other | Attending: Emergency Medicine | Admitting: Emergency Medicine

## 2017-10-28 ENCOUNTER — Other Ambulatory Visit: Payer: Self-pay

## 2017-10-28 DIAGNOSIS — B20 Human immunodeficiency virus [HIV] disease: Secondary | ICD-10-CM | POA: Diagnosis not present

## 2017-10-28 DIAGNOSIS — I129 Hypertensive chronic kidney disease with stage 1 through stage 4 chronic kidney disease, or unspecified chronic kidney disease: Secondary | ICD-10-CM | POA: Diagnosis not present

## 2017-10-28 DIAGNOSIS — N189 Chronic kidney disease, unspecified: Secondary | ICD-10-CM | POA: Diagnosis not present

## 2017-10-28 DIAGNOSIS — R339 Retention of urine, unspecified: Secondary | ICD-10-CM | POA: Diagnosis present

## 2017-10-28 DIAGNOSIS — Z87891 Personal history of nicotine dependence: Secondary | ICD-10-CM | POA: Insufficient documentation

## 2017-10-28 DIAGNOSIS — Z79899 Other long term (current) drug therapy: Secondary | ICD-10-CM | POA: Insufficient documentation

## 2017-10-28 DIAGNOSIS — J40 Bronchitis, not specified as acute or chronic: Secondary | ICD-10-CM | POA: Diagnosis not present

## 2017-10-28 LAB — COMPREHENSIVE METABOLIC PANEL
ALT: 50 U/L — ABNORMAL HIGH (ref 0–44)
AST: 59 U/L — ABNORMAL HIGH (ref 15–41)
Albumin: 3.8 g/dL (ref 3.5–5.0)
Alkaline Phosphatase: 89 U/L (ref 38–126)
Anion gap: 6 (ref 5–15)
BUN: 20 mg/dL (ref 8–23)
CO2: 23 mmol/L (ref 22–32)
Calcium: 8.6 mg/dL — ABNORMAL LOW (ref 8.9–10.3)
Chloride: 102 mmol/L (ref 98–111)
Creatinine, Ser: 1.65 mg/dL — ABNORMAL HIGH (ref 0.61–1.24)
GFR calc Af Amer: 47 mL/min — ABNORMAL LOW (ref >60)
GFR calc non Af Amer: 41 mL/min — ABNORMAL LOW (ref >60)
Glucose, Bld: 123 mg/dL — ABNORMAL HIGH (ref 70–99)
Potassium: 3.9 mmol/L (ref 3.5–5.1)
Sodium: 131 mmol/L — ABNORMAL LOW (ref 135–145)
Total Bilirubin: 0.8 mg/dL (ref 0.3–1.2)
Total Protein: 6.5 g/dL (ref 6.5–8.1)

## 2017-10-28 LAB — CBC WITH DIFFERENTIAL/PLATELET
Basophils Absolute: 0 10*3/uL (ref 0–0.1)
Basophils Relative: 1 %
Eosinophils Absolute: 0 10*3/uL (ref 0–0.7)
Eosinophils Relative: 0 %
HCT: 36.7 % — ABNORMAL LOW (ref 40.0–52.0)
Hemoglobin: 13.2 g/dL (ref 13.0–18.0)
Lymphocytes Relative: 12 %
Lymphs Abs: 0.6 10*3/uL — ABNORMAL LOW (ref 1.0–3.6)
MCH: 38 pg — ABNORMAL HIGH (ref 26.0–34.0)
MCHC: 35.9 g/dL (ref 32.0–36.0)
MCV: 105.9 fL — ABNORMAL HIGH (ref 80.0–100.0)
Monocytes Absolute: 0.5 10*3/uL (ref 0.2–1.0)
Monocytes Relative: 11 %
Neutro Abs: 3.4 10*3/uL (ref 1.4–6.5)
Neutrophils Relative %: 76 %
Platelets: 171 10*3/uL (ref 150–440)
RBC: 3.47 MIL/uL — ABNORMAL LOW (ref 4.40–5.90)
RDW: 13.4 % (ref 11.5–14.5)
WBC: 4.4 10*3/uL (ref 3.8–10.6)

## 2017-10-28 LAB — LACTIC ACID, PLASMA: Lactic Acid, Venous: 1.5 mmol/L (ref 0.5–1.9)

## 2017-10-28 NOTE — ED Provider Notes (Addendum)
[facility] Emergency Department Provider Note  Seen: 11:15 PM I have reviewed the triage vital signs and the nursing notes.   HISTORY  Chief Complaint Urinary Retention    HPI Anthony Olson. is a 72 y.o. male Anthony Olson to the emergency department with multiple medical complaints via EMS.  Patient admits to difficulty urinating tonight as well as productive cough for the past 3 days.  Patient admits to subjective fevers at home temperature for EMS 99.5.  Patient denies any dysuria however does admit to urinary frequency.   Past Medical History:  Diagnosis Date  . Hernia, abdominal   . HIV infection (HCC)    Sees Dr. Ola Spurr for this  . Inguinal hernia   . Kidney failure   . Neoplasm of skin     Patient Active Problem List   Diagnosis Date Noted  . Unilateral recurrent inguinal hernia without obstruction or gangrene 10/02/2017  . Syncope 09/18/2017  . 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 Thrombocytopenia ([place]) 07/19/2016  . Macrocytosis 03/20/2015  . Kidney disease 03/20/2015  . Peripheral vascular disease ([place]) 03/20/2015  . Hypertensive disorder 03/20/2015  . Peripheral neuritis 03/20/2015  . Seborrheic dermatitis 03/20/2015  . Right inguinal hernia 02/01/2015  . Right hip pain 02/01/2015  . H/O neoplasm 10/03/2014  . History of nonmelanoma skin cancer 10/03/2014  . 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 Spondylosis of lumbar region without myelopathy or radiculopathy 04/25/2014  . 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 LBP (low back pain) 03/11/2014  . Acute confusion 02/17/2014  . Acute renal failure ([place]) 02/17/2014  . Arterial blood pressure decreased 02/17/2014  . Elevated WBC count 02/17/2014  . Hypotension 02/17/2014  . Anxiety and depression 02/15/2014  . Personal history of other infectious and parasitic diseases 02/15/2014  . 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    . Arthritis of knee, degenerative 12/13/2013  . Derangement of posterior horn of medial meniscus due to old injury, left 12/13/2013  . Primary osteoarthritis of left knee 12/13/2013  . Subacromial impingement of right shoulder 08/19/2013  . Acid reflux 08/02/2013  . Human immunodeficiency virus (HIV) infection ([place]) 08/02/2013  . Biceps tendonitis on right 07/30/2013  . Rotator cuff syndrome of left shoulder 07/30/2013    Past Surgical History:  Procedure Laterality Date  . CARPAL TUNNEL RELEASE Left   . HERNIA REPAIR  1997   Left Lower Abdomen- [place]  . HERNIA REPAIR  [id number]   [place]  . INGUINAL HERNIA REPAIR Left 1996   [place]  . INGUINAL HERNIA REPAIR Right 1993   [place]  . LAMINECTOMY    . OPEN ANTERIOR SHOULDER RECONSTRUCTION Right 08/2013   [facility]  . REPLACEMENT TOTAL KNEE Right   . REPLACEMENT TOTAL KNEE Left    [facility]  . UMBILICAL HERNIA REPAIR  1997   [place]    Prior to Admission medications   Medication Sig Start Date End Date Taking? Authorizing Provider  bictegravir-emtricitabine-tenofovir AF (BIKTARVY) 50-200-25 MG TABS tablet Take 1 tablet by mouth daily.  04/21/17  Yes [provider]  cyanocobalamin 1000 MCG tablet Take 1,000 mcg by mouth daily.   Yes [provider]  doxycycline (VIBRA-TABS) 100 MG tablet Take 100 mg by mouth 2 (two) times daily. 10/23/17  Yes [provider]  escitalopram (LEXAPRO) 5 MG tablet Take 5 mg by mouth daily. 10/20/17  Yes [provider]  folic acid (FOLVITE) 1 MG tablet  Take 1 tablet (1 mg total) by mouth daily. 09/23/17  Yes Wieting, Richard, MD  naltrexone (DEPADE) 50 MG tablet Take 50 mg by mouth daily. 10/02/17  Yes [provider]  thiamine 100 MG tablet Take 1 tablet (100 mg total) by mouth daily. 09/23/17  Yes Wieting, Richard, MD  tolnaftate (ANTIFUNGAL SPRAY POWDER) 1 % spray Apply 1 application topically 2 (two) times daily.   Yes [provider]  Multiple Vitamin (MULTIVITAMIN WITH MINERALS) TABS tablet Take 1 tablet by mouth daily. Patient not taking: Reported on 10/29/2017 09/23/17   Loletha Grayer, MD  nystatin (MYCOSTATIN/NYSTOP) powder Apply topically 2 (two) times daily. Patient not taking: Reported on 10/29/2017 09/22/17   Loletha Grayer, MD  traZODone (DESYREL) 50 MG tablet Take 1 tablet (50 mg total) by mouth at bedtime as needed for sleep. Patient not taking: Reported on 10/29/2017 09/22/17   Loletha Grayer, MD    Allergies No known drug allergies  Family History  Problem Relation Age of Onset  . Diabetes Mother   . CAD Father 30    Social History Social History   Tobacco Use  . Smoking status: Former Smoker    Last attempt to quit: 06/18/2013    Years since quitting: 4.3  . Smokeless tobacco: Never Used  Substance Use Topics  . Alcohol use: Not Currently    Comment: last time was august 6,19  . Drug use: No    Comment: Use to do cocaine 30 years ago    Review of Systems Constitutional: Positive for fever/chills Eyes: No visual changes. ENT: No sore throat. Cardiovascular: Denies chest pain. Respiratory: Denies shortness of breath.  Positive for cough Gastrointestinal: No abdominal pain.  No nausea, no vomiting.  No diarrhea.  No constipation. Genitourinary: Negative for dysuria. Musculoskeletal: Negative for neck pain.  Negative for back pain. Integumentary: Negative for rash. Neurological: Negative for headaches, focal weakness or numbness.   ____________________________________________   PHYSICAL EXAM:  VITAL SIGNS: ED Triage Vitals  Enc Vitals Group     BP 10/28/17 2305 (!) 152/64     Pulse Rate 10/28/17 2305 95     Resp 10/28/17 2305 18     Temp 10/28/17 2305 98.7 F (37.1 C)     Temp Source 10/28/17 2305 Oral     SpO2 10/28/17 2305 100 %     Weight 10/28/17 2306 88 kg (194 lb)     Height 10/28/17 2306 1.778 m (5\' 10" )     Head Circumference --      Peak Flow --       Pain Score 10/28/17 2305 0     Pain Loc --      Pain Edu? --      Excl. in [place]? --     Constitutional: Alert and oriented. Well appearing and in no acute distress. Eyes: Conjunctivae are normal. PERRL. EOMI. Head: Atraumatic. Mouth/Throat: Mucous membranes are moist. Oropharynx non-erythematous. Neck: No stridor.   Cardiovascular: Normal rate, regular rhythm. Good peripheral circulation. Grossly normal heart sounds. Respiratory: Normal respiratory effort.  No retractions. Lungs CTAB. Gastrointestinal: Soft and nontender. No distention.  Genitourinary: Tinea cruris Musculoskeletal: No lower extremity tenderness nor edema. No gross deformities of extremities. Neurologic:  Normal speech and language. No gross focal neurologic deficits are appreciated.  Skin:  Skin is warm, dry and intact. No rash noted. Psychiatric: Mood and affect are normal. Speech and behavior are normal.  ____________________________________________   LABS (all labs ordered are listed, but only abnormal results  are displayed)  Labs Reviewed  COMPREHENSIVE METABOLIC PANEL - Abnormal; Notable for the following components:      Result Value   Sodium 131 (*)    Glucose, Bld 123 (*)    Creatinine, Ser 1.65 (*)    Calcium 8.6 (*)    AST 59 (*)    ALT 50 (*)    GFR calc non Af Amer 41 (*)    GFR calc Af Amer 47 (*)    All other components within normal limits  CBC WITH DIFFERENTIAL/PLATELET - Abnormal; Notable for the following components:   RBC 3.47 (*)    HCT 36.7 (*)    MCV 105.9 (*)    MCH 38.0 (*)    Lymphs Abs 0.6 (*)    All other components within normal limits  URINALYSIS, COMPLETE (UACMP) WITH MICROSCOPIC - Abnormal; Notable for the following components:   Color, Urine YELLOW (*)    APPearance CLEAR (*)    Glucose, UA >=500 (*)    Protein, ur 30 (*)    All other components within normal limits  CULTURE, BLOOD (ROUTINE X 2)  CULTURE, BLOOD (ROUTINE X 2)  LACTIC ACID, PLASMA  LACTATE  DEHYDROGENASE  LACTIC ACID, PLASMA   ____________________________________________  EKG  ED ECG REPORT I, Ramah N BROWN, the attending physician, personally viewed and interpreted this ECG.   Date: 10/29/2017  EKG Time: 11:39 PM  Rate: 88  Rhythm: Normal sinus rhythm  Axis: Normal  Intervals: Normal  ST&T Change: None  ____________________________________________  RADIOLOGY I, [place] N BROWN, personally viewed and evaluated these images (plain radiographs) as part of my medical decision making, as well as reviewing the written report by the radiologist.  ED MD interpretation: No acute findings noted CT abdomen pelvis per radiologist.  Official radiology report(s): Ct Abdomen Pelvis W Contrast  Result Date: 10/29/2017 CLINICAL DATA:  72 year old male with history of inguinal hernia repair and left inguinal wound drainage. Evaluate for abscess. Difficulty urinating. EXAM: CT ABDOMEN AND PELVIS WITH CONTRAST TECHNIQUE: Multidetector CT imaging of the abdomen and pelvis was performed using the standard protocol following bolus administration of intravenous contrast. CONTRAST:  24mL ISOVUE-300 IOPAMIDOL (ISOVUE-300) INJECTION 61% COMPARISON:  Renal ultrasound dated 09/18/2017 FINDINGS: Lower chest: The visualized lung bases are clear. No intra-abdominal free air for free fluid. Hepatobiliary: Apparent minimal irregularity of the contour of the left lobe of the liver which may represent early changes of cirrhosis. Clinical correlation is recommended. No intrahepatic biliary duct dilatation. Layering small stones or sludge in the gallbladder. No pericholecystic fluid or evidence of acute cholecystitis by CT. Pancreas: Unremarkable. No pancreatic ductal dilatation or surrounding inflammatory changes. Spleen: Normal in size without focal abnormality. Adrenals/Urinary Tract: The adrenal glands are unremarkable. Subcentimeter bilateral renal hypodense lesions are too small to characterize. There  is no hydronephrosis on either side. There is symmetric enhancement and excretion of contrast by both kidneys. The visualized ureters and urinary bladder appear unremarkable. Stomach/Bowel: There is sigmoid diverticulosis without active inflammatory changes. No bowel obstruction or active inflammation. Normal appendix. Vascular/Lymphatic: Moderate aortoiliac atherosclerotic disease. The origins of the celiac axis, SMA, IMA and the renal arteries are patent. The SMV, splenic vein, and main portal vein are patent. No portal venous gas. There is no adenopathy. Reproductive: The prostate and seminal vesicles are grossly unremarkable. No pelvic mass. Other: Small fat containing right inguinal hernia. Ventral hernia repair mesh noted. No fluid collection identified. Musculoskeletal: Total right hip arthroplasty appears intact. There is osteopenia. No acute  osseous pathology. IMPRESSION: 1. No acute intra-abdominopelvic pathology. Ventral hernia repair with mesh. No fluid collection or abscess. 2. Sigmoid diverticulosis. No bowel obstruction or active inflammation. Normal appendix. 3. Cholelithiasis. 4.  Aortic Atherosclerosis (ICD10-I70.0). Electronically Signed   By: Anner Crete M.D.   On: 10/29/2017 05:19   Dg Chest Port 1 View  Result Date: 10/28/2017 CLINICAL DATA:  Cough, fever EXAM: PORTABLE CHEST 1 VIEW COMPARISON:  09/22/2017 FINDINGS: Heart is normal size. No confluent airspace opacities or effusions. No acute bony abnormality. IMPRESSION: No active disease. Electronically Signed   By: Rolm Baptise M.D.   On: 10/28/2017 23:48    Procedures   ____________________________________________   INITIAL IMPRESSION / ASSESSMENT AND PLAN / ED COURSE  As part of my medical decision making, I reviewed the following data within the electronic MEDICAL RECORD NUMBER   72 year old male presenting with multiple medical complaints including cough subjective fever urinary retention and draining wound from a  previous hernia repair.  Concern for possible sepsis and as such protocol was initiated however laboratory data unremarkable including lactic acid.  Regarding the patient's cough chest x-ray revealed no evidence of pneumonia.  Regarding patient's abdominal discomfort and draining wound CT scan of the abdomen pelvis revealed no evidence of intra-abdominal or pelvic abscess or any other acute pathology.  Regarding the patient's urinary retention bladder scan on arrival to the emergency department revealed 43 mL's of urine. ____________________________________________  FINAL CLINICAL IMPRESSION(S) / ED DIAGNOSES  Final diagnoses:  Urinary retention  Bronchitis     MEDICATIONS GIVEN DURING THIS VISIT:  Medications  cefTRIAXone (ROCEPHIN) 1 g in sodium chloride 0.9 % 100 mL IVPB (0 g Intravenous Stopped 10/29/17 0101)  sodium chloride 0.9 % bolus 1,000 mL (0 mLs Intravenous Stopped 10/29/17 0610)  sodium chloride 0.9 % bolus 1,000 mL (0 mLs Intravenous Stopped 10/29/17 0600)  iopamidol (ISOVUE-300) 61 % injection 100 mL (75 mLs Intravenous Contrast Given 10/29/17 0351)     ED Discharge Orders    None       Note:  This document was prepared using Dragon voice recognition software and may include unintentional dictation errors.    Gregor Hams, MD 10/29/17 1594    Gregor Hams, MD 10/29/17 [phone]

## 2017-10-28 NOTE — ED Triage Notes (Signed)
Pt stated that he is having a hard time with urination and having a hard time going as well. Pt stated that he has a groin infection and was placed on ABX recently.

## 2017-10-29 ENCOUNTER — Emergency Department: Payer: Medicare Other

## 2017-10-29 DIAGNOSIS — R339 Retention of urine, unspecified: Secondary | ICD-10-CM | POA: Diagnosis not present

## 2017-10-29 LAB — URINALYSIS, COMPLETE (UACMP) WITH MICROSCOPIC
Bacteria, UA: NONE SEEN
Bilirubin Urine: NEGATIVE
Glucose, UA: >=500 mg/dL — AB
Hgb urine dipstick: NEGATIVE
Ketones, ur: NEGATIVE mg/dL
Leukocytes, UA: NEGATIVE
Nitrite: NEGATIVE
Protein, ur: 30 mg/dL — AB
Specific Gravity, Urine: 1.013 (ref 1.005–1.030)
Squamous Epithelial / LPF: NONE SEEN (ref 0–5)
pH: 7 (ref 5.0–8.0)

## 2017-10-29 LAB — LACTATE DEHYDROGENASE: LDH: 147 U/L (ref 98–192)

## 2017-10-29 MED ORDER — IOPAMIDOL (ISOVUE-300) INJECTION 61%
100.0000 mL | Freq: Once | INTRAVENOUS | Status: AC | PRN
  Administered 2017-10-29: 75 mL via INTRAVENOUS

## 2017-10-29 MED ORDER — SODIUM CHLORIDE 0.9 % IV BOLUS
1000.0000 mL | Freq: Once | INTRAVENOUS | Status: AC
  Administered 2017-10-29: 1000 mL via INTRAVENOUS

## 2017-10-29 MED ORDER — SODIUM CHLORIDE 0.9 % IV SOLN
1.0000 g | Freq: Once | INTRAVENOUS | Status: AC
  Administered 2017-10-29: 1 g via INTRAVENOUS
  Filled 2017-10-29: qty 10

## 2017-10-29 NOTE — ED Notes (Signed)
Assisted pt with urinal. Pt given warm blanket and is in bed resting comfortably. No other needs voiced at this time.

## 2017-10-29 NOTE — Progress Notes (Signed)
CODE SEPSIS - PHARMACY COMMUNICATION  **Broad Spectrum Antibiotics should be administered within 1 hour of Sepsis diagnosis**  Time Code Sepsis Called/Page Received: @ 1117  Antibiotics Ordered: Ceftriaxone  Time of 1st antibiotic administration: @ 0017  Additional action taken by pharmacy: Called nurse @ 1146 about time left for Abx administration   If necessary, Name of Provider/Nurse Contacted: Signa Kell, PharmD, BCPS Clinical Pharmacist 10/29/2017 12:25 AM

## 2017-11-02 LAB — CULTURE, BLOOD (ROUTINE X 2)
Culture: NO GROWTH
Culture: NO GROWTH
Special Requests: ADEQUATE

## 2017-11-18 ENCOUNTER — Other Ambulatory Visit: Payer: Self-pay | Admitting: Family Medicine

## 2017-11-18 DIAGNOSIS — M545 Low back pain: Secondary | ICD-10-CM

## 2017-12-05 ENCOUNTER — Ambulatory Visit: Payer: Medicare Other

## 2017-12-18 ENCOUNTER — Emergency Department: Payer: Medicare Other

## 2017-12-18 ENCOUNTER — Ambulatory Visit
Admission: RE | Admit: 2017-12-18 | Discharge: 2017-12-18 | Disposition: A | Discharge status: Home / Self Care | Payer: Medicare Other | Source: Ambulatory Visit | Attending: Physician Assistant | Admitting: Physician Assistant

## 2017-12-18 ENCOUNTER — Emergency Department
Admission: EM | Admit: 2017-12-18 | Discharge: 2017-12-18 | Disposition: A | Discharge status: Home / Self Care | Payer: Medicare Other | Attending: Emergency Medicine | Admitting: Emergency Medicine

## 2017-12-18 ENCOUNTER — Other Ambulatory Visit: Payer: Self-pay | Admitting: Physician Assistant

## 2017-12-18 ENCOUNTER — Encounter: Payer: Self-pay | Admitting: Emergency Medicine

## 2017-12-18 DIAGNOSIS — W01198A Fall on same level from slipping, tripping and stumbling with subsequent striking against other object, initial encounter: Secondary | ICD-10-CM | POA: Diagnosis not present

## 2017-12-18 DIAGNOSIS — M25461 Effusion, right knee: Secondary | ICD-10-CM | POA: Diagnosis not present

## 2017-12-18 DIAGNOSIS — F329 Major depressive disorder, single episode, unspecified: Secondary | ICD-10-CM | POA: Insufficient documentation

## 2017-12-18 DIAGNOSIS — Z96653 Presence of artificial knee joint, bilateral: Secondary | ICD-10-CM | POA: Insufficient documentation

## 2017-12-18 DIAGNOSIS — Z79899 Other long term (current) drug therapy: Secondary | ICD-10-CM | POA: Insufficient documentation

## 2017-12-18 DIAGNOSIS — W19XXXA Unspecified fall, initial encounter: Secondary | ICD-10-CM

## 2017-12-18 DIAGNOSIS — Y998 Other external cause status: Secondary | ICD-10-CM | POA: Diagnosis not present

## 2017-12-18 DIAGNOSIS — Z23 Encounter for immunization: Secondary | ICD-10-CM | POA: Diagnosis not present

## 2017-12-18 DIAGNOSIS — F419 Anxiety disorder, unspecified: Secondary | ICD-10-CM | POA: Diagnosis not present

## 2017-12-18 DIAGNOSIS — M25561 Pain in right knee: Secondary | ICD-10-CM

## 2017-12-18 DIAGNOSIS — Y9389 Activity, other specified: Secondary | ICD-10-CM | POA: Diagnosis not present

## 2017-12-18 DIAGNOSIS — M25532 Pain in left wrist: Secondary | ICD-10-CM

## 2017-12-18 DIAGNOSIS — Z87891 Personal history of nicotine dependence: Secondary | ICD-10-CM | POA: Diagnosis not present

## 2017-12-18 DIAGNOSIS — S51812A Laceration without foreign body of left forearm, initial encounter: Secondary | ICD-10-CM | POA: Diagnosis not present

## 2017-12-18 DIAGNOSIS — Y929 Unspecified place or not applicable: Secondary | ICD-10-CM | POA: Insufficient documentation

## 2017-12-18 DIAGNOSIS — Z21 Asymptomatic human immunodeficiency virus [HIV] infection status: Secondary | ICD-10-CM | POA: Insufficient documentation

## 2017-12-18 DIAGNOSIS — S59912A Unspecified injury of left forearm, initial encounter: Secondary | ICD-10-CM | POA: Diagnosis present

## 2017-12-18 MED ORDER — DICLOFENAC SODIUM 1 % TD GEL: 2.0000 g | Freq: Four times a day (QID) | TRANSDERMAL | 0 refills | Status: DC

## 2017-12-18 MED ORDER — CLINDAMYCIN HCL 300 MG PO CAPS: 300.0000 mg | ORAL_CAPSULE | Freq: Three times a day (TID) | ORAL | 0 refills | Status: AC

## 2017-12-18 MED ORDER — BACITRACIN-NEOMYCIN-POLYMYXIN 400-5-5000 EX OINT
TOPICAL_OINTMENT | Freq: Every day | CUTANEOUS | Status: DC
  Administered 2017-12-18: 1 via TOPICAL
  Filled 2017-12-18: qty 1

## 2017-12-18 MED ORDER — TETANUS-DIPHTH-ACELL PERTUSSIS 5-2.5-18.5 LF-MCG/0.5 IM SUSP
0.5000 mL | Freq: Once | INTRAMUSCULAR | Status: AC
  Administered 2017-12-18: 0.5 mL via INTRAMUSCULAR
  Filled 2017-12-18: qty 0.5

## 2017-12-18 MED ORDER — BACITRACIN-NEOMYCIN-POLYMYXIN 400-5-5000 EX OINT: 1.0000 "application " | TOPICAL_OINTMENT | Freq: Two times a day (BID) | CUTANEOUS | 0 refills | Status: DC

## 2017-12-18 NOTE — ED Provider Notes (Signed)
[facility] Emergency Department Provider Note  ____________________________________________  Time seen: Approximately 6:55 PM  I have reviewed the triage vital signs and the nursing notes.   HISTORY  Chief Complaint Knee Pain    HPI Anthony Olson. is a 72 y.o. male presents emergency department for evaluation of right hip, knee, left wrist pain after falling today.  Patient was trying to balance on a 2 x 4 while putting up a dog fence.  He lost his balance and fell on his right hip.  He caught his left arm on the 2 x 4.  He has had replacements in his right hip and knee and wanted to make sure that he did not disrupt the replacements.  He was able to get up and walk to the car.  He has a skin tear to his left forearm.  He is unsure of last tetanus shot.  He drove himself to the emergency department.  He did not hit his head or lose consciousness.  He denies any additional injuries or pain.   Past Medical History:  Diagnosis Date  . Hernia, abdominal   . HIV infection (HCC)    Sees Dr. Ola Spurr for this  . Inguinal hernia   . Kidney failure   . Neoplasm of skin     Patient Active Problem List   Diagnosis Date Noted  . Unilateral recurrent inguinal hernia without obstruction or gangrene 10/02/2017  . Syncope 09/18/2017  . 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 Thrombocytopenia ([place]) 07/19/2016  . Macrocytosis 03/20/2015  . Kidney disease 03/20/2015  . Peripheral vascular disease ([place]) 03/20/2015  . Hypertensive disorder 03/20/2015  . Peripheral neuritis 03/20/2015  . Seborrheic dermatitis 03/20/2015  . Right inguinal hernia 02/01/2015  . Right hip pain 02/01/2015  . H/O neoplasm 10/03/2014  . History of nonmelanoma skin cancer 10/03/2014  . 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 Spondylosis of lumbar region without myelopathy or radiculopathy 04/25/2014  . H/O total knee replacement 04/18/2014   . LBP (low back pain) 03/11/2014  . Acute confusion 02/17/2014  . Acute renal failure ([place]) 02/17/2014  . Arterial blood pressure decreased 02/17/2014  . Elevated WBC count 02/17/2014  . Hypotension 02/17/2014  . Anxiety and depression 02/15/2014  . Personal history of other infectious and parasitic diseases 02/15/2014  . 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  . Arthritis of knee, degenerative 12/13/2013  . Derangement of posterior horn of medial meniscus due to old injury, left 12/13/2013  . Primary osteoarthritis of left knee 12/13/2013  . Subacromial impingement of right shoulder 08/19/2013  . Acid reflux 08/02/2013  . Human immunodeficiency virus (HIV) infection ([place]) 08/02/2013  . Biceps tendonitis on right 07/30/2013  . Rotator cuff syndrome of left shoulder 07/30/2013    Past Surgical History:  Procedure Laterality Date  . CARPAL TUNNEL RELEASE Left   . HERNIA REPAIR  1997   Left Lower Abdomen- [place]  . HERNIA REPAIR  [id number]   Anthony  . INGUINAL HERNIA REPAIR Left 1996   [place]  . INGUINAL HERNIA REPAIR Right 1993   [place]  . LAMINECTOMY    . OPEN ANTERIOR SHOULDER RECONSTRUCTION Right 08/2013   [facility]  . REPLACEMENT TOTAL KNEE Right   . REPLACEMENT TOTAL KNEE Left    [facility]  . UMBILICAL HERNIA REPAIR  1997   [place]    Prior to Admission medications   Medication Sig Start Date End Date Taking? Authorizing  Provider  bictegravir-emtricitabine-tenofovir AF (BIKTARVY) 50-200-25 MG TABS tablet Take 1 tablet by mouth daily.  04/21/17   [provider]  cyanocobalamin 1000 MCG tablet Take 1,000 mcg by mouth daily.    [provider]  diclofenac sodium (VOLTAREN) 1 % GEL Apply 2 g topically 4 (four) times daily. 12/18/17   Laban Emperor, PA-C  doxycycline (VIBRA-TABS) 100 MG tablet Take 100 mg by mouth 2 (two) times daily. 10/23/17   [provider]  escitalopram (LEXAPRO) 5 MG tablet Take 5 mg by mouth  daily. 10/20/17   [provider]  folic acid (FOLVITE) 1 MG tablet Take 1 tablet (1 mg total) by mouth daily. 09/23/17   Loletha Grayer, MD  Multiple Vitamin (MULTIVITAMIN WITH MINERALS) TABS tablet Take 1 tablet by mouth daily. Patient not taking: Reported on 10/29/2017 09/23/17   Loletha Grayer, MD  naltrexone (DEPADE) 50 MG tablet Take 50 mg by mouth daily. 10/02/17   [provider]  neomycin-bacitracin-polymyxin (NEOSPORIN) ointment Apply 1 application topically every 12 (twelve) hours. 12/18/17   Laban Emperor, PA-C  nystatin (MYCOSTATIN/NYSTOP) powder Apply topically 2 (two) times daily. Patient not taking: Reported on 10/29/2017 09/22/17   Loletha Grayer, MD  thiamine 100 MG tablet Take 1 tablet (100 mg total) by mouth daily. 09/23/17   Loletha Grayer, MD  tolnaftate (ANTIFUNGAL SPRAY POWDER) 1 % spray Apply 1 application topically 2 (two) times daily.    [provider]  traZODone (DESYREL) 50 MG tablet Take 1 tablet (50 mg total) by mouth at bedtime as needed for sleep. Patient not taking: Reported on 10/29/2017 09/22/17   Loletha Grayer, MD    Allergies Patient has no known allergies.  Family History  Problem Relation Age of Onset  . Diabetes Mother   . CAD Father 27    Social History Social History   Tobacco Use  . Smoking status: Former Smoker    Last attempt to quit: 06/18/2013    Years since quitting: 4.5  . Smokeless tobacco: Never Used  Substance Use Topics  . Alcohol use: Not Currently    Comment: last time was august 6,19  . Drug use: No    Comment: Use to do cocaine 30 years ago     Review of Systems  Cardiovascular: No chest pain. Respiratory: No SOB. Gastrointestinal: No abdominal pain.  No nausea, no vomiting.  Musculoskeletal: Positive for wrist, hip, and knee pain.  Skin: Negative for ecchymosis. Positive for abrasion.  Neurological: Negative for headaches, numbness or  tingling   ____________________________________________   PHYSICAL EXAM:  VITAL SIGNS: ED Triage Vitals  Enc Vitals Group     BP 12/18/17 1604 115/70     Pulse Rate 12/18/17 1604 77     Resp 12/18/17 1604 16     Temp 12/18/17 1604 97.9 F (36.6 C)     Temp Source 12/18/17 1604 Oral     SpO2 12/18/17 1604 97 %     Weight 12/18/17 1605 186 lb (84.4 kg)     Height 12/18/17 1605 5\' 10"  (1.778 m)     Head Circumference --      Peak Flow --      Pain Score 12/18/17 1604 10     Pain Loc --      Pain Edu? --      Excl. in [place]? --      Constitutional: Alert and oriented. Well appearing and in no acute distress. Eyes: Conjunctivae are normal. PERRL. EOMI. Head: Atraumatic. ENT:  Ears:      Nose: No congestion/rhinnorhea.      Mouth/Throat: Mucous membranes are moist.  Neck: No stridor.  No cervical spine tenderness to palpation. Cardiovascular: Normal rate, regular rhythm.  Good peripheral circulation. Respiratory: Normal respiratory effort without tachypnea or retractions. Lungs CTAB. Good air entry to the bases with no decreased or absent breath sounds. Gastrointestinal: Bowel sounds 4 quadrants. Soft and nontender to palpation. No guarding or rigidity. No palpable masses. No distention.  Musculoskeletal: Full range of motion to all extremities. No gross deformities appreciated.  Full range of motion of wrist, hip, and knee. Neurologic:  Normal speech and language. No gross focal neurologic deficits are appreciated.  Skin:  Skin is warm, dry and intact.  Skin tear to left forearm. Psychiatric: Mood and affect are normal. Speech and behavior are normal. Patient exhibits appropriate insight and judgement.   ____________________________________________   LABS (all labs ordered are listed, but only abnormal results are displayed)  Labs Reviewed - No data to  display ____________________________________________  EKG   ____________________________________________  RADIOLOGY Robinette Haines, personally viewed and evaluated these images (plain radiographs) as part of my medical decision making, as well as reviewing the written report by the radiologist.  Dg Wrist Complete Left  Result Date: 12/18/2017 CLINICAL DATA:  Fall with wrist pain EXAM: LEFT WRIST - COMPLETE 3+ VIEW COMPARISON:  12/18/2017 FINDINGS: No acute displaced fracture or malalignment. No radiopaque foreign body in the soft tissues. IMPRESSION: Negative. Electronically Signed   By: Donavan Foil M.D.   On: 12/18/2017 18:00   Dg Wrist Complete Left  Result Date: 12/18/2017 CLINICAL DATA:  Chronic pain.  Digging holes.  Previous fracture. EXAM: LEFT WRIST - COMPLETE 3+ VIEW COMPARISON:  None. FINDINGS: There is no evidence of fracture or dislocation. There is no evidence of erosive arthropathy or other focal bone abnormality. There may be mild radiocarpal joint space narrowing. Soft tissues are unremarkable. IMPRESSION: No acute findings.  Question mild radiocarpal joint space narrowing. Electronically Signed   By: Staci Righter M.D.   On: 12/18/2017 16:40   Dg Knee Complete 4 Views Right  Result Date: 12/18/2017 CLINICAL DATA:  Mechanical fall.  Pain. EXAM: RIGHT KNEE - COMPLETE 4+ VIEW COMPARISON:  None. FINDINGS: The patient is status post total knee replacement. Satisfactory position and alignment of the femoral and tibial components. No periprosthetic fracture. No visible loosening. There may be a small suprapatellar effusion. Patella appears intact. No foreign body is seen. Skeletal osteopenia. IMPRESSION: Status post TKA with no visible fracture or dislocation. Cannot exclude small effusion. Osteopenia. Electronically Signed   By: Staci Righter M.D.   On: 12/18/2017 16:39   Dg Hip Unilat W Or Wo Pelvis 2-3 Views Right  Result Date: 12/18/2017 CLINICAL DATA:  Mechanical fall.   Pain. EXAM: DG HIP (WITH OR WITHOUT PELVIS) 2-3V RIGHT COMPARISON:  None. FINDINGS: The patient is status post RIGHT total hip arthroplasty. Satisfactory acetabular and femoral components. No periprosthetic fracture or dislocation. Regional bones of the pelvis appear intact. LEFT hip DJD. Lumbar spondylosis. IMPRESSION: Status post RIGHT THA. No adverse features. No periprosthetic fracture or dislocation. Electronically Signed   By: Staci Righter M.D.   On: 12/18/2017 16:41    ____________________________________________    PROCEDURES  Procedure(s) performed:    Procedures    Medications  neomycin-bacitracin-polymyxin (NEOSPORIN) ointment (1 application Topical Given 12/18/17 1813)  Tdap (BOOSTRIX) injection 0.5 mL (0.5 mLs Intramuscular Given 12/18/17 1814)     ____________________________________________  INITIAL IMPRESSION / ASSESSMENT AND PLAN / ED COURSE  Pertinent labs & imaging results that were available during my care of the patient were reviewed by me and considered in my medical decision making (see chart for details).  Review of the [place] CSRS was performed in accordance of the [place] prior to dispensing any controlled drugs.   Patient presented to the emergency department for evaluation after fall.  Vital signs and exam are reassuring.  Knee x-ray consistent with possible small effusion.  Hip x-ray negative for acute abnormalities.  Wrist x-ray negative for acute abnormalities.  Patient also had an x-ray completed of left wrist this morning for chronic wrist pain.  Patient has a walker at home.  He got declined knee immobilizer and request to pick up a knee brace at the pharmacy.  Ace wrap was applied.  Skin tear was cleaned and dressed.  Tetanus shot was updated.  Patient will be discharged home with prescriptions for clindamycin Neosporin, knee brace. Patient is to follow up with primary care and orthopedics as directed. Patient is given ED precautions to return to the ED  for any worsening or new symptoms.     ____________________________________________  FINAL CLINICAL IMPRESSION(S) / ED DIAGNOSES  Final diagnoses:  Fall, initial encounter  Acute pain of right knee  Effusion of right knee  Skin tear of left forearm without complication, initial encounter      NEW MEDICATIONS STARTED DURING THIS VISIT:  ED Discharge Orders         Ordered    Knee brace     12/18/17 1824    neomycin-bacitracin-polymyxin (NEOSPORIN) ointment  Every 12 hours     12/18/17 1824    diclofenac sodium (VOLTAREN) 1 % GEL  4 times daily     12/18/17 1824              This chart was dictated using voice recognition software/Dragon. Despite best efforts to proofread, errors can occur which can change the meaning. Any change was purely unintentional.    Laban Emperor, PA-C 12/18/17 1908    Harvest Dark, MD 12/18/17 2032

## 2017-12-18 NOTE — ED Triage Notes (Signed)
Pt arrived from home with complaints of right knee and hip pain. Pt states he was standing on 2x4 attempting to balance himself while building a fence. Pt states he lost his balance and had mechanical fall that resulted in pain to right hip and right knee pain. Pt has had both replaced and was concerned about damage being caused.

## 2017-12-20 ENCOUNTER — Ambulatory Visit
Admission: RE | Admit: 2017-12-20 | Discharge: 2017-12-20 | Disposition: A | Discharge status: Home / Self Care | Payer: Medicare Other | Source: Ambulatory Visit | Attending: Family Medicine | Admitting: Family Medicine

## 2017-12-20 DIAGNOSIS — M545 Low back pain, unspecified: Secondary | ICD-10-CM

## 2017-12-20 DIAGNOSIS — M5126 Other intervertebral disc displacement, lumbar region: Secondary | ICD-10-CM | POA: Insufficient documentation

## 2017-12-20 DIAGNOSIS — M48061 Spinal stenosis, lumbar region without neurogenic claudication: Secondary | ICD-10-CM | POA: Insufficient documentation

## 2017-12-26 ENCOUNTER — Ambulatory Visit
Admission: RE | Admit: 2017-12-26 | Discharge: 2017-12-26 | Disposition: A | Discharge status: Home / Self Care | Payer: Medicare Other | Source: Ambulatory Visit | Attending: Registered Nurse | Admitting: Registered Nurse

## 2017-12-26 ENCOUNTER — Other Ambulatory Visit: Payer: Self-pay | Admitting: Registered Nurse

## 2017-12-26 DIAGNOSIS — R52 Pain, unspecified: Secondary | ICD-10-CM

## 2017-12-26 DIAGNOSIS — M19011 Primary osteoarthritis, right shoulder: Secondary | ICD-10-CM | POA: Insufficient documentation

## 2017-12-26 DIAGNOSIS — M25511 Pain in right shoulder: Secondary | ICD-10-CM | POA: Diagnosis not present

## 2018-09-01 ENCOUNTER — Other Ambulatory Visit: Payer: Self-pay

## 2018-09-02 ENCOUNTER — Inpatient Hospital Stay: Payer: Medicare Other | Attending: Internal Medicine

## 2018-09-02 ENCOUNTER — Other Ambulatory Visit: Payer: Self-pay

## 2018-09-02 ENCOUNTER — Other Ambulatory Visit: Payer: Self-pay | Admitting: *Deleted

## 2018-09-02 ENCOUNTER — Inpatient Hospital Stay (HOSPITAL_BASED_OUTPATIENT_CLINIC_OR_DEPARTMENT_OTHER): Payer: Medicare Other | Admitting: Internal Medicine

## 2018-09-02 DIAGNOSIS — D696 Thrombocytopenia, unspecified: Secondary | ICD-10-CM | POA: Diagnosis not present

## 2018-09-02 DIAGNOSIS — D472 Monoclonal gammopathy: Secondary | ICD-10-CM

## 2018-09-02 DIAGNOSIS — Z21 Asymptomatic human immunodeficiency virus [HIV] infection status: Secondary | ICD-10-CM | POA: Diagnosis not present

## 2018-09-02 DIAGNOSIS — N183 Chronic kidney disease, stage 3 (moderate): Secondary | ICD-10-CM | POA: Diagnosis not present

## 2018-09-02 DIAGNOSIS — F1020 Alcohol dependence, uncomplicated: Secondary | ICD-10-CM | POA: Insufficient documentation

## 2018-09-02 LAB — CBC WITH DIFFERENTIAL/PLATELET
Abs Immature Granulocytes: 0.05 10*3/uL (ref 0.00–0.07)
Basophils Absolute: 0 10*3/uL (ref 0.0–0.1)
Basophils Relative: 1 %
Eosinophils Absolute: 0.1 10*3/uL (ref 0.0–0.5)
Eosinophils Relative: 1 %
HCT: 40.3 % (ref 39.0–52.0)
Hemoglobin: 14.1 g/dL (ref 13.0–17.0)
Immature Granulocytes: 1 %
Lymphocytes Relative: 23 %
Lymphs Abs: 1.6 10*3/uL (ref 0.7–4.0)
MCH: 35.6 pg — ABNORMAL HIGH (ref 26.0–34.0)
MCHC: 35 g/dL (ref 30.0–36.0)
MCV: 101.8 fL — ABNORMAL HIGH (ref 80.0–100.0)
Monocytes Absolute: 0.8 10*3/uL (ref 0.1–1.0)
Monocytes Relative: 11 %
Neutro Abs: 4.2 10*3/uL (ref 1.7–7.7)
Neutrophils Relative %: 63 %
Platelets: 131 10*3/uL — ABNORMAL LOW (ref 150–400)
RBC: 3.96 MIL/uL — ABNORMAL LOW (ref 4.22–5.81)
RDW: 12.5 % (ref 11.5–15.5)
WBC: 6.6 10*3/uL (ref 4.0–10.5)
nRBC: 0 % (ref 0.0–0.2)

## 2018-09-02 LAB — COMPREHENSIVE METABOLIC PANEL
ALT: 17 U/L (ref 0–44)
AST: 25 U/L (ref 15–41)
Albumin: 4.1 g/dL (ref 3.5–5.0)
Alkaline Phosphatase: 104 U/L (ref 38–126)
Anion gap: 11 (ref 5–15)
BUN: 27 mg/dL — ABNORMAL HIGH (ref 8–23)
CO2: 20 mmol/L — ABNORMAL LOW (ref 22–32)
Calcium: 8.9 mg/dL (ref 8.9–10.3)
Chloride: 104 mmol/L (ref 98–111)
Creatinine, Ser: 2 mg/dL — ABNORMAL HIGH (ref 0.61–1.24)
GFR calc Af Amer: 38 mL/min — ABNORMAL LOW (ref >60)
GFR calc non Af Amer: 33 mL/min — ABNORMAL LOW (ref >60)
Glucose, Bld: 101 mg/dL — ABNORMAL HIGH (ref 70–99)
Potassium: 4 mmol/L (ref 3.5–5.1)
Sodium: 135 mmol/L (ref 135–145)
Total Bilirubin: 1 mg/dL (ref 0.3–1.2)
Total Protein: 7 g/dL (ref 6.5–8.1)

## 2018-09-02 NOTE — Progress Notes (Signed)
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  # FEB 2016- positive low level UPEP;April 2016- 24 h UPEP- Neg; SIEP/ K-L ratio=N; MAY 2018- NEG M protein; REPEAT June 2019-M PROTEIN- ABSENT  # HIV- undetectable [Dr.Fitzgerald]; Hx HepC [s/p treatment]; CKD [creat 1.7- 1.9]; Hx of Alcohol abuse.   INTERVAL HISTORY:  73 year old male patient with a history of low level UPEP/MGUS; chronic mild thrombocytopenia noted in February 2016- is here for follow-up.  Patient denies any swelling in the legs.  Denies any nausea vomiting.  Appetite is good.  No weight loss.  He continues to drink at least 6 beers a day.  States his HIV is under good control.  He admits to compliance with his HIV medication.  Review of Systems  Constitutional: Negative for chills, diaphoresis, fever, malaise/fatigue and weight loss.  HENT: Negative for nosebleeds and sore throat.   Eyes: Negative for double vision.  Respiratory: Negative for cough, hemoptysis, sputum production, shortness of breath and wheezing.   Cardiovascular: Negative for chest pain, palpitations, orthopnea and leg swelling.  Gastrointestinal: Negative for abdominal pain, blood in stool, constipation, diarrhea, heartburn, melena, nausea and vomiting.  Genitourinary: Negative for dysuria, frequency and urgency.  Musculoskeletal: Positive for back pain and joint pain.  Skin: Negative.  Negative for itching and rash.  Neurological: Negative for dizziness, tingling, focal weakness, weakness and headaches.  Endo/Heme/Allergies: Does not bruise/bleed easily.  Psychiatric/Behavioral: Negative for depression. The patient is not nervous/anxious and does not have insomnia.      PAST MEDICAL HISTORY :  Past Medical History:   Diagnosis Date  . Hernia, abdominal   . HIV infection (HCC)    Sees Dr. Ola Spurr for this  . Inguinal hernia   . Kidney failure   . Neoplasm of skin     PAST SURGICAL HISTORY :   Past Surgical History:  Procedure Laterality Date  . CARPAL TUNNEL RELEASE Left   . HERNIA REPAIR  1997   Left Lower Abdomen- [place]  . HERNIA REPAIR  [id number]   [place]  . INGUINAL HERNIA REPAIR Left 1996   [place]  . INGUINAL HERNIA REPAIR Right 1993   [place]  . LAMINECTOMY    . OPEN ANTERIOR SHOULDER RECONSTRUCTION Right 08/2013   [facility]  . REPLACEMENT TOTAL KNEE Right   . REPLACEMENT TOTAL KNEE Left    [facility]  . UMBILICAL HERNIA REPAIR  1997   [place]    FAMILY HISTORY :   Family History  Problem Relation Age of Onset  . Diabetes Mother   . CAD Father 74    SOCIAL HISTORY:   Social History   Tobacco Use  . Smoking status: Former Smoker    Quit date: 06/18/2013    Years since quitting: 5.2  . Smokeless tobacco: Never Used  Substance Use Topics  . Alcohol use: Not Currently    Comment: last time was august 6,19  . Drug use: No    Comment: Use to do cocaine 30 years ago    ALLERGIES:  has No Known Allergies.  MEDICATIONS:  Current Outpatient Medications  Medication Sig Dispense Refill  . atorvastatin (LIPITOR) 20 MG tablet Take 1 tablet by mouth daily.    . bictegravir-emtricitabine-tenofovir AF (BIKTARVY) 50-200-25 MG TABS tablet Take 1 tablet  by mouth daily.     . cyanocobalamin 1000 MCG tablet Take 1,000 mcg by mouth daily.    [place] Kitchen escitalopram (LEXAPRO) 5 MG tablet Take 5 mg by mouth daily.    [place] Kitchen lisinopril (ZESTRIL) 2.5 MG tablet Take 1 tablet by mouth daily.    [place] Kitchen neomycin-bacitracin-polymyxin (NEOSPORIN) ointment Apply 1 application topically every 12 (twelve) hours. 15 g 0  . Omega-3 1000 MG CAPS Take 1 capsule by mouth daily.    . traZODone (DESYREL) 50 MG tablet Take 1 tablet (50 mg total) by mouth at bedtime as needed for sleep. 30 tablet  0  . diclofenac sodium (VOLTAREN) 1 % GEL Apply 2 g topically 4 (four) times daily. (Patient not taking: Reported on 09/02/2018) 100 g 0  . doxycycline (VIBRA-TABS) 100 MG tablet Take 100 mg by mouth 2 (two) times daily.  0  . folic acid (FOLVITE) 1 MG tablet Take 1 tablet (1 mg total) by mouth daily. (Patient not taking: Reported on 09/02/2018) 30 tablet 0  . Multiple Vitamin (MULTIVITAMIN WITH MINERALS) TABS tablet Take 1 tablet by mouth daily. (Patient not taking: Reported on 10/29/2017) 30 tablet 0  . naltrexone (DEPADE) 50 MG tablet Take 50 mg by mouth daily.    [place] Kitchen nystatin (MYCOSTATIN/NYSTOP) powder Apply topically 2 (two) times daily. (Patient not taking: Reported on 10/29/2017) 60 g 0  . thiamine 100 MG tablet Take 1 tablet (100 mg total) by mouth daily. (Patient not taking: Reported on 09/02/2018) 30 tablet 0  . tolnaftate (ANTIFUNGAL SPRAY POWDER) 1 % spray Apply 1 application topically 2 (two) times daily.     No current facility-administered medications for this visit.     PHYSICAL EXAMINATION:   BP 124/76   Pulse 65   Temp 99.3 F (37.4 C)   Wt 196 lb 4.8 oz (89 kg)   BMI 28.17 kg/m   Filed Weights   09/02/18 1418  Weight: 196 lb 4.8 oz (89 kg)    Physical Exam  Constitutional: He is oriented to person, place, and time and well-developed, well-nourished, and in no distress.  HENT:  Head: Normocephalic and atraumatic.  Mouth/Throat: Oropharynx is clear and moist. No oropharyngeal exudate.  Eyes: Pupils are equal, round, and reactive to light.  Neck: Normal range of motion. Neck supple.  Cardiovascular: Normal rate and regular rhythm.  Pulmonary/Chest: No respiratory distress. He has no wheezes.  Abdominal: Soft. Bowel sounds are normal. He exhibits no distension and no mass. There is no abdominal tenderness. There is no rebound and no guarding.  Musculoskeletal: Normal range of motion.        General: No tenderness or edema.  Neurological: He is alert and oriented to  person, place, and time.  Skin: Skin is warm.  Psychiatric: Affect normal.    LABORATORY DATA:  I have reviewed the data as listed    Component Value Date/Time   NA 135 09/02/2018 1318   K 4.0 09/02/2018 1318   CL 104 09/02/2018 1318   CO2 20 (L) 09/02/2018 1318   GLUCOSE 101 (H) 09/02/2018 1318   BUN 27 (H) 09/02/2018 1318   CREATININE 2.00 (H) 09/02/2018 1318   CREATININE 1.91 (H) 05/30/2014 1001   CALCIUM 8.9 09/02/2018 1318   PROT 7.0 09/02/2018 1318   PROT 6.9 05/30/2014 1001   ALBUMIN 4.1 09/02/2018 1318   ALBUMIN 4.1 05/30/2014 1001   AST 25 09/02/2018 1318   AST 18 05/30/2014 1001   ALT 17 09/02/2018 1318   ALT 10 (L)  05/30/2014 1001   ALKPHOS 104 09/02/2018 1318   ALKPHOS 171 (H) 05/30/2014 1001   BILITOT 1.0 09/02/2018 1318   BILITOT 2.1 (H) 06/13/2014 1407   GFRNONAA 33 (L) 09/02/2018 1318   GFRNONAA 36 (L) 05/30/2014 1001   GFRAA 38 (L) 09/02/2018 1318   GFRAA 41 (L) 05/30/2014 1001    No results found for: SPEP, UPEP  Lab Results  Component Value Date   WBC 6.6 09/02/2018   NEUTROABS 4.2 09/02/2018   HGB 14.1 09/02/2018   HCT 40.3 09/02/2018   MCV 101.8 (H) 09/02/2018   PLT 131 (L) 09/02/2018      Chemistry      Component Value Date/Time   NA 135 09/02/2018 1318   K 4.0 09/02/2018 1318   CL 104 09/02/2018 1318   CO2 20 (L) 09/02/2018 1318   BUN 27 (H) 09/02/2018 1318   CREATININE 2.00 (H) 09/02/2018 1318   CREATININE 1.91 (H) 05/30/2014 1001      Component Value Date/Time   CALCIUM 8.9 09/02/2018 1318   ALKPHOS 104 09/02/2018 1318   ALKPHOS 171 (H) 05/30/2014 1001   AST 25 09/02/2018 1318   AST 18 05/30/2014 1001   ALT 17 09/02/2018 1318   ALT 10 (L) 05/30/2014 1001   BILITOT 1.0 09/02/2018 1318   BILITOT 2.1 (H) 06/13/2014 1407        ASSESSMENT & PLAN:  Thrombocytopenia ([place]) # ThromboCytopenia platelets -intermittent.  Today platelets are 130s Question related to alcoholism versus ITP.  Stable.  Asymptomatic.  Continue  monitoring.  # Chronic kidney disease-III-BL- 1.8 . [follows up with Dr.Chang; Nephrology; [facility]] ; however creat 2.0; stable.  Ok with tylenol/ no other NSAIDs.   # HIV/AIDs-stable. On Biktarvy.  # Alcoholism- currently on 6 beers/day;  denies hard liquor. Recommend abistinence of alcohol.   # DISPOSITION:  # follow-up in 1 year- MD- [facility] CMP- Dr.B        Cammie Sickle, MD 09/02/2018 2:39 PM

## 2018-09-02 NOTE — Assessment & Plan Note (Addendum)
#   ThromboCytopenia platelets -intermittent.  Today platelets are 130s Question related to alcoholism versus ITP.  Stable.  Asymptomatic.  Continue monitoring.  # Chronic kidney disease-III-BL- 1.8 . [follows up with Dr.Chang; Nephrology; [facility]] ; however creat 2.0; stable.  Ok with tylenol/ no other NSAIDs.   # HIV/AIDs-stable. On Biktarvy.  # Alcoholism- currently on 6 beers/day;  denies hard liquor. Recommend abistinence of alcohol.   # DISPOSITION:  # follow-up in 1 year- MD- /CBC CMP- Dr.B

## 2018-09-02 NOTE — Progress Notes (Signed)
Patient does not offer any problems today.  Reports that he quite drinking alcohol for about 10 months but has gone back to drinking a couple of beers a day.

## 2019-07-16 ENCOUNTER — Emergency Department
Admission: EM | Admit: 2019-07-16 | Discharge: 2019-07-17 | Disposition: A | Discharge status: Home / Self Care | Payer: Medicare Other | Attending: Emergency Medicine | Admitting: Emergency Medicine

## 2019-07-16 ENCOUNTER — Emergency Department: Payer: Medicare Other

## 2019-07-16 ENCOUNTER — Other Ambulatory Visit: Payer: Self-pay

## 2019-07-16 DIAGNOSIS — Y908 Blood alcohol level of 240 mg/100 ml or more: Secondary | ICD-10-CM | POA: Diagnosis not present

## 2019-07-16 DIAGNOSIS — I129 Hypertensive chronic kidney disease with stage 1 through stage 4 chronic kidney disease, or unspecified chronic kidney disease: Secondary | ICD-10-CM | POA: Diagnosis not present

## 2019-07-16 DIAGNOSIS — Z79899 Other long term (current) drug therapy: Secondary | ICD-10-CM | POA: Diagnosis not present

## 2019-07-16 DIAGNOSIS — Z21 Asymptomatic human immunodeficiency virus [HIV] infection status: Secondary | ICD-10-CM | POA: Insufficient documentation

## 2019-07-16 DIAGNOSIS — F10129 Alcohol abuse with intoxication, unspecified: Secondary | ICD-10-CM | POA: Diagnosis not present

## 2019-07-16 DIAGNOSIS — N189 Chronic kidney disease, unspecified: Secondary | ICD-10-CM | POA: Diagnosis not present

## 2019-07-16 DIAGNOSIS — R0789 Other chest pain: Secondary | ICD-10-CM

## 2019-07-16 DIAGNOSIS — R071 Chest pain on breathing: Secondary | ICD-10-CM | POA: Insufficient documentation

## 2019-07-16 DIAGNOSIS — R748 Abnormal levels of other serum enzymes: Secondary | ICD-10-CM

## 2019-07-16 DIAGNOSIS — F10929 Alcohol use, unspecified with intoxication, unspecified: Secondary | ICD-10-CM

## 2019-07-16 HISTORY — DX: Alcohol abuse, uncomplicated: F10.10

## 2019-07-16 LAB — CBC
HCT: 42.1 % (ref 39.0–52.0)
Hemoglobin: 15.1 g/dL (ref 13.0–17.0)
MCH: 37.7 pg — ABNORMAL HIGH (ref 26.0–34.0)
MCHC: 35.9 g/dL (ref 30.0–36.0)
MCV: 105 fL — ABNORMAL HIGH (ref 80.0–100.0)
Platelets: 141 10*3/uL — ABNORMAL LOW (ref 150–400)
RBC: 4.01 MIL/uL — ABNORMAL LOW (ref 4.22–5.81)
RDW: 12.3 % (ref 11.5–15.5)
WBC: 6.8 10*3/uL (ref 4.0–10.5)
nRBC: 0 % (ref 0.0–0.2)

## 2019-07-16 LAB — BASIC METABOLIC PANEL
Anion gap: 16 — ABNORMAL HIGH (ref 5–15)
BUN: 28 mg/dL — ABNORMAL HIGH (ref 8–23)
CO2: 21 mmol/L — ABNORMAL LOW (ref 22–32)
Calcium: 8.3 mg/dL — ABNORMAL LOW (ref 8.9–10.3)
Chloride: 101 mmol/L (ref 98–111)
Creatinine, Ser: 1.61 mg/dL — ABNORMAL HIGH (ref 0.61–1.24)
GFR calc Af Amer: 49 mL/min — ABNORMAL LOW (ref >60)
GFR calc non Af Amer: 42 mL/min — ABNORMAL LOW (ref >60)
Glucose, Bld: 75 mg/dL (ref 70–99)
Potassium: 4 mmol/L (ref 3.5–5.1)
Sodium: 138 mmol/L (ref 135–145)

## 2019-07-16 MED ORDER — SODIUM CHLORIDE 0.9% FLUSH: 3.0000 mL | Freq: Once | INTRAVENOUS | Status: DC

## 2019-07-16 NOTE — ED Provider Notes (Signed)
[facility] Emergency Department Provider Note  ____________________________________________   First MD Initiated Contact with Patient 07/16/19 2319     (approximate)  I have reviewed the triage vital signs and the nursing notes.   HISTORY  Chief Complaint Chest Pain  Level 5 caveat:  history/ROS may be limited by acute intoxication  HPI Anthony Olson. is a 74 y.o. male with medical history as listed below who also notes "I am an alcoholic"  who presents by EMS for evaluation of chest wall pain.  He says that he fell or passed out earlier "but it was not because of the alcohol" although he reports he has been drinking heavily today.  He said that after the fall he had acute onset severe pain in the right side of the front of his ribs and the pain is reproducible with palpation.  It hurts when he takes deep breaths as well.  He denies headache and denies hitting his head.  He has no neck pain, sore throat, shortness of breath (just the discomfort with deep breaths), nausea, vomiting, abdominal pain, or dysuria.  Nothing in particular makes his symptoms better other than not palpating or taking deep breaths.  He denies having seizures or hallucinations when he does not drink.        Past Medical History:  Diagnosis Date  . Alcohol abuse    self-reported  . Hernia, abdominal   . HIV infection (HCC)    Sees Dr. Ola Spurr for this  . Inguinal hernia   . Kidney failure   . Neoplasm of skin     Patient Active Problem List   Diagnosis Date Noted  . Unilateral recurrent inguinal hernia without obstruction or gangrene 10/02/2017  . Syncope 09/18/2017  . 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 Thrombocytopenia ([place]) 07/19/2016  . Macrocytosis 03/20/2015  . Kidney disease 03/20/2015  . Peripheral vascular disease ([place]) 03/20/2015  . Hypertensive disorder 03/20/2015  . Peripheral neuritis 03/20/2015  . Seborrheic dermatitis  03/20/2015  . Right inguinal hernia 02/01/2015  . Right hip pain 02/01/2015  . H/O neoplasm 10/03/2014  . History of nonmelanoma skin cancer 10/03/2014  . 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 Spondylosis of lumbar region without myelopathy or radiculopathy 04/25/2014  . 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 LBP (low back pain) 03/11/2014  . Acute confusion 02/17/2014  . Acute renal failure ([place]) 02/17/2014  . Arterial blood pressure decreased 02/17/2014  . Elevated WBC count 02/17/2014  . Hypotension 02/17/2014  . Anxiety and depression 02/15/2014  . Personal history of other infectious and parasitic diseases 02/15/2014  . 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  . Arthritis of knee, degenerative 12/13/2013  . Derangement of posterior horn of medial meniscus due to old injury, left 12/13/2013  . Primary osteoarthritis of left knee 12/13/2013  . Subacromial impingement of right shoulder 08/19/2013  . Acid reflux 08/02/2013  . Human immunodeficiency virus (HIV) infection ([place]) 08/02/2013  . Biceps tendonitis on right 07/30/2013  . Rotator cuff syndrome of left shoulder 07/30/2013    Past Surgical History:  Procedure Laterality Date  . CARPAL TUNNEL RELEASE Left   . HERNIA REPAIR  1997   Left Lower Abdomen- [place]  . HERNIA REPAIR  [id number]   [place]  . INGUINAL HERNIA REPAIR Left 1996   [place]  . INGUINAL HERNIA REPAIR Right 1993   [place]  . LAMINECTOMY    . OPEN ANTERIOR SHOULDER  RECONSTRUCTION Right 08/2013   [facility]  . REPLACEMENT TOTAL KNEE Right   . REPLACEMENT TOTAL KNEE Left    [facility]  . UMBILICAL HERNIA REPAIR  1997   [place]    Prior to Admission medications   Medication Sig Start Date End Date Taking? Authorizing Provider  bictegravir-emtricitabine-tenofovir AF (BIKTARVY) 50-200-25 MG TABS tablet Take 1 tablet by mouth daily.  04/21/17   [provider]  cyanocobalamin 1000 MCG tablet Take  1,000 mcg by mouth daily.    [provider]  diclofenac sodium (VOLTAREN) 1 % GEL Apply 2 g topically 4 (four) times daily. Patient not taking: Reported on 09/02/2018 12/18/17   Laban Emperor, PA-C  doxycycline (VIBRA-TABS) 100 MG tablet Take 100 mg by mouth 2 (two) times daily. 10/23/17   [provider]  escitalopram (LEXAPRO) 5 MG tablet Take 5 mg by mouth daily. 10/20/17   [provider]  folic acid (FOLVITE) 1 MG tablet Take 1 tablet (1 mg total) by mouth daily. Patient not taking: Reported on 09/02/2018 09/23/17   Loletha Grayer, MD  Multiple Vitamin (MULTIVITAMIN WITH MINERALS) TABS tablet Take 1 tablet by mouth daily. Patient not taking: Reported on 10/29/2017 09/23/17   Loletha Grayer, MD  naltrexone (DEPADE) 50 MG tablet Take 50 mg by mouth daily. 10/02/17   [provider]  neomycin-bacitracin-polymyxin (NEOSPORIN) ointment Apply 1 application topically every 12 (twelve) hours. 12/18/17   Laban Emperor, PA-C  nystatin (MYCOSTATIN/NYSTOP) powder Apply topically 2 (two) times daily. Patient not taking: Reported on 10/29/2017 09/22/17   Loletha Grayer, MD  Omega-3 1000 MG CAPS Take 1 capsule by mouth daily.    [provider]  thiamine 100 MG tablet Take 1 tablet (100 mg total) by mouth daily. Patient not taking: Reported on 09/02/2018 09/23/17   Loletha Grayer, MD  tolnaftate (ANTIFUNGAL SPRAY POWDER) 1 % spray Apply 1 application topically 2 (two) times daily.    [provider]  traZODone (DESYREL) 50 MG tablet Take 1 tablet (50 mg total) by mouth at bedtime as needed for sleep. 09/22/17   Loletha Grayer, MD    Allergies Patient has no known allergies.  Family History  Problem Relation Age of Onset  . Diabetes Mother   . CAD Father 36    Social History Social History   Tobacco Use  . Smoking status: Former Smoker    Quit date: 06/18/2013    Years since quitting: 6.0  . Smokeless tobacco: Never Used  Substance Use Topics   . Alcohol use: Not Currently    Comment: last time was august 6,19  . Drug use: No    Comment: Use to do cocaine 30 years ago    Review of Systems Level 5 caveat:  history/ROS may be limited by acute intoxication  Constitutional: No fever/chills Eyes: No visual changes. ENT: No sore throat. Cardiovascular: Right-sided anterior chest wall pain. Respiratory: Denies shortness of breath.  Pain with deep inspiration. Gastrointestinal: No abdominal pain.  No nausea, no vomiting.  No diarrhea.  No constipation. Genitourinary: Negative for dysuria. Musculoskeletal: Negative for neck pain.  Negative for back pain. Integumentary: Negative for rash. Neurological: Negative for headaches, focal weakness or numbness.   ____________________________________________   PHYSICAL EXAM:  ED Triage Vitals  Enc Vitals Group     BP 07/16/19 2322 117/77     Pulse Rate 07/16/19 2321 (!) 56     Resp 07/16/19 2321 14     Temp 07/16/19 2321 98.1 F (36.7 C)  Temp Source 07/16/19 2321 Oral     SpO2 07/16/19 2321 96 %     Weight 07/16/19 2320 88.9 kg (196 lb)     Height 07/16/19 2320 1.778 m (5\' 10" )     Head Circumference --      Peak Flow --      Pain Score 07/16/19 2320 5     Pain Loc --      Pain Edu? --      Excl. in [place]? --     Constitutional: Alert and oriented.  Appears acutely intoxicated. Eyes: Conjunctivae are normal.  Head: Atraumatic. Nose: No congestion/rhinnorhea. Mouth/Throat: Patient is wearing a mask. Neck: No stridor.  No meningeal signs.   Cardiovascular: Normal rate, regular rhythm. Good peripheral circulation. Grossly normal heart sounds. Respiratory: Normal respiratory effort.  No retractions. Gastrointestinal: Soft and nontender. No distention.  Musculoskeletal: Easily reproducible right-sided anterior chest wall pain.  No lateral tenderness to palpation.  No lower extremity tenderness nor edema. No gross deformities of extremities. Neurologic: Slightly slurred  speech and language. No gross focal neurologic deficits are appreciated.  Skin:  Skin is warm, dry and intact. Psychiatric: Mood and affect are normal. Speech and behavior are normal.  ____________________________________________   LABS (all labs ordered are listed, but only abnormal results are displayed)  Labs Reviewed  BASIC METABOLIC PANEL - Abnormal; Notable for the following components:      Result Value   CO2 21 (*)    BUN 28 (*)    Creatinine, Ser 1.61 (*)    Calcium 8.3 (*)    GFR calc non Af Amer 42 (*)    GFR calc Af Amer 49 (*)    Anion gap 16 (*)    All other components within normal limits  CBC - Abnormal; Notable for the following components:   RBC 4.01 (*)    MCV 105.0 (*)    MCH 37.7 (*)    Platelets 141 (*)    All other components within normal limits  HEPATIC FUNCTION PANEL - Abnormal; Notable for the following components:   AST 56 (*)    Bilirubin, Direct 0.3 (*)    All other components within normal limits  LIPASE, BLOOD - Abnormal; Notable for the following components:   Lipase 61 (*)    All other components within normal limits  ETHANOL - Abnormal; Notable for the following components:   Alcohol, Ethyl (B) 375 (*)    All other components within normal limits  TROPONIN I (HIGH SENSITIVITY)  TROPONIN I (HIGH SENSITIVITY)   ____________________________________________  EKG  ED ECG REPORT I, Hinda Kehr, the attending physician, personally viewed and interpreted this ECG.  Date: 07/16/2019 EKG Time: 23: 15 Rate: 54 Rhythm: Sinus bradycardia QRS Axis: normal Intervals: normal ST/T Wave abnormalities: normal Narrative Interpretation: no evidence of acute ischemia  ____________________________________________  RADIOLOGY I, Hinda Kehr, personally viewed and evaluated these images (plain radiographs) as part of my medical decision making, as well as reviewing the written report by the radiologist.  ED MD interpretation: Age-indeterminate  lateral ninth and 10th rib fractures and a chronic deformity of the left eighth rib on chest x-ray.  No acute abnormalities identified on chest CT.  Official radiology report(s): DG Chest 2 View  Result Date: 07/16/2019 CLINICAL DATA:  Right chest pain for 1 week with syncope EXAM: CHEST - 2 VIEW COMPARISON:  Radiograph 10/28/2017 FINDINGS: Age indeterminate lateral ninth and tenth rib right rib fractures. Deformity of the left eighth rib is similar  to prior. Streaky opacities bases likely favor atelectasis in the setting of pain and splinting. No consolidation, features of edema, pneumothorax, or effusion. The cardiomediastinal contours are unremarkable. Telemetry leads overlie the chest. Degenerative changes are present in the imaged spine and shoulders. Remote compression deformity of a midthoracic vertebrae is stable from prior. Prior right rotator cuff repair. IMPRESSION: 1. Age indeterminate lateral ninth and tenth rib right rib fractures. Correlate for point tenderness. 2. Deformity of the left eighth rib is similar to prior. 3. Streaky opacities likely favor atelectasis in the setting of pain and splinting. Electronically Signed   By: Lovena Le M.D.   On: 07/16/2019 23:44   CT Chest Wo Contrast  Result Date: 07/17/2019 CLINICAL DATA:  Right-sided chest pain, question of rib fracture EXAM: CT CHEST WITHOUT CONTRAST TECHNIQUE: Multidetector CT imaging of the chest was performed following the standard protocol without IV contrast. COMPARISON:  Chest radiograph September 22, 2017 FINDINGS: Cardiovascular: There is mild cardiomegaly. No pericardial effusion is seen. Coronary artery calcifications are seen. There is scattered aortic atherosclerosis. Mediastinum/Nodes: There are no enlarged mediastinal, hilar or axillary lymph nodes. The thyroid gland, trachea and esophagus demonstrate no significant findings. Lungs/Pleura: Mild bibasilar streaky ground-glass atelectasis seen. No large airspace consolidation  or pleural effusion. Upper abdomen:  A small hiatal hernia is present. Musculoskeletal/Chest wall: Healed lateral right ninth and tenth rib fractures are seen. No acute rib fractures are noted. There is also a chronic superior compression deformity of the T7 vertebral body with less than 25% loss in height which was seen on a radiograph dating back to 2019. IMPRESSION: No acute intrathoracic pathology or osseous abnormality. Healed right ninth and tenth rib fractures. Chronic superior compression deformity of the T7 vertebral body. Aortic Atherosclerosis (ICD10-I70.0). Electronically Signed   By: Prudencio Pair M.D.   On: 07/17/2019 01:10    ____________________________________________   PROCEDURES   Procedure(s) performed (including Critical Care):  .1-3 Lead EKG Interpretation Performed by: Hinda Kehr, MD Authorized by: Hinda Kehr, MD     Interpretation: abnormal     ECG rate:  56   ECG rate assessment: bradycardic     Rhythm: sinus bradycardia     Ectopy: none     Conduction: normal       ____________________________________________   INITIAL IMPRESSION / MDM / ASSESSMENT AND PLAN / ED COURSE  As part of my medical decision making, I reviewed the following data within the [place] notes reviewed and incorporated, Labs reviewed , EKG interpreted , Old chart reviewed, Radiograph reviewed , Notes from prior ED visits and [place] Controlled Substance Database   Differential diagnosis includes, but is not limited to, chest wall pain, rib contusion, rib fracture, other intrathoracic trauma, ACS, PE, pneumonia, pneumothorax, aspiration.  The patient's alcohol abuse and history of HIV complicate the matters somewhat, but he gives a good history that he did not have any pain until after his fall, and his pain is easily reproduced with palpation to the front right rib cage.  Plan includes labs, chest x-ray, continued monitoring.  Patient is clearly intoxicated  and I have ordered CIWA protocol and thiamine 100 mg IV.  He is comfortable with the current plan.  I anticipate a period of sobriety and observation with 2 troponins.  Very unlikely to represent pulmonary embolism.  No suggestion of AAS at this time.  The patient is on the cardiac monitor to evaluate for evidence of arrhythmia and/or significant heart rate changes.  Clinical Course as of Jul 16 153  Sat Jul 17, 2019  0010 Troponin I (High Sensitivity): 6 [CF]  [id number] Basic metabolic panel was generally reassuring.  The patient has chronic kidney disease and his creatinine of 1.6 is actually substantially better than it has been in the past.  Basic metabolic panel(!) [CF]  [id number] Most likely subacute rib fractures on the right side because the location of the rib fractures do not correlate with the tenderness to palpation on his anterior chest.  However given his history of alcohol abuse, poor historian, chest pain, and some age-indeterminate rib fractures, I think it would be appropriate to obtain a CT scan of his chest to look for any evidence of occult fractures not seen on the imaging and look for any other evidence of intrathoracic trauma.  Given his chronic kidney disease I will obtain the scan without contrast.  DG Chest 2 View [CF]  0151 The patient is intoxicated but he is ambulatory and coherent.  He is wanting to leave.  He has no evidence of emergent medical condition at this time.  1 could make an argument about his capacity but he is making sense, states that he needs to get home to his dog, he is ambulatory with no ataxia, and not requiring any assistance.  He admits to being a chronic alcoholic.  At this point I have to decide whether or not to take away his rights and put him under involuntary commitment or allow him to go home in a safe manner, and I think that the latter is the best method to proceed.  I discussed with him and the ED nurses what would be a safe plan, and we all agreed  upon having him take a taxi directly to his house.  Even though this is not ideal, I think it is the best solution and better than placing him under involuntary commitment without a clear indication to do so.  There is no evidence of emergent medical condition at this time and he will be discharged and taken directly home by taxi.  I gave him follow-up information.   [CF]  0155 Of note, no acute abnormalities on chest CT.  CT Chest Wo Contrast [CF]    Clinical Course User Index [CF] Hinda Kehr, MD     ____________________________________________  FINAL CLINICAL IMPRESSION(S) / ED DIAGNOSES  Final diagnoses:  Chest wall pain  Alcoholic intoxication with complication (HCC)  Elevated lipase  Chronic kidney disease, unspecified CKD stage     MEDICATIONS GIVEN DURING THIS VISIT:  Medications  thiamine (B-1) injection 100 mg (100 mg Intravenous Given 07/17/19 0058)     ED Discharge Orders    None      *Please note:  Anthony Must. was evaluated in Emergency Department on 07/17/2019 for the symptoms described in the history of present illness. He was evaluated in the context of the global COVID-19 pandemic, which necessitated consideration that the patient might be at risk for infection with the SARS-CoV-2 virus that causes COVID-19. Institutional protocols and algorithms that pertain to the evaluation of patients at risk for COVID-19 are in a state of rapid change based on information released by regulatory bodies including the CDC and federal and state organizations. These policies and algorithms were followed during the patient's care in the ED.  Some ED evaluations and interventions may be delayed as a result of limited staffing during the pandemic.*  Note:  This document was prepared using Dragon voice recognition  software and may include unintentional dictation errors.   Hinda Kehr, MD 07/17/19 [phone]

## 2019-07-16 NOTE — ED Triage Notes (Signed)
Pt with right sided chst pain for a week with syncope today. e

## 2019-07-17 ENCOUNTER — Emergency Department: Payer: Medicare Other

## 2019-07-17 ENCOUNTER — Encounter: Payer: Self-pay | Admitting: Emergency Medicine

## 2019-07-17 DIAGNOSIS — R0789 Other chest pain: Secondary | ICD-10-CM | POA: Diagnosis not present

## 2019-07-17 LAB — HEPATIC FUNCTION PANEL
ALT: 39 U/L (ref 0–44)
AST: 56 U/L — ABNORMAL HIGH (ref 15–41)
Albumin: 4 g/dL (ref 3.5–5.0)
Alkaline Phosphatase: 97 U/L (ref 38–126)
Bilirubin, Direct: 0.3 mg/dL — ABNORMAL HIGH (ref 0.0–0.2)
Indirect Bilirubin: 0.9 mg/dL (ref 0.3–0.9)
Total Bilirubin: 1.2 mg/dL (ref 0.3–1.2)
Total Protein: 7.1 g/dL (ref 6.5–8.1)

## 2019-07-17 LAB — LIPASE, BLOOD: Lipase: 61 U/L — ABNORMAL HIGH (ref 11–51)

## 2019-07-17 LAB — TROPONIN I (HIGH SENSITIVITY): Troponin I (High Sensitivity): 6 ng/L (ref <18)

## 2019-07-17 LAB — ETHANOL: Alcohol, Ethyl (B): 375 mg/dL (ref <10)

## 2019-07-17 MED ORDER — THIAMINE HCL 100 MG/ML IJ SOLN
100.0000 mg | INTRAMUSCULAR | Status: AC
  Administered 2019-07-17: 100 mg via INTRAVENOUS
  Filled 2019-07-17: qty 2

## 2019-07-17 NOTE — Discharge Instructions (Signed)
We believe you fell and passed out today because of your alcohol intoxication.  Please consider some of the outpatient resources for alcohol abuse.  Follow-up with your regular doctor at the next available opportunity.  Your medical work-up was generally reassuring today but you need to cut back on your drinking.

## 2019-07-17 NOTE — ED Notes (Signed)
Pt was found in the front lobby, pt stated, "I need to get home." LEFT AC IV WAS REMOVED FROM PATIENT. BUTCH,RN AWARED.

## 2019-07-17 NOTE — ED Notes (Signed)
Pt in lobby at this time waiting for a taxi cab ride home. Pt asked several times by myself, April (Charge RN), and Dr Karma Greaser to remain in the room until the cab arrives, but pt adamant about leaving and refuses to wait in his room. Pt refusing to allow a set of d/c vitals or to provide an electronic signature. Pt will be monitored in the lobby by Wells Guiles, RN and notify this RN when the pt is safely placed into a cab for post-d/c transportation.

## 2019-07-17 NOTE — ED Notes (Signed)
Pt getting in [place] taxicab at this time.

## 2019-07-17 NOTE — ED Notes (Signed)
Pt refusing 2nd Troponin lab draw.

## 2019-07-17 NOTE — ED Notes (Signed)
Pt up to desk at this time requesting to leave. Pt states he's "been here long enough" and he's "tired of being here". Pt instructed to return to his room and informed him that Dr Karma Greaser would be in to speak with him as soon as he is available to do so.

## 2019-09-01 ENCOUNTER — Encounter: Payer: Self-pay | Admitting: *Deleted

## 2019-09-02 ENCOUNTER — Encounter: Payer: Self-pay | Admitting: Internal Medicine

## 2019-09-02 ENCOUNTER — Inpatient Hospital Stay (HOSPITAL_BASED_OUTPATIENT_CLINIC_OR_DEPARTMENT_OTHER): Payer: Medicare Other | Admitting: Internal Medicine

## 2019-09-02 ENCOUNTER — Inpatient Hospital Stay: Payer: Medicare Other | Attending: Internal Medicine

## 2019-09-02 ENCOUNTER — Other Ambulatory Visit: Payer: Self-pay

## 2019-09-02 DIAGNOSIS — N183 Chronic kidney disease, stage 3 unspecified: Secondary | ICD-10-CM | POA: Diagnosis not present

## 2019-09-02 DIAGNOSIS — D472 Monoclonal gammopathy: Secondary | ICD-10-CM

## 2019-09-02 DIAGNOSIS — F1020 Alcohol dependence, uncomplicated: Secondary | ICD-10-CM | POA: Diagnosis not present

## 2019-09-02 DIAGNOSIS — D696 Thrombocytopenia, unspecified: Secondary | ICD-10-CM | POA: Diagnosis not present

## 2019-09-02 DIAGNOSIS — Z21 Asymptomatic human immunodeficiency virus [HIV] infection status: Secondary | ICD-10-CM | POA: Insufficient documentation

## 2019-09-02 LAB — CBC WITH DIFFERENTIAL/PLATELET
Abs Immature Granulocytes: 0.07 10*3/uL (ref 0.00–0.07)
Basophils Absolute: 0 10*3/uL (ref 0.0–0.1)
Basophils Relative: 0 %
Eosinophils Absolute: 0 10*3/uL (ref 0.0–0.5)
Eosinophils Relative: 1 %
HCT: 40.4 % (ref 39.0–52.0)
Hemoglobin: 14.4 g/dL (ref 13.0–17.0)
Immature Granulocytes: 1 %
Lymphocytes Relative: 23 %
Lymphs Abs: 1.6 10*3/uL (ref 0.7–4.0)
MCH: 38 pg — ABNORMAL HIGH (ref 26.0–34.0)
MCHC: 35.6 g/dL (ref 30.0–36.0)
MCV: 106.6 fL — ABNORMAL HIGH (ref 80.0–100.0)
Monocytes Absolute: 0.7 10*3/uL (ref 0.1–1.0)
Monocytes Relative: 10 %
Neutro Abs: 4.7 10*3/uL (ref 1.7–7.7)
Neutrophils Relative %: 65 %
Platelets: 105 10*3/uL — ABNORMAL LOW (ref 150–400)
RBC: 3.79 MIL/uL — ABNORMAL LOW (ref 4.22–5.81)
RDW: 12.5 % (ref 11.5–15.5)
WBC: 7.1 10*3/uL (ref 4.0–10.5)
nRBC: 0 % (ref 0.0–0.2)

## 2019-09-02 LAB — COMPREHENSIVE METABOLIC PANEL
ALT: 20 U/L (ref 0–44)
AST: 31 U/L (ref 15–41)
Albumin: 3.5 g/dL (ref 3.5–5.0)
Alkaline Phosphatase: 109 U/L (ref 38–126)
Anion gap: 12 (ref 5–15)
BUN: 13 mg/dL (ref 8–23)
CO2: 23 mmol/L (ref 22–32)
Calcium: 8.4 mg/dL — ABNORMAL LOW (ref 8.9–10.3)
Chloride: 101 mmol/L (ref 98–111)
Creatinine, Ser: 1.53 mg/dL — ABNORMAL HIGH (ref 0.61–1.24)
GFR calc Af Amer: 52 mL/min — ABNORMAL LOW (ref >60)
GFR calc non Af Amer: 45 mL/min — ABNORMAL LOW (ref >60)
Glucose, Bld: 132 mg/dL — ABNORMAL HIGH (ref 70–99)
Potassium: 3.7 mmol/L (ref 3.5–5.1)
Sodium: 136 mmol/L (ref 135–145)
Total Bilirubin: 1 mg/dL (ref 0.3–1.2)
Total Protein: 6.6 g/dL (ref 6.5–8.1)

## 2019-09-02 NOTE — Assessment & Plan Note (Addendum)
#   ThromboCytopenia platelets -intermittent.  Today platelets- 105.  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  STABLE  Asymptomatic.  Continue monitoring.  # Chronic kidney disease-III-BL- 1.5 . [follows up with Dr.Chang; Nephrology; [facility]]-STABLE.  # HIV/AIDs-STABLE;. On Biktarvy.  # Alcoholism- currently on 6 beers/day;  denies hard liquor. Recommend abistinence of alcohol.   # DISPOSITION:  # follow-up in 1 year- MD- /CBC CMP- Dr.B

## 2019-09-02 NOTE — Progress Notes (Signed)
[place] OFFICE PROGRESS NOTE  Patient Care Team: Revelo, Elyse Jarvis, MD as PCP - General (Family Medicine)   SUMMARY OF HEMATOLOGIC/ONCOLOGIC HISTORY:  # MILD THROMBOCYTOPENIA [may 2017]- ? ITP vs alcohol 130-140s- monitor for now.  # FEB 2016- positive low level UPEP;April 2016- 24 h UPEP- Neg; SIEP/ K-L ratio=N; MAY 2018- NEG M protein; REPEAT June 2019-M PROTEIN- ABSENT  # HIV- undetectable [Dr.Fitzgerald]; Hx HepC [s/p treatment]; CKD [creat 1.7- 1.9]; Hx of Alcohol abuse.   INTERVAL HISTORY:  74 year old male patient with a history of chronic mild thrombocytopenia; HIV/alcohol noted in February 2016- is here for follow-up.  Denies any easy bruising or gum bleeding or nosebleeds.  States his HIV is under good control.  Admits to compliance with his HIV medications.  Continues to drink at least 6 beers a day.  Review of Systems  Constitutional: Negative for chills, diaphoresis, fever, malaise/fatigue and weight loss.  HENT: Negative for nosebleeds and sore throat.   Eyes: Negative for double vision.  Respiratory: Negative for cough, hemoptysis, sputum production, shortness of breath and wheezing.   Cardiovascular: Negative for chest pain, palpitations, orthopnea and leg swelling.  Gastrointestinal: Negative for abdominal pain, blood in stool, constipation, diarrhea, heartburn, melena, nausea and vomiting.  Genitourinary: Negative for dysuria, frequency and urgency.  Musculoskeletal: Positive for back pain and joint pain.  Skin: Negative.  Negative for itching and rash.  Neurological: Negative for dizziness, tingling, focal weakness, weakness and headaches.  Endo/Heme/Allergies: Does not bruise/bleed easily.  Psychiatric/Behavioral: Negative for depression. The patient is not nervous/anxious and does not have insomnia.      PAST MEDICAL HISTORY :  Past Medical History:  Diagnosis Date  . Alcohol abuse    self-reported  . Hernia,  abdominal   . HIV infection (HCC)    Sees Dr. Ola Spurr for this  . Inguinal hernia   . Kidney failure   . Neoplasm of skin     PAST SURGICAL HISTORY :   Past Surgical History:  Procedure Laterality Date  . CARPAL TUNNEL RELEASE Left   . HERNIA REPAIR  1997   Left Lower Abdomen- [place]  . HERNIA REPAIR  [id number]   [place]  . INGUINAL HERNIA REPAIR Left 1996   [place]  . INGUINAL HERNIA REPAIR Right 1993   [place]  . LAMINECTOMY    . OPEN ANTERIOR SHOULDER RECONSTRUCTION Right 08/2013   [facility]  . REPLACEMENT TOTAL KNEE Right   . REPLACEMENT TOTAL KNEE Left    [facility]  . UMBILICAL HERNIA REPAIR  1997   [place]    FAMILY HISTORY :   Family History  Problem Relation Age of Onset  . Diabetes Mother   . CAD Father 76    SOCIAL HISTORY:   Social History   Tobacco Use  . Smoking status: Former Smoker    Quit date: 06/18/2013    Years since quitting: 6.2  . Smokeless tobacco: Never Used  Substance Use Topics  . Alcohol use: Not Currently    Comment: last time was august 6,2019  . Drug use: No    Comment: Use to do cocaine 30 years ago    ALLERGIES:  has No Known Allergies.  MEDICATIONS:  Current Outpatient Medications  Medication Sig Dispense Refill  . bictegravir-emtricitabine-tenofovir AF (BIKTARVY) 50-200-25 MG TABS tablet Take 1 tablet by mouth daily.     . cyanocobalamin 1000 MCG tablet Take 1,000 mcg by mouth daily.    Mar[place] Kitchen  escitalopram (LEXAPRO) 5 MG tablet Take 5 mg by mouth daily.    Mar[place] Kitchen lisinopril (ZESTRIL) 2.5 MG tablet Take 2.5 mg by mouth daily.    Mar[place] Kitchen neomycin-bacitracin-polymyxin (NEOSPORIN) ointment Apply 1 application topically every 12 (twelve) hours. 15 g 0  . Omega-3 1000 MG CAPS Take 1 capsule by mouth daily.    Mar[place] Kitchen tolnaftate (ANTIFUNGAL SPRAY POWDER) 1 % spray Apply 1 application topically 2 (two) times daily.    . diclofenac sodium (VOLTAREN) 1 % GEL Apply 2 g topically 4 (four) times daily. (Patient not taking: Reported  on 09/08/4126) 786 g 0  . folic acid (FOLVITE) 1 MG tablet Take 1 tablet (1 mg total) by mouth daily. (Patient not taking: Reported on 09/02/2018) 30 tablet 0  . Multiple Vitamin (MULTIVITAMIN WITH MINERALS) TABS tablet Take 1 tablet by mouth daily. (Patient not taking: Reported on 10/29/2017) 30 tablet 0  . naltrexone (DEPADE) 50 MG tablet Take 50 mg by mouth daily. (Patient not taking: Reported on 09/02/2019)    . nystatin (MYCOSTATIN/NYSTOP) powder Apply topically 2 (two) times daily. (Patient not taking: Reported on 10/29/2017) 60 g 0  . thiamine 100 MG tablet Take 1 tablet (100 mg total) by mouth daily. (Patient not taking: Reported on 09/02/2018) 30 tablet 0  . traZODone (DESYREL) 50 MG tablet Take 1 tablet (50 mg total) by mouth at bedtime as needed for sleep. (Patient not taking: Reported on 09/02/2019) 30 tablet 0   No current facility-administered medications for this visit.    PHYSICAL EXAMINATION:   BP 109/79 (BP Location: Left Arm, Patient Position: Sitting, Cuff Size: Normal)   Pulse 86   Temp 98.3 F (36.8 C) (Tympanic)   Wt 190 lb 6 oz (86.4 kg)   BMI 27.32 kg/m   Filed Weights   09/02/19 1030  Weight: 190 lb 6 oz (86.4 kg)    Physical Exam HENT:     Head: Normocephalic and atraumatic.     Mouth/Throat:     Pharynx: No oropharyngeal exudate.  Eyes:     Pupils: Pupils are equal, round, and reactive to light.  Cardiovascular:     Rate and Rhythm: Normal rate and regular rhythm.  Pulmonary:     Effort: No respiratory distress.     Breath sounds: No wheezing.  Abdominal:     General: Bowel sounds are normal. There is no distension.     Palpations: Abdomen is soft. There is no mass.     Tenderness: There is no abdominal tenderness. There is no guarding or rebound.  Musculoskeletal:        General: No tenderness. Normal range of motion.     Cervical back: Normal range of motion and neck supple.  Skin:    General: Skin is warm.  Neurological:     Mental Status: He is  alert and oriented to person, place, and time.  Psychiatric:        Mood and Affect: Affect normal.     LABORATORY DATA:  I have reviewed the data as listed    Component Value Date/Time   NA 136 09/02/2019 0954   K 3.7 09/02/2019 0954   CL 101 09/02/2019 0954   CO2 23 09/02/2019 0954   GLUCOSE 132 (H) 09/02/2019 0954   BUN 13 09/02/2019 0954   CREATININE 1.53 (H) 09/02/2019 0954   CREATININE 1.91 (H) 05/30/2014 1001   CALCIUM 8.4 (L) 09/02/2019 0954   PROT 6.6 09/02/2019 0954   PROT 6.9 05/30/2014 1001   ALBUMIN 3.5  09/02/2019 0954   ALBUMIN 4.1 05/30/2014 1001   AST 31 09/02/2019 0954   AST 18 05/30/2014 1001   ALT 20 09/02/2019 0954   ALT 10 (L) 05/30/2014 1001   ALKPHOS 109 09/02/2019 0954   ALKPHOS 171 (H) 05/30/2014 1001   BILITOT 1.0 09/02/2019 0954   BILITOT 2.1 (H) 06/13/2014 1407   GFRNONAA 45 (L) 09/02/2019 0954   GFRNONAA 36 (L) 05/30/2014 1001   GFRAA 52 (L) 09/02/2019 0954   GFRAA 41 (L) 05/30/2014 1001    No results found for: SPEP, UPEP  Lab Results  Component Value Date   WBC 7.1 09/02/2019   NEUTROABS 4.7 09/02/2019   HGB 14.4 09/02/2019   HCT 40.4 09/02/2019   MCV 106.6 (H) 09/02/2019   PLT 105 (L) 09/02/2019      Chemistry      Component Value Date/Time   NA 136 09/02/2019 0954   K 3.7 09/02/2019 0954   CL 101 09/02/2019 0954   CO2 23 09/02/2019 0954   BUN 13 09/02/2019 0954   CREATININE 1.53 (H) 09/02/2019 0954   CREATININE 1.91 (H) 05/30/2014 1001      Component Value Date/Time   CALCIUM 8.4 (L) 09/02/2019 0954   ALKPHOS 109 09/02/2019 0954   ALKPHOS 171 (H) 05/30/2014 1001   AST 31 09/02/2019 0954   AST 18 05/30/2014 1001   ALT 20 09/02/2019 0954   ALT 10 (L) 05/30/2014 1001   BILITOT 1.0 09/02/2019 0954   BILITOT 2.1 (H) 06/13/2014 1407        ASSESSMENT & PLAN:  Thrombocytopenia ([place]) # ThromboCytopenia platelets -intermittent.  Today platelets- 105.  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  STABLE  Asymptomatic.   Continue monitoring.  # Chronic kidney disease-III-BL- 1.5 . [follows up with Dr.Chang; Nephrology; [facility]]-STABLE.  # HIV/AIDs-STABLE;. On Biktarvy.  # Alcoholism- currently on 6 beers/day;  denies hard liquor. Recommend abistinence of alcohol.   # DISPOSITION:  # follow-up in 1 year- MD- [facility] CMP- Dr.B        Cammie Sickle, MD 09/05/2019 9:01 AM

## 2019-10-14 ENCOUNTER — Emergency Department
Admission: EM | Admit: 2019-10-14 | Discharge: 2019-10-14 | Disposition: A | Discharge status: Home / Self Care | Payer: Medicare Other | Attending: Emergency Medicine | Admitting: Emergency Medicine

## 2019-10-14 ENCOUNTER — Emergency Department: Payer: Medicare Other

## 2019-10-14 DIAGNOSIS — Z87891 Personal history of nicotine dependence: Secondary | ICD-10-CM | POA: Diagnosis not present

## 2019-10-14 DIAGNOSIS — Z21 Asymptomatic human immunodeficiency virus [HIV] infection status: Secondary | ICD-10-CM | POA: Insufficient documentation

## 2019-10-14 DIAGNOSIS — R55 Syncope and collapse: Secondary | ICD-10-CM | POA: Diagnosis present

## 2019-10-14 DIAGNOSIS — Z96653 Presence of artificial knee joint, bilateral: Secondary | ICD-10-CM | POA: Insufficient documentation

## 2019-10-14 DIAGNOSIS — Z79899 Other long term (current) drug therapy: Secondary | ICD-10-CM | POA: Diagnosis not present

## 2019-10-14 DIAGNOSIS — I959 Hypotension, unspecified: Secondary | ICD-10-CM | POA: Insufficient documentation

## 2019-10-14 LAB — URINE DRUG SCREEN, QUALITATIVE (ARMC ONLY)
Amphetamines, Ur Screen: NOT DETECTED
Barbiturates, Ur Screen: NOT DETECTED
Benzodiazepine, Ur Scrn: NOT DETECTED
Cannabinoid 50 Ng, Ur ~~LOC~~: NOT DETECTED
Cocaine Metabolite,Ur ~~LOC~~: NOT DETECTED
MDMA (Ecstasy)Ur Screen: NOT DETECTED
Methadone Scn, Ur: NOT DETECTED
Opiate, Ur Screen: NOT DETECTED
Phencyclidine (PCP) Ur S: NOT DETECTED
Tricyclic, Ur Screen: NOT DETECTED

## 2019-10-14 LAB — URINALYSIS, COMPLETE (UACMP) WITH MICROSCOPIC
Bacteria, UA: NONE SEEN
Bilirubin Urine: NEGATIVE
Glucose, UA: 50 mg/dL — AB
Ketones, ur: NEGATIVE mg/dL
Leukocytes,Ua: NEGATIVE
Nitrite: NEGATIVE
Protein, ur: NEGATIVE mg/dL
Specific Gravity, Urine: 1.005 (ref 1.005–1.030)
WBC, UA: NONE SEEN WBC/hpf (ref 0–5)
pH: 6 (ref 5.0–8.0)

## 2019-10-14 LAB — COMPREHENSIVE METABOLIC PANEL
ALT: 16 U/L (ref 0–44)
AST: 29 U/L (ref 15–41)
Albumin: 3.1 g/dL — ABNORMAL LOW (ref 3.5–5.0)
Alkaline Phosphatase: 90 U/L (ref 38–126)
Anion gap: 12 (ref 5–15)
BUN: 13 mg/dL (ref 8–23)
CO2: 20 mmol/L — ABNORMAL LOW (ref 22–32)
Calcium: 7.8 mg/dL — ABNORMAL LOW (ref 8.9–10.3)
Chloride: 100 mmol/L (ref 98–111)
Creatinine, Ser: 1.36 mg/dL — ABNORMAL HIGH (ref 0.61–1.24)
GFR calc Af Amer: >60 mL/min (ref >60)
GFR calc non Af Amer: 52 mL/min — ABNORMAL LOW (ref >60)
Glucose, Bld: 91 mg/dL (ref 70–99)
Potassium: 3.5 mmol/L (ref 3.5–5.1)
Sodium: 132 mmol/L — ABNORMAL LOW (ref 135–145)
Total Bilirubin: 1 mg/dL (ref 0.3–1.2)
Total Protein: 5.8 g/dL — ABNORMAL LOW (ref 6.5–8.1)

## 2019-10-14 LAB — CBC WITH DIFFERENTIAL/PLATELET
Abs Immature Granulocytes: 0.09 10*3/uL — ABNORMAL HIGH (ref 0.00–0.07)
Basophils Absolute: 0 10*3/uL (ref 0.0–0.1)
Basophils Relative: 1 %
Eosinophils Absolute: 0.1 10*3/uL (ref 0.0–0.5)
Eosinophils Relative: 1 %
HCT: 36.7 % — ABNORMAL LOW (ref 39.0–52.0)
Hemoglobin: 13 g/dL (ref 13.0–17.0)
Immature Granulocytes: 1 %
Lymphocytes Relative: 24 %
Lymphs Abs: 1.8 10*3/uL (ref 0.7–4.0)
MCH: 38.7 pg — ABNORMAL HIGH (ref 26.0–34.0)
MCHC: 35.4 g/dL (ref 30.0–36.0)
MCV: 109.2 fL — ABNORMAL HIGH (ref 80.0–100.0)
Monocytes Absolute: 0.7 10*3/uL (ref 0.1–1.0)
Monocytes Relative: 10 %
Neutro Abs: 4.8 10*3/uL (ref 1.7–7.7)
Neutrophils Relative %: 63 %
Platelets: 155 10*3/uL (ref 150–400)
RBC: 3.36 MIL/uL — ABNORMAL LOW (ref 4.22–5.81)
RDW: 12.3 % (ref 11.5–15.5)
WBC: 7.6 10*3/uL (ref 4.0–10.5)
nRBC: 0 % (ref 0.0–0.2)

## 2019-10-14 LAB — ETHANOL: Alcohol, Ethyl (B): 266 mg/dL — ABNORMAL HIGH (ref <10)

## 2019-10-14 LAB — LACTIC ACID, PLASMA: Lactic Acid, Venous: 3.4 mmol/L (ref 0.5–1.9)

## 2019-10-14 LAB — BRAIN NATRIURETIC PEPTIDE: B Natriuretic Peptide: 44.8 pg/mL (ref 0.0–100.0)

## 2019-10-14 LAB — TROPONIN I (HIGH SENSITIVITY): Troponin I (High Sensitivity): 5 ng/L (ref <18)

## 2019-10-14 LAB — ACETAMINOPHEN LEVEL: Acetaminophen (Tylenol), Serum: <10 ug/mL — ABNORMAL LOW (ref 10–30)

## 2019-10-14 LAB — SALICYLATE LEVEL: Salicylate Lvl: <7 mg/dL — ABNORMAL LOW (ref 7.0–30.0)

## 2019-10-14 MED ORDER — SODIUM CHLORIDE 0.9 % IV BOLUS
1000.0000 mL | Freq: Once | INTRAVENOUS | Status: AC
  Administered 2019-10-14: 1000 mL via INTRAVENOUS

## 2019-10-14 NOTE — ED Notes (Signed)
Patient's son updated via telephone per patient request.

## 2019-10-14 NOTE — ED Notes (Signed)
Pt hitting call bell requesting to leave, wants to go home, threatening to just walk out.  MD notified, AMA dispo set, patient hitting call bell again.

## 2019-10-14 NOTE — ED Triage Notes (Signed)
BIBA from home for unwitnessed syncope. Patient passed out near his front door and neighbors called 911. Patient was up and awake on EMS arrival, no complaints. Initial BP 85/palp, FSBG 122, +EtOH, rec'd 511mL NS PTA.

## 2019-10-14 NOTE — ED Notes (Signed)
Pt hitting call bell immediately again wanting to leave, taking off stickers and monitoring cords.  Pt states will call a cab and has money for it.  MD aware of plan.  Patient shown to lobby and phone to call cab, front desk aware.  Pt ambulatory with steady gait, chest rise even and unlabored, in NAD at this time.

## 2019-10-14 NOTE — ED Provider Notes (Signed)
[facility] Emergency Department Provider Note   ____________________________________________   First MD Initiated Contact with Patient 10/14/19 1845     (approximate)  I have reviewed the triage vital signs and the nursing notes.   HISTORY  Chief Complaint Loss of Consciousness and Hypotension   HPI Anthony Olson. is a 74 y.o. male patient reports he has had a couple episodes of hypotension with syncope and when he wakes up and one time it happened in the hospital he was in the ICU with acute kidney injury.  Today he was outside he had not been doing anything particularly strenuous and he felt fine was by his house and just woke up on the ground.  When he woke up on the ground he was extremely weak and could not stand up.  Neighbor called 911 for him.  He comes in here is blood pressure still 92 systolic.  He had no headache chest pain shortness of breath or any other problems.         Past Medical History:  Diagnosis Date  . Alcohol abuse    self-reported  . Hernia, abdominal   . HIV infection (HCC)    Sees Dr. Ola Spurr for this  . Inguinal hernia   . Kidney failure   . Neoplasm of skin     Patient Active Problem List   Diagnosis Date Noted  . Unilateral recurrent inguinal hernia without obstruction or gangrene 10/02/2017  . Syncope 09/18/2017  . 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 Thrombocytopenia ([place]) 07/19/2016  . Macrocytosis 03/20/2015  . Kidney disease 03/20/2015  . Peripheral vascular disease ([place]) 03/20/2015  . Hypertensive disorder 03/20/2015  . Peripheral neuritis 03/20/2015  . Seborrheic dermatitis 03/20/2015  . Right inguinal hernia 02/01/2015  . Right hip pain 02/01/2015  . H/O neoplasm 10/03/2014  . History of nonmelanoma skin cancer 10/03/2014  . 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 Spondylosis of lumbar region without myelopathy or radiculopathy 04/25/2014  .  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 LBP (low back pain) 03/11/2014  . Acute confusion 02/17/2014  . Acute renal failure ([place]) 02/17/2014  . Arterial blood pressure decreased 02/17/2014  . Elevated WBC count 02/17/2014  . Hypotension 02/17/2014  . Anxiety and depression 02/15/2014  . Personal history of other infectious and parasitic diseases 02/15/2014  . 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  . Arthritis of knee, degenerative 12/13/2013  . Derangement of posterior horn of medial meniscus due to old injury, left 12/13/2013  . Primary osteoarthritis of left knee 12/13/2013  . Subacromial impingement of right shoulder 08/19/2013  . Acid reflux 08/02/2013  . Human immunodeficiency virus (HIV) infection ([place]) 08/02/2013  . Biceps tendonitis on right 07/30/2013  . Rotator cuff syndrome of left shoulder 07/30/2013    Past Surgical History:  Procedure Laterality Date  . CARPAL TUNNEL RELEASE Left   . HERNIA REPAIR  1997   Left Lower Abdomen- [place]  . HERNIA REPAIR  [id number]   [place]  . INGUINAL HERNIA REPAIR Left 1996   [place]  . INGUINAL HERNIA REPAIR Right 1993   [place]  . LAMINECTOMY    . OPEN ANTERIOR SHOULDER RECONSTRUCTION Right 08/2013   [facility]  . REPLACEMENT TOTAL KNEE Right   . REPLACEMENT TOTAL KNEE Left    [facility]  . UMBILICAL HERNIA REPAIR  1997   [place]    Prior to Admission medications   Medication Sig Start Date  End Date Taking? Authorizing Provider  bictegravir-emtricitabine-tenofovir AF (BIKTARVY) 50-200-25 MG TABS tablet Take 1 tablet by mouth daily.  04/21/17   [provider]  cyanocobalamin 1000 MCG tablet Take 1,000 mcg by mouth daily.    [provider]  diclofenac sodium (VOLTAREN) 1 % GEL Apply 2 g topically 4 (four) times daily. Patient not taking: Reported on 09/02/2018 12/18/17   Laban Emperor, PA-C  escitalopram (LEXAPRO) 5 MG tablet Take 5 mg by mouth daily. 10/20/17   [provider]  folic acid (FOLVITE) 1 MG tablet Take 1 tablet (1 mg total) by mouth daily. Patient not taking: Reported on 09/02/2018 09/23/17   Loletha Grayer, MD  lisinopril (ZESTRIL) 2.5 MG tablet Take 2.5 mg by mouth daily. 07/27/19   [provider]  Multiple Vitamin (MULTIVITAMIN WITH MINERALS) TABS tablet Take 1 tablet by mouth daily. Patient not taking: Reported on 10/29/2017 09/23/17   Loletha Grayer, MD  naltrexone (DEPADE) 50 MG tablet Take 50 mg by mouth daily. Patient not taking: Reported on 09/02/2019 10/02/17   [provider]  neomycin-bacitracin-polymyxin (NEOSPORIN) ointment Apply 1 application topically every 12 (twelve) hours. 12/18/17   Laban Emperor, PA-C  nystatin (MYCOSTATIN/NYSTOP) powder Apply topically 2 (two) times daily. Patient not taking: Reported on 10/29/2017 09/22/17   Loletha Grayer, MD  Omega-3 1000 MG CAPS Take 1 capsule by mouth daily.    [provider]  thiamine 100 MG tablet Take 1 tablet (100 mg total) by mouth daily. Patient not taking: Reported on 09/02/2018 09/23/17   Loletha Grayer, MD  tolnaftate (ANTIFUNGAL SPRAY POWDER) 1 % spray Apply 1 application topically 2 (two) times daily.    [provider]  traZODone (DESYREL) 50 MG tablet Take 1 tablet (50 mg total) by mouth at bedtime as needed for sleep. Patient not taking: Reported on 09/02/2019 09/22/17   Loletha Grayer, MD    Allergies Patient has no known allergies.  Family History  Problem Relation Age of Onset  . Diabetes Mother   . CAD Father 68    Social History Social History   Tobacco Use  . Smoking status: Former Smoker    Quit date: 06/18/2013    Years since quitting: 6.3  . Smokeless tobacco: Never Used  Substance Use Topics  . Alcohol use: Not Currently    Comment: last time was august 6,2019  . Drug use: No    Comment: Use to do cocaine 30 years ago    Review of Systems  Constitutional: No fever/chills Eyes: No visual changes. ENT: No sore  throat. Cardiovascular: Denies chest pain. Respiratory: Denies shortness of breath. Gastrointestinal: No abdominal pain.  No nausea, no vomiting.  No diarrhea.  No constipation. Genitourinary: Negative for dysuria. Musculoskeletal: Negative for back pain. Skin: Negative for rash. Neurological: Negative for headaches, focal weakness  ____________________________________________   PHYSICAL EXAM:  VITAL SIGNS: ED Triage Vitals [10/14/19 1818]  Enc Vitals Group     BP 92/63     Pulse Rate 76     Resp 17     Temp 98 F (36.7 C)     Temp Source Oral     SpO2 94 %     Weight 195 lb (88.5 kg)     Height 5\' 10"  (1.778 m)     Head Circumference      Peak Flow      Pain Score 0     Pain Loc      Pain Edu?  Excl. in [place]?     Constitutional: Alert and oriented. Well appearing and in no acute distress. Eyes: Conjunctivae are normal. PER EOMI. Head: Atraumatic. Nose: No congestion/rhinnorhea. Mouth/Throat: Mucous membranes are moist.  Oropharynx non-erythematous. Neck: No stridor.  Cardiovascular: Normal rate, regular rhythm. Grossly normal heart sounds.  Good peripheral circulation. Respiratory: Normal respiratory effort.  No retractions. Lungs CTAB. Gastrointestinal: Soft and nontender. No distention. No abdominal bruits. Musculoskeletal: No lower extremity tenderness nor edema.   Neurologic:  Normal speech and language. No gross focal neurologic deficits are appreciated.  Skin:  Skin is warm, dry and intact. No rash noted.   ____________________________________________   LABS (all labs ordered are listed, but only abnormal results are displayed)  Labs Reviewed  CBC WITH DIFFERENTIAL/PLATELET - Abnormal; Notable for the following components:      Result Value   RBC 3.36 (*)    HCT 36.7 (*)    MCV 109.2 (*)    MCH 38.7 (*)    Abs Immature Granulocytes 0.09 (*)    All other components within normal limits  COMPREHENSIVE METABOLIC PANEL  ACETAMINOPHEN LEVEL  ETHANOL   LACTIC ACID, PLASMA  LACTIC ACID, PLASMA  SALICYLATE LEVEL  BRAIN NATRIURETIC PEPTIDE  URINALYSIS, COMPLETE (UACMP) WITH MICROSCOPIC  URINE DRUG SCREEN, QUALITATIVE ([facility] ONLY)  TROPONIN I (HIGH SENSITIVITY)   ____________________________________________  EKG  EKG read interpreted by me shows normal sinus rhythm rate of 76 normal axis essentially normal EKG ____________________________________________  RADIOLOGY  ED MD interpretation:   Official radiology report(s): DG Chest Portable 1 View  Result Date: 10/14/2019 CLINICAL DATA:  74 year old male with syncope. EXAM: PORTABLE CHEST 1 VIEW COMPARISON:  Chest radiograph dated 07/16/2019 and CT dated 07/17/2019. FINDINGS: No focal consolidation, pleural effusion, pneumothorax. The cardiac silhouette is within limits. No acute osseous pathology. Old left rib fracture deformities. IMPRESSION: No active cardiopulmonary disease. Electronically Signed   By: Anner Crete M.D.   On: 10/14/2019 19:08    ____________________________________________   PROCEDURES  Procedure(s) performed (including Critical Care):  Procedures   ____________________________________________   INITIAL IMPRESSION / ASSESSMENT AND PLAN / ED COURSE  Patient with syncope and hypotension.  He could be due to an arrhythmia or something even like a carcinoid tumor.  He does not have a fever he is not coughing or having dysuria headache chest pain so it is unlikely to be a heart attack or sepsis.  We will get his lab work back and check and see what is going on.  I anticipate he will have to be in the hospital for this. As I am paging the hospitalist the patient decides he wants to go home.  He will not stay.  Cannot talk him into staying.  He understands that he could die if he goes home but he will not stay.  Therefore I will let him go AMA.  He knows to return if he gets any further problems or changes his mind.              ____________________________________________   FINAL CLINICAL IMPRESSION(S) / ED DIAGNOSES  Final diagnoses:  Syncope, unspecified syncope type     ED Discharge Orders    None       Note:  This document was prepared using Dragon voice recognition software and may include unintentional dictation errors.    Nena Polio, MD 10/15/19 [phone]

## 2019-10-14 NOTE — Discharge Instructions (Signed)
I really wish you would stay in the hospital.  We might be able to find out why you passed out.  I do not want you to pass out again you might not wake up next time.  If you have to leave please make sure you follow-up with your doctor.

## 2019-10-28 ENCOUNTER — Ambulatory Visit (INDEPENDENT_AMBULATORY_CARE_PROVIDER_SITE_OTHER): Payer: Medicare Other | Admitting: Cardiology

## 2019-10-28 ENCOUNTER — Encounter: Payer: Self-pay | Admitting: Cardiology

## 2019-10-28 ENCOUNTER — Ambulatory Visit (INDEPENDENT_AMBULATORY_CARE_PROVIDER_SITE_OTHER): Payer: Medicare Other

## 2019-10-28 ENCOUNTER — Other Ambulatory Visit: Payer: Self-pay

## 2019-10-28 VITALS — BP 116/77 | HR 73 | Ht 70.0 in | Wt 187.5 lb

## 2019-10-28 DIAGNOSIS — IMO0001 Reserved for inherently not codable concepts without codable children: Secondary | ICD-10-CM

## 2019-10-28 DIAGNOSIS — R55 Syncope and collapse: Secondary | ICD-10-CM

## 2019-10-28 DIAGNOSIS — F17200 Nicotine dependence, unspecified, uncomplicated: Secondary | ICD-10-CM

## 2019-10-28 DIAGNOSIS — F1010 Alcohol abuse, uncomplicated: Secondary | ICD-10-CM | POA: Diagnosis not present

## 2019-10-28 DIAGNOSIS — E7800 Pure hypercholesterolemia, unspecified: Secondary | ICD-10-CM | POA: Diagnosis not present

## 2019-10-28 NOTE — Patient Instructions (Signed)
Medication Instructions:  Your physician recommends that you continue on your current medications as directed. Please refer to the Current Medication list given to you today.  *If you need a refill on your cardiac medications before your next appointment, please call your pharmacy*   Lab Work: None Ordered If you have labs (blood work) drawn today and your tests are completely normal, you will receive your results only by: . MyChart Message (if you have MyChart) OR . A paper copy in the mail If you have any lab test that is abnormal or we need to change your treatment, we will call you to review the results.   Testing/Procedures:  Your physician has recommended that you wear a Zio monitor. This monitor is a medical device that records the heart's electrical activity. Doctors most often use these monitors to diagnose arrhythmias. Arrhythmias are problems with the speed or rhythm of the heartbeat. The monitor is a small device applied to your chest. You can wear one while you do your normal daily activities. While wearing this monitor if you have any symptoms to push the button and record what you felt. Once you have worn this monitor for the period of time provider prescribed (Usually 14 days), you will return the monitor device in the postage paid box. Once it is returned they will download the data collected and provide us with a report which the provider will then review and we will call you with those results. Important tips:  1. Avoid showering during the first 24 hours of wearing the monitor. 2. Avoid excessive sweating to help maximize wear time. 3. Do not submerge the device, no hot tubs, and no swimming pools. 4. Keep any lotions or oils away from the patch. 5. After 24 hours you may shower with the patch on. Take brief showers with your back facing the shower head.  6. Do not remove patch once it has been placed because that will interrupt data and decrease adhesive wear  time. 7. Push the button when you have any symptoms and write down what you were feeling. 8. Once you have completed wearing your monitor, remove and place into box which has postage paid and place in your outgoing mailbox.  9. If for some reason you have misplaced your box then call our office and we can provide another box and/or mail it off for you.        Follow-Up: At [facility], you and your health needs are our priority.  As part of our continuing mission to provide you with exceptional heart care, we have created designated Provider Care Teams.  These Care Teams include your primary Cardiologist (physician) and Advanced Practice Providers (APPs -  Physician Assistants and Nurse Practitioners) who all work together to provide you with the care you need, when you need it.  We recommend signing up for the patient portal called "MyChart".  Sign up information is provided on this After Visit Summary.  MyChart is used to connect with patients for Virtual Visits (Telemedicine).  Patients are able to view lab/test results, encounter notes, upcoming appointments, etc.  Non-urgent messages can be sent to your provider as well.   To learn more about what you can do with MyChart, go to [email or web address].    Your next appointment:   5 week(s)  The format for your next appointment:   In Person  Provider:   Brian Agbor-Etang, MD   Other Instructions  

## 2019-10-28 NOTE — Progress Notes (Signed)
Cardiology Office Note:    Date:  10/28/2019   ID:  Anthony Must., DOB [birth year], MRN [id number]  PCP:  Anthony Burrow, MD  [place] Cardiologist:  Anthony Sable, MD  [place] Electrophysiologist:  None   Referring MD: Anthony Olson*   Chief Complaint  Patient presents with  . office visit    Referred by Dr. Alene Olson for syncope; Meds verbally reviewed with patient.    History of Present Illness:    Anthony Olson. is a 74 y.o. male with a hx of HIV, CKD-3, hypertension, hyperlipidemia, alcohol abuse who presents due to hypotension and syncope.  Patient has had symptoms of passing out for over 2 years now.  Symptoms seems to have worsening of late with episodes occurring about once a week.  He denies any prodromal symptoms prior to passing out.  Denies any chest pain, shortness of breath, dizziness, palpitations, blurry visions, headaches, leg weakness.  He states he has "no warning" prior to passing out.  Had an episode that he was standing up from a seated position and then passed out.  Other times he was cooking dinner, walked to his living room to sit and then fell headfirst.  On another occasion, he came into the house from working outside and fell while trying to sit.  He states drinking roughly 6 packs of beers daily for over 20 years.  Has just picked up smoking again due to stress.  States he knows he needs to cut back on drinking and drink more water as he does not drink enough water.  Takes lisinopril for CKD.  Patient recently seen in the ED on 10/14/2019 due to loss of consciousness and hypotension.  He does remember waking up on the ground, feeling weak and could not stand.  In the ED blood pressure was 92 systolic.  Denied chest pain or shortness of breath.  He left AMA without getting full evaluation at the time.  Patient had an echocardiogram on 09/2017 due to a syncopal episode.  Echo showed normal systolic function, EF 60 to 65%, no  valvular abnormality or structural changes noted to suggest etiology for syncope.  Past Medical History:  Diagnosis Date  . Alcohol abuse    self-reported  . Hernia, abdominal   . HIV infection (HCC)    Sees Dr. Ola Olson for this  . Inguinal hernia   . Kidney failure   . Neoplasm of skin     Past Surgical History:  Procedure Laterality Date  . CARPAL TUNNEL RELEASE Left   . HERNIA REPAIR  1997   Left Lower Abdomen- [place]  . HERNIA REPAIR  [id number]   [place]  . INGUINAL HERNIA REPAIR Left 1996   [place]  . INGUINAL HERNIA REPAIR Right 1993   [place]  . LAMINECTOMY    . OPEN ANTERIOR SHOULDER RECONSTRUCTION Right 08/2013   [facility]  . REPLACEMENT TOTAL KNEE Right   . REPLACEMENT TOTAL KNEE Left    [facility]  . UMBILICAL HERNIA REPAIR  1997   [place]    Current Medications: Current Meds  Medication Sig  . atorvastatin (LIPITOR) 20 MG tablet Take 20 mg by mouth daily.  Marland Kitchen b complex vitamins tablet Take 1 tablet by mouth daily.  . diclofenac Sodium (VOLTAREN) 1 % GEL Apply topically as needed.  . Dolutegravir-Rilpivirine 50-25 MG TABS Take 1 tablet by mouth daily.   Marland Kitchen escitalopram (LEXAPRO) 10 MG tablet Take 30 mg by mouth  daily.  Marland Kitchen KRILL OIL PO Take by mouth daily.  Marland Kitchen lisinopril (ZESTRIL) 2.5 MG tablet Take 2.5 mg by mouth daily.  Marland Kitchen PROAIR HFA 108 (90 Base) MCG/ACT inhaler Inhale into the lungs.     Allergies:   Nicotine   Social History   Socioeconomic History  . Marital status: Single    Spouse name: Not on file  . Number of children: Not on file  . Years of education: Not on file  . Highest education level: Not on file  Occupational History  . Not on file  Tobacco Use  . Smoking status: Current Every Day Smoker    Types: Cigarettes    Last attempt to quit: 06/18/2013    Years since quitting: 6.3  . Smokeless tobacco: Never Used  . Tobacco comment: 10 cigarettes per day  Vaping Use  . Vaping Use: Never used  Substance and  Sexual Activity  . Alcohol use: Yes    Comment: 6 pack per day  . Drug use: No    Comment: Use to do cocaine 30 years ago  . Sexual activity: Not on file  Other Topics Concern  . Not on file  Social History Narrative  . Not on file   Social Determinants of Health   Financial Resource Strain:   . Difficulty of Paying Living Expenses: Not on file  Food Insecurity:   . Worried About Charity fundraiser in the Last Year: Not on file  . Ran Out of Food in the Last Year: Not on file  Transportation Needs:   . Lack of Transportation (Medical): Not on file  . Lack of Transportation (Non-Medical): Not on file  Physical Activity:   . Days of Exercise per Week: Not on file  . Minutes of Exercise per Session: Not on file  Stress:   . Feeling of Stress : Not on file  Social Connections:   . Frequency of Communication with Friends and Family: Not on file  . Frequency of Social Gatherings with Friends and Family: Not on file  . Attends Religious Services: Not on file  . Active Member of Clubs or Organizations: Not on file  . Attends Archivist Meetings: Not on file  . Marital Status: Not on file     Family History: The patient's family history includes CAD (age of onset: 65) in his father; Diabetes in his mother.  ROS:   Please see the history of present illness.     All other systems reviewed and are negative.  EKGs/Labs/Other Studies Reviewed:    The following studies were reviewed today:  EKG:  EKG is  ordered today.  The ekg ordered today demonstrates normal sinus rhythm, nonspecific ST changes.  Recent Labs: 10/14/2019: ALT 16; B Natriuretic Peptide 44.8; BUN 13; Creatinine, Ser 1.36; Hemoglobin 13.0; Platelets 155; Potassium 3.5; Sodium 132  Recent Lipid Panel No results found for: CHOL, TRIG, HDL, CHOLHDL, VLDL, LDLCALC, LDLDIRECT  Physical Exam:    VS:  BP 116/77 (BP Location: Right Arm, Patient Position: Sitting, Cuff Size: Normal)   Pulse 73   Ht 5\' 10"   (1.778 m)   Wt 187 lb 8 oz (85 kg)   SpO2 95%   BMI 26.90 kg/m     Wt Readings from Last 3 Encounters:  10/28/19 187 lb 8 oz (85 kg)  10/14/19 195 lb (88.5 kg)  09/02/19 190 lb 6 oz (86.4 kg)     GEN:  Well nourished, well developed in no acute distress  HEENT: Normal NECK: No JVD; No carotid bruits LYMPHATICS: No lymphadenopathy CARDIAC: RRR, no murmurs, rubs, gallops RESPIRATORY:  Clear to auscultation without rales, wheezing or rhonchi  ABDOMEN: Soft, non-tender, non-distended MUSCULOSKELETAL:  No edema; bruising noted in both arms SKIN: Warm and dry NEUROLOGIC:  Alert and oriented x 3 PSYCHIATRIC:  Normal affect   ASSESSMENT:    1. Syncope, unspecified syncope type   2. Pure hypercholesterolemia   3. ETOH abuse   4. Smoking    PLAN:    In order of problems listed above:  1. Patient with unspecific syncope.  His previous echocardiogram performed for syncope in 2019 with no structural abnormalities.  We will place a cardiac monitor to evaluate for any significant arrhythmias.  Orthostatic vitals in the office today with no evidence for orthostasis.  Although he was noted to be hypotensive once when he presented to the ED.  Not sure if alcohol is the culprit but I think might be contributing as there are reported studies showing that alcohol consumption elicits hypotension and impairs vasoconstriction.  Regardless, adequate hydration advised, reduction in alcohol consumption and ultimate cessation advised. 2. History of hyperlipidemia, continue statin. 3. History of alcohol abuse, drinks about 3 beers daily.  Cessation advised.  Adequate hydration advised. 4. Current smoker, cessation advised.   Follow-up after cardiac monitor.  This note was generated in part or whole with voice recognition software. Voice recognition is usually quite accurate but there are transcription errors that can and very often do occur. I apologize for any typographical errors that were not  detected and corrected.  Medication Adjustments/Labs and Tests Ordered: Current medicines are reviewed at length with the patient today.  Concerns regarding medicines are outlined above.  Orders Placed This Encounter  Procedures  . LONG TERM MONITOR (3-14 DAYS)  . EKG 12-Lead   No orders of the defined types were placed in this encounter.   Patient Instructions  Medication Instructions:   Your physician recommends that you continue on your current medications as directed. Please refer to the Current Medication list given to you today.  *If you need a refill on your cardiac medications before your next appointment, please call your pharmacy*   Lab Work: None Ordered If you have labs (blood work) drawn today and your tests are completely normal, you will receive your results only by: Marland Kitchen MyChart Message (if you have MyChart) OR . A paper copy in the mail If you have any lab test that is abnormal or we need to change your treatment, we will call you to review the results.   Testing/Procedures: Your physician has recommended that you wear a Zio monitor. This monitor is a medical device that records the heart's electrical activity. Doctors most often use these monitors to diagnose arrhythmias. Arrhythmias are problems with the speed or rhythm of the heartbeat. The monitor is a small device applied to your chest. You can wear one while you do your normal daily activities. While wearing this monitor if you have any symptoms to push the button and record what you felt. Once you have worn this monitor for the period of time provider prescribed (Usually 14 days), you will return the monitor device in the postage paid box. Once it is returned they will download the data collected and provide [place] with a report which the provider will then review and we will call you with those results. Important tips:  1. Avoid showering during the first 24 hours of wearing the monitor. 2. Avoid  excessive sweating  to help maximize wear time. 3. Do not submerge the device, no hot tubs, and no swimming pools. 4. Keep any lotions or oils away from the patch. 5. After 24 hours you may shower with the patch on. Take brief showers with your back facing the shower head.  6. Do not remove patch once it has been placed because that will interrupt data and decrease adhesive wear time. 7. Push the button when you have any symptoms and write down what you were feeling. 8. Once you have completed wearing your monitor, remove and place into box which has postage paid and place in your outgoing mailbox.  9. If for some reason you have misplaced your box then call our office and we can provide another box and/or mail it off for you.         Follow-Up: At [facility], you and your health needs are our priority.  As part of our continuing mission to provide you with exceptional heart care, we have created designated Provider Care Teams.  These Care Teams include your primary Cardiologist (physician) and Advanced Practice Providers (APPs -  Physician Assistants and Nurse Practitioners) who all work together to provide you with the care you need, when you need it.  We recommend signing up for the patient portal called "MyChart".  Sign up information is provided on this After Visit Summary.  MyChart is used to connect with patients for Virtual Visits (Telemedicine).  Patients are able to view lab/test results, encounter notes, upcoming appointments, etc.  Non-urgent messages can be sent to your provider as well.   To learn more about what you can do with MyChart, go to [email or web address].    Your next appointment:   5 week(s)  The format for your next appointment:   In Person  Provider:   Kate Sable, MD   Other Instructions      Signed, Anthony Sable, MD  10/28/2019 2:42 PM    [place]

## 2019-11-10 DIAGNOSIS — M79606 Pain in leg, unspecified: Secondary | ICD-10-CM | POA: Insufficient documentation

## 2019-11-10 NOTE — Progress Notes (Signed)
MRN : [id number]  Anthony Olson. is a 74 y.o. (1945-12-02) male who presents with chief complaint of No chief complaint on file. Marland Kitchen  History of Present Illness:   The patient is seen for evaluation of painful lower extremities. Patient notes the pain is variable and not always associated with activity.  The pain is somewhat consistent day to day occurring on most days. The patient notes the pain also occurs with standing and routinely seems worse as the day wears on. The pain has been progressive over the past several years. The patient states these symptoms are causing  a profound negative impact on quality of life and daily activities.  The patient denies rest pain or dangling of an extremity off the side of the bed during the night for relief. No open wounds or sores at this time. No history of DVT or phlebitis. No prior interventions or surgeries.  There is a  history of knee problems and DJD of multiple joints  No outpatient medications have been marked as taking for the 11/11/19 encounter (Appointment) with Delana Meyer, Dolores Lory, MD.    Past Medical History:  Diagnosis Date  . Alcohol abuse    self-reported  . Hernia, abdominal   . HIV infection (HCC)    Sees Dr. Ola Spurr for this  . Inguinal hernia   . Kidney failure   . Neoplasm of skin     Past Surgical History:  Procedure Laterality Date  . CARPAL TUNNEL RELEASE Left   . HERNIA REPAIR  1997   Left Lower Abdomen- [place]  . HERNIA REPAIR  8841   [place]  . INGUINAL HERNIA REPAIR Left 1996   [place]  . INGUINAL HERNIA REPAIR Right 1993   [place]  . LAMINECTOMY    . OPEN ANTERIOR SHOULDER RECONSTRUCTION Right 08/2013   [facility]  . REPLACEMENT TOTAL KNEE Right   . REPLACEMENT TOTAL KNEE Left    [facility]  . UMBILICAL HERNIA REPAIR  1997   [place]    Social History Social History   Tobacco Use  . Smoking status: Current Every Day Smoker    Types: Cigarettes    Last attempt to  quit: 06/18/2013    Years since quitting: 6.4  . Smokeless tobacco: Never Used  . Tobacco comment: 10 cigarettes per day  Vaping Use  . Vaping Use: Never used  Substance Use Topics  . Alcohol use: Yes    Comment: 6 pack per day  . Drug use: No    Comment: Use to do cocaine 30 years ago    Family History Family History  Problem Relation Age of Onset  . Diabetes Mother   . CAD Father 56  No family history of bleeding/clotting disorders, porphyria or autoimmune disease   Allergies  Allergen Reactions  . Nicotine     Nicotine patches (mild reaction)     REVIEW OF SYSTEMS (Negative unless checked)  Constitutional: [] Weight loss  [] Fever  [] Chills Cardiac: [] Chest pain   [] Chest pressure   [] Palpitations   [] Shortness of breath when laying flat   [] Shortness of breath with exertion. Vascular:  [] Pain in legs with walking   [x] Pain in legs at rest  [] History of DVT   [] Phlebitis   [] Swelling in legs   [] Varicose veins   [] Non-healing ulcers Pulmonary:   [] Uses home oxygen   [] Productive cough   [] Hemoptysis   [] Wheeze  [] COPD   [] Asthma Neurologic:  [] Dizziness   [] Seizures   []   History of stroke   [] History of TIA  [] Aphasia   [] Vissual changes   [] Weakness or numbness in arm   [] Weakness or numbness in leg Musculoskeletal:   [] Joint swelling   [x] Joint pain   [] Low back pain Hematologic:  [] Easy bruising  [] Easy bleeding   [] Hypercoagulable state   [] Anemic Gastrointestinal:  [] Diarrhea   [] Vomiting  [x] Gastroesophageal reflux/heartburn   [] Difficulty swallowing. Genitourinary:  [] Chronic kidney disease   [] Difficult urination  [] Frequent urination   [] Blood in urine Skin:  [] Rashes   [] Ulcers  Psychological:  [] History of anxiety   []  History of major depression.  Physical Examination  There were no vitals filed for this visit. There is no height or weight on file to calculate BMI. Gen: WD/WN, NAD Head: [place]/AT, No temporalis wasting.  Ear/Nose/Throat: Hearing grossly intact,  nares w/o erythema or drainage, poor dentition Eyes: PER, EOMI, sclera nonicteric.  Neck: Supple, no masses.  No bruit or JVD.  Pulmonary:  Good air movement, clear to auscultation bilaterally, no use of accessory muscles.  Cardiac: RRR, normal S1, S2, no Murmurs. Vascular:  Mild venous skinchanges no edema Vessel Right Left  Radial Palpable Palpable  Popliteal Palpable Palpable  PT Palpable Palpable  DP Trace Palpable Palpable  Gastrointestinal: soft, non-distended. No guarding/no peritoneal signs.  Musculoskeletal: M/S 5/5 throughout.  + deformity both knees with scars from prior surgery and mild atrophy of the calves.  Neurologic: CN 2-12 intact. Pain and light touch intact in extremities.  Symmetrical.  Speech is fluent. Motor exam as listed above. Psychiatric: Judgment intact, Mood & affect appropriate for pt's clinical situation. Dermatologic: Mild venous rashes no ulcers noted.  No changes consistent with cellulitis. Lymph : No Cervical lymphadenopathy, no lichenification or skin changes of chronic lymphedema.  CBC Lab Results  Component Value Date   WBC 7.6 10/14/2019   HGB 13.0 10/14/2019   HCT 36.7 (L) 10/14/2019   MCV 109.2 (H) 10/14/2019   PLT 155 10/14/2019    BMET    Component Value Date/Time   NA 132 (L) 10/14/2019 1822   K 3.5 10/14/2019 1822   CL 100 10/14/2019 1822   CO2 20 (L) 10/14/2019 1822   GLUCOSE 91 10/14/2019 1822   BUN 13 10/14/2019 1822   CREATININE 1.36 (H) 10/14/2019 1822   CREATININE 1.91 (H) 05/30/2014 1001   CALCIUM 7.8 (L) 10/14/2019 1822   GFRNONAA 52 (L) 10/14/2019 1822   GFRNONAA 36 (L) 05/30/2014 1001   GFRAA >60 10/14/2019 1822   GFRAA 41 (L) 05/30/2014 1001   CrCl cannot be calculated (Patient's most recent lab result is older than the maximum 21 days allowed.).  COAG No results found for: INR, PROTIME  Radiology CT Head Wo Contrast  Result Date: 10/14/2019 CLINICAL DATA:  Mental status change, syncope EXAM: CT HEAD WITHOUT  CONTRAST TECHNIQUE: Contiguous axial images were obtained from the base of the skull through the vertex without intravenous contrast. COMPARISON:  CT 09/18/2017 FINDINGS: Brain: No evidence of acute infarction, hemorrhage, hydrocephalus, extra-axial collection or mass lesion/mass effect. Symmetric prominence of the ventricles, cisterns and sulci compatible with parenchymal volume loss. Patchy areas of white matter hypoattenuation are most compatible with chronic microvascular angiopathy. Vascular: Atherosclerotic calcification of the carotid siphons. No hyperdense vessel. Skull: Some mild left frontotemporal scalp thickening, could reflect mild contusive change. Correlate for point tenderness. No large hematoma. No subjacent calvarial fracture or acute osseous abnormality. Chronic deformities of the nasal bones. Sinuses/Orbits: Paranasal sinuses and mastoid air cells are predominantly clear.  Included orbital structures are unremarkable. Other: None IMPRESSION: 1. No acute intracranial abnormality. 2. Mild left frontotemporal scalp thickening, could reflect mild contusive change. Correlate for point tenderness. No large hematoma. No subjacent calvarial fracture. 3. Chronic microvascular angiopathy and parenchymal volume loss. Electronically Signed   By: Lovena Le M.D.   On: 10/14/2019 19:24   DG Chest Portable 1 View  Result Date: 10/14/2019 CLINICAL DATA:  74 year old male with syncope. EXAM: PORTABLE CHEST 1 VIEW COMPARISON:  Chest radiograph dated 07/16/2019 and CT dated 07/17/2019. FINDINGS: No focal consolidation, pleural effusion, pneumothorax. The cardiac silhouette is within limits. No acute osseous pathology. Old left rib fracture deformities. IMPRESSION: No active cardiopulmonary disease. Electronically Signed   By: Anner Crete M.D.   On: 10/14/2019 19:08     Assessment/Plan 1. Pain in both lower extremities Recommend:  I do not find evidence of Vascular pathology that would explain the  patient's symptoms  The patient has atypical pain symptoms for vascular disease  I do not find evidence of Vascular pathology that would explain the patient's symptoms and I suspect the patient is c/o pseudoclaudication.  Patient should have an evaluation of his LS spine which I defer to the primary service.  Noninvasive studies including venous ultrasound of the legs do not identify vascular problems  The patient should continue walking and begin a more formal exercise program. The patient should continue his antiplatelet therapy and aggressive treatment of the lipid abnormalities. The patient should begin wearing graduated compression socks 15-20 mmHg strength to control her mild edema.  Patient will follow-up with me on a PRN basis  Further work-up of her lower extremity pain is deferred to the primary service     2. Peripheral vascular disease ([place]) Recommend:  I do not find evidence of Vascular pathology that would explain the patient's symptoms  The patient has atypical pain symptoms for vascular disease  I do not find evidence of Vascular pathology that would explain the patient's symptoms and I suspect the patient is c/o pseudoclaudication.  Patient should have an evaluation of his LS spine which I defer to the primary service.  Noninvasive studies including venous ultrasound of the legs do not identify vascular problems  The patient should continue walking and begin a more formal exercise program. The patient should continue his antiplatelet therapy and aggressive treatment of the lipid abnormalities. The patient should begin wearing graduated compression socks 15-20 mmHg strength to control her mild edema.  Patient will follow-up with me on a PRN basis  Further work-up of her lower extremity pain is deferred to the primary service     3. Essential hypertension Continue antihypertensive medications as already ordered, these medications have been reviewed and there are  no changes at this time.   4. Gastroesophageal reflux disease without esophagitis Continue PPI as already ordered, this medication has been reviewed and there are no changes at this time.  Avoidence of caffeine and alcohol  Moderate elevation of the head of the bed   Hortencia Pilar, MD  11/10/2019 5:00 PM

## 2019-11-11 ENCOUNTER — Ambulatory Visit (INDEPENDENT_AMBULATORY_CARE_PROVIDER_SITE_OTHER): Payer: Medicare Other | Admitting: Vascular Surgery

## 2019-11-11 ENCOUNTER — Encounter (INDEPENDENT_AMBULATORY_CARE_PROVIDER_SITE_OTHER): Payer: Self-pay | Admitting: Vascular Surgery

## 2019-11-11 ENCOUNTER — Other Ambulatory Visit: Payer: Self-pay

## 2019-11-11 VITALS — BP 107/69 | HR 70 | Ht 70.0 in | Wt 190.0 lb

## 2019-11-11 DIAGNOSIS — K219 Gastro-esophageal reflux disease without esophagitis: Secondary | ICD-10-CM | POA: Diagnosis not present

## 2019-11-11 DIAGNOSIS — I10 Essential (primary) hypertension: Secondary | ICD-10-CM | POA: Diagnosis not present

## 2019-11-11 DIAGNOSIS — I739 Peripheral vascular disease, unspecified: Secondary | ICD-10-CM

## 2019-11-11 DIAGNOSIS — M79605 Pain in left leg: Secondary | ICD-10-CM

## 2019-11-11 DIAGNOSIS — M79604 Pain in right leg: Secondary | ICD-10-CM

## 2019-12-10 ENCOUNTER — Encounter: Payer: Self-pay | Admitting: Cardiology

## 2019-12-10 ENCOUNTER — Ambulatory Visit (INDEPENDENT_AMBULATORY_CARE_PROVIDER_SITE_OTHER): Payer: Medicare Other | Admitting: Cardiology

## 2019-12-10 ENCOUNTER — Other Ambulatory Visit: Payer: Self-pay

## 2019-12-10 VITALS — BP 128/82 | HR 78 | Ht 70.0 in | Wt 190.0 lb

## 2019-12-10 DIAGNOSIS — R55 Syncope and collapse: Secondary | ICD-10-CM | POA: Diagnosis not present

## 2019-12-10 DIAGNOSIS — E7800 Pure hypercholesterolemia, unspecified: Secondary | ICD-10-CM

## 2019-12-10 DIAGNOSIS — F1010 Alcohol abuse, uncomplicated: Secondary | ICD-10-CM

## 2019-12-10 DIAGNOSIS — F17200 Nicotine dependence, unspecified, uncomplicated: Secondary | ICD-10-CM
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
## 2019-12-10 NOTE — Patient Instructions (Signed)

## 2019-12-16 ENCOUNTER — Telehealth: Payer: Medicare Other

## 2019-12-16 ENCOUNTER — Encounter: Payer: Self-pay | Admitting: *Deleted

## 2019-12-22 ENCOUNTER — Telehealth (INDEPENDENT_AMBULATORY_CARE_PROVIDER_SITE_OTHER): Payer: Medicare Other | Admitting: Psychiatry

## 2019-12-22 ENCOUNTER — Other Ambulatory Visit: Payer: Self-pay

## 2019-12-22 ENCOUNTER — Encounter: Payer: Self-pay | Admitting: Psychiatry

## 2019-12-22 DIAGNOSIS — F419 Anxiety disorder, unspecified: Secondary | ICD-10-CM | POA: Diagnosis not present

## 2019-12-22 DIAGNOSIS — Z8659 Personal history of other mental and behavioral disorders: Secondary | ICD-10-CM | POA: Insufficient documentation

## 2019-12-22 DIAGNOSIS — F1421 Cocaine dependence, in remission: Secondary | ICD-10-CM

## 2019-12-22 DIAGNOSIS — F1020 Alcohol dependence, uncomplicated: Secondary | ICD-10-CM

## 2019-12-22 DIAGNOSIS — F10980 Alcohol use, unspecified with alcohol-induced anxiety disorder: Secondary | ICD-10-CM
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
## 2019-12-23 ENCOUNTER — Encounter: Payer: Self-pay | Admitting: Psychiatry

## 2020-01-10 ENCOUNTER — Telehealth: Payer: Self-pay

## 2020-01-10 ENCOUNTER — Other Ambulatory Visit: Payer: Self-pay

## 2020-01-10 ENCOUNTER — Telehealth (INDEPENDENT_AMBULATORY_CARE_PROVIDER_SITE_OTHER): Payer: Self-pay | Admitting: Gastroenterology

## 2020-01-10 DIAGNOSIS — Z1211 Encounter for screening for malignant neoplasm of colon: Secondary | ICD-10-CM

## 2020-01-10 MED ORDER — NA SULFATE-K SULFATE-MG SULF 17.5-3.13-1.6 GM/177ML PO SOLN: 1.0000 | Freq: Once | ORAL | 0 refills | Status: AC

## 2020-01-10 NOTE — Progress Notes (Signed)
Gastroenterology Pre-Procedure Review  Request Date: 01/20/20 Requesting Physician: Dr. Allen Norris  PATIENT REVIEW QUESTIONS: The patient responded to the following health history questions as indicated:    1. Are you having any GI issues? yes (diarrhea patient states it has improved) 2. Do you have a personal history of Polyps? no 3. Do you have a family history of Colon Cancer or Polyps? no 4. Diabetes Mellitus? no 5. Joint replacements in the past 12 months?no 6. Major health problems in the past 3 months?yes (10/14/19 Syncopy ER Visit, 07/16/19 Chest Wall Pain) 7. Any artificial heart valves, MVP, or defibrillator?no    MEDICATIONS & ALLERGIES:    Patient reports the following regarding taking any anticoagulation/antiplatelet therapy:   Plavix, Coumadin, Eliquis, Xarelto, Lovenox, Pradaxa, Brilinta, or Effient? no Aspirin? no  Patient confirms/reports the following medications:  Current Outpatient Medications  Medication Sig Dispense Refill   atorvastatin (LIPITOR) 20 MG tablet Take 20 mg by mouth daily.     b complex vitamins tablet Take 1 tablet by mouth daily.     clindamycin (CLEOCIN T) 1 % lotion      clindamycin (CLEOCIN T) 1 % lotion Apply topically.     clotrimazole-betamethasone (LOTRISONE) cream      diclofenac Sodium (VOLTAREN) 1 % GEL Apply topically as needed.     Dolutegravir-Rilpivirine 50-25 MG TABS Take 1 tablet by mouth daily.      doxycycline (VIBRA-TABS) 100 MG tablet Take 100 mg by mouth 2 (two) times daily.     KRILL OIL PO Take by mouth daily.     Omega-3 1000 MG CAPS Take 1 capsule by mouth daily.     PROAIR HFA 108 (90 Base) MCG/ACT inhaler Inhale into the lungs.     Na Sulfate-K Sulfate-Mg Sulf 17.5-3.13-1.6 GM/177ML SOLN Take 1 kit by mouth once for 1 dose. 354 mL 0   No current facility-administered medications for this visit.    Patient confirms/reports the following allergies:  Allergies  Allergen Reactions   Nicotine     Nicotine  patches (mild reaction)    Orders Placed This Encounter  Procedures   Procedural/ Surgical Case Request: COLONOSCOPY WITH PROPOFOL    Standing Status:   Standing    Number of Occurrences:   1    Order Specific Question:   Pre-op diagnosis    Answer:   Screening colonoscopy    Order Specific Question:   CPT Code    Answer:   18841    AUTHORIZATION INFORMATION Primary Insurance: 1D#: Group #:  Secondary Insurance: 1D#: Group #:  SCHEDULE INFORMATION: Date: 01/20/20 Time: Location:[facility]

## 2020-01-10 NOTE — Telephone Encounter (Signed)
Gastroenterology Pre-Procedure Review  Request Date: 01/20/20 Requesting Physician: Dr. Allen Norris  PATIENT REVIEW QUESTIONS: The patient responded to the following health history questions as indicated:    1. Are you having any GI issues? yes (diarrhea but has improved) 2. Do you have a personal history of Polyps? no 3. Do you have a family history of Colon Cancer or Polyps? no 4. Diabetes Mellitus? no 5. Joint replacements in the past 12 months?no 6. Major health problems in the past 3 months?yes (ER Visit  10/14/19 Syncopy, ER Visit 07/16/19 Chest Wall Pain) 7. Any artificial heart valves, MVP, or defibrillator?no    MEDICATIONS & ALLERGIES:    Patient reports the following regarding taking any anticoagulation/antiplatelet therapy:   Plavix, Coumadin, Eliquis, Xarelto, Lovenox, Pradaxa, Brilinta, or Effient? no Aspirin? no  Patient confirms/reports the following medications:  Current Outpatient Medications  Medication Sig Dispense Refill   atorvastatin (LIPITOR) 20 MG tablet Take 20 mg by mouth daily.     b complex vitamins tablet Take 1 tablet by mouth daily.     clindamycin (CLEOCIN T) 1 % lotion      clindamycin (CLEOCIN T) 1 % lotion Apply topically.     clotrimazole-betamethasone (LOTRISONE) cream      diclofenac Sodium (VOLTAREN) 1 % GEL Apply topically as needed.     Dolutegravir-Rilpivirine 50-25 MG TABS Take 1 tablet by mouth daily.      doxycycline (VIBRA-TABS) 100 MG tablet Take 100 mg by mouth 2 (two) times daily.     KRILL OIL PO Take by mouth daily.     Na Sulfate-K Sulfate-Mg Sulf 17.5-3.13-1.6 GM/177ML SOLN Take 1 kit by mouth once for 1 dose. 354 mL 0   Omega-3 1000 MG CAPS Take 1 capsule by mouth daily.     PROAIR HFA 108 (90 Base) MCG/ACT inhaler Inhale into the lungs.     No current facility-administered medications for this visit.    Patient confirms/reports the following allergies:  Allergies  Allergen Reactions   Nicotine     Nicotine  patches (mild reaction)    No orders of the defined types were placed in this encounter.   AUTHORIZATION INFORMATION Primary Insurance: 1D#: Group #:  Secondary Insurance: 1D#: Group #:  SCHEDULE INFORMATION: Date: 01/20/20 Time: Location:[facility]

## 2020-01-13 ENCOUNTER — Telehealth: Payer: Self-pay

## 2020-01-13 NOTE — Telephone Encounter (Signed)
Returned patients call. Unable to leave voicemail. Patient will need to call the Endo unit to receive time of procedure.

## 2020-01-18 ENCOUNTER — Other Ambulatory Visit: Payer: Self-pay

## 2020-01-18 ENCOUNTER — Other Ambulatory Visit
Admission: RE | Admit: 2020-01-18 | Discharge: 2020-01-18 | Disposition: A | Discharge status: Home / Self Care | Payer: Medicare Other | Source: Ambulatory Visit | Attending: Gastroenterology | Admitting: Gastroenterology

## 2020-01-18 DIAGNOSIS — Z20822 Contact with and (suspected) exposure to covid-19: Secondary | ICD-10-CM | POA: Insufficient documentation

## 2020-01-18 DIAGNOSIS — Z01812 Encounter for preprocedural laboratory examination: Secondary | ICD-10-CM | POA: Diagnosis present

## 2020-01-18 LAB — SARS CORONAVIRUS 2 (TAT 6-24 HRS): SARS Coronavirus 2: NEGATIVE

## 2020-01-19 ENCOUNTER — Encounter: Payer: Self-pay | Admitting: Gastroenterology

## 2020-01-20 ENCOUNTER — Ambulatory Visit: Payer: Medicare Other | Admitting: Anesthesiology

## 2020-01-20 ENCOUNTER — Encounter: Payer: Self-pay | Admitting: Gastroenterology

## 2020-01-20 ENCOUNTER — Encounter
Admission: RE | Disposition: A | Discharge status: Home / Self Care | Payer: Self-pay | Source: Home / Self Care | Attending: Gastroenterology

## 2020-01-20 ENCOUNTER — Ambulatory Visit
Admission: RE | Admit: 2020-01-20 | Discharge: 2020-01-20 | Disposition: A | Discharge status: Home / Self Care | Payer: Medicare Other | Attending: Gastroenterology | Admitting: Gastroenterology

## 2020-01-20 ENCOUNTER — Other Ambulatory Visit: Payer: Self-pay

## 2020-01-20 DIAGNOSIS — Z1211 Encounter for screening for malignant neoplasm of colon: Secondary | ICD-10-CM

## 2020-01-20 DIAGNOSIS — K5790 Diverticulosis of intestine, part unspecified, without perforation or abscess without bleeding: Secondary | ICD-10-CM

## 2020-01-20 DIAGNOSIS — F17210 Nicotine dependence, cigarettes, uncomplicated: Secondary | ICD-10-CM | POA: Diagnosis not present

## 2020-01-20 DIAGNOSIS — K5730 Diverticulosis of large intestine without perforation or abscess without bleeding: Secondary | ICD-10-CM | POA: Insufficient documentation

## 2020-01-20 DIAGNOSIS — K648 Other hemorrhoids: Secondary | ICD-10-CM | POA: Diagnosis not present

## 2020-01-20 DIAGNOSIS — Z79899 Other long term (current) drug therapy: Secondary | ICD-10-CM | POA: Insufficient documentation

## 2020-01-20 DIAGNOSIS — Z21 Asymptomatic human immunodeficiency virus [HIV] infection status: Secondary | ICD-10-CM | POA: Insufficient documentation

## 2020-01-20 DIAGNOSIS — Z96653 Presence of artificial knee joint, bilateral: Secondary | ICD-10-CM | POA: Insufficient documentation

## 2020-01-20 HISTORY — PX: COLONOSCOPY WITH PROPOFOL: SHX5780

## 2020-01-20 SURGERY — COLONOSCOPY WITH PROPOFOL
Anesthesia: General

## 2020-01-20 MED ORDER — LIDOCAINE HCL (CARDIAC) PF 100 MG/5ML IV SOSY
PREFILLED_SYRINGE | INTRAVENOUS | Status: DC | PRN
  Administered 2020-01-20: 50 mg via INTRATRACHEAL

## 2020-01-20 MED ORDER — PROPOFOL 10 MG/ML IV BOLUS
INTRAVENOUS | Status: DC | PRN
  Administered 2020-01-20: 70 mg via INTRAVENOUS

## 2020-01-20 MED ORDER — PROPOFOL 500 MG/50ML IV EMUL
INTRAVENOUS | Status: AC
  Filled 2020-01-20: qty 50

## 2020-01-20 MED ORDER — SODIUM CHLORIDE 0.9 % IV SOLN: INTRAVENOUS | Status: DC

## 2020-01-20 MED ORDER — PROPOFOL 500 MG/50ML IV EMUL
INTRAVENOUS | Status: DC | PRN
  Administered 2020-01-20: 125 ug/kg/min via INTRAVENOUS

## 2020-01-20 NOTE — Op Note (Addendum)
[facility] Gastroenterology Patient Name: Anthony Olson Procedure Date: 01/20/2020 8:27 AM MRN: [id number] Account #: [id number] Date of Birth: May 11, 1945 Admit Type: Outpatient Age: 74 Room: [facility] ENDO ROOM 4 Gender: Male Note Status: Supervisor Override Procedure:             Colonoscopy Indications:           Screening for colorectal malignant neoplasm Providers:             [name] MD, MD Referring MD:          Elyse Jarvis Revelo (Referring MD) Medicines:             Propofol per Anesthesia Complications:         No immediate complications. Procedure:             Pre-Anesthesia Assessment:                        - Prior to the procedure, a History and Physical was                         performed, and patient medications and allergies were                         reviewed. The patient's tolerance of previous                         anesthesia was also reviewed. The risks and benefits                         of the procedure and the sedation options and risks                         were discussed with the patient. All questions were                         answered, and informed consent was obtained. Prior                         Anticoagulants: The patient has taken no previous                         anticoagulant or antiplatelet agents. ASA Grade                         Assessment: II - A patient with mild systemic disease.                         After reviewing the risks and benefits, the patient                         was deemed in satisfactory condition to undergo the                         procedure.                        After obtaining informed consent, the colonoscope was  passed under direct vision. Throughout the procedure,                         the patient's blood pressure, pulse, and oxygen                         saturations were monitored continuously. The                         Colonoscope was introduced  through the anus and                         advanced to the the cecum, identified by appendiceal                         orifice and ileocecal valve. The colonoscopy was                         performed without difficulty. The patient tolerated                         the procedure well. The quality of the bowel                         preparation was excellent. Findings:      The perianal and digital rectal examinations were normal.      Multiple small-mouthed diverticula were found in the entire colon.      Non-bleeding internal hemorrhoids were found during retroflexion. The       hemorrhoids were Grade I (internal hemorrhoids that do not prolapse). Impression:            - Diverticulosis in the entire examined colon.                        - Non-bleeding internal hemorrhoids.                        - No specimens collected. Recommendation:        - Discharge patient to home.                        - Resume previous diet.                        - Continue present medications.                        - No repeat colonoscopy due to current age (51 years                         or older). Procedure Code(s):     --- Professional ---                        [phone], Colonoscopy, flexible; diagnostic, including                         collection of specimen(s) by brushing or washing, when  performed (separate procedure) Diagnosis Code(s):     --- Professional ---                        Z12.11, Encounter for screening for malignant neoplasm                         of colon CPT copyright 2019 American Medical Association. All rights reserved. The codes documented in this report are preliminary and upon coder review may  be revised to meet current compliance requirements. [name] MD, MD 01/20/2020 8:49:57 AM This report has been signed electronically. Number of Addenda: 0 Note Initiated On: 01/20/2020 8:27 AM Scope Withdrawal Time: 0 hours 6 minutes 55 seconds   Total Procedure Duration: 0 hours 11 minutes 25 seconds  Estimated Blood Loss:  Estimated blood loss: none.      [facility]

## 2020-01-20 NOTE — H&P (Signed)
Anthony Lame, MD [address] [place], [place] [address] Phone: [phone] Fax : [phone]  Primary Care Physician:  Theotis Burrow, MD Primary Gastroenterologist:  Dr. Allen Norris  Pre-Procedure History & Physical: HPI:  Anthony Olson. is a 74 y.o. male is here for a screening colonoscopy.   Past Medical History:  Diagnosis Date  . Alcohol abuse    self-reported  . Anxiety   . Hernia, abdominal   . HIV infection (HCC)    Sees Dr. Ola Spurr for this  . HTN (hypertension)   . Inguinal hernia   . Kidney failure   . Neoplasm of skin     Past Surgical History:  Procedure Laterality Date  . CARPAL TUNNEL RELEASE Left   . HERNIA REPAIR  1997   Left Lower Abdomen- [place]  . HERNIA REPAIR  2263   [place]  . INGUINAL HERNIA REPAIR Left 1996   [place]  . INGUINAL HERNIA REPAIR Right 1993   [place]  . JOINT REPLACEMENT    . LAMINECTOMY    . OPEN ANTERIOR SHOULDER RECONSTRUCTION Right 08/2013   [facility]  . REPLACEMENT TOTAL KNEE Right   . REPLACEMENT TOTAL KNEE Left    [facility]  . UMBILICAL HERNIA REPAIR  1997   [place]    Prior to Admission medications   Medication Sig Start Date End Date Taking? Authorizing Provider  atorvastatin (LIPITOR) 20 MG tablet Take 30 mg by mouth daily. Takes one 20 mg and one 10 mg tablet. 07/27/19  Yes [provider]  b complex vitamins tablet Take 1 tablet by mouth daily.   Yes [provider]  busPIRone (BUSPAR) 10 MG tablet Take 10 mg by mouth 2 (two) times daily.   Yes [provider]  Dolutegravir-Rilpivirine 50-25 MG TABS Take 1 tablet by mouth daily.    Yes [provider]  escitalopram (LEXAPRO) 20 MG tablet Take 30 mg by mouth daily. Takes one 10 mg and 20 mg tablets a day.   Yes [provider]  KRILL OIL PO Take by mouth daily.   Yes [provider]  Omega-3 1000 MG CAPS Take 1 capsule by mouth daily.   Yes [provider]  PROAIR HFA 108 [phone] Base) MCG/ACT inhaler Inhale into the lungs. 08/23/19  Yes [provider]  clindamycin (CLEOCIN T) 1 % lotion  12/20/19   [provider]  clindamycin (CLEOCIN T) 1 % lotion Apply topically. 12/20/19 12/19/20  [provider]  clotrimazole-betamethasone (LOTRISONE) cream  08/26/19   [provider]  diclofenac Sodium (VOLTAREN) 1 % GEL Apply topically as needed. Patient not taking: Reported on 01/20/2020    [provider]  doxycycline (VIBRA-TABS) 100 MG tablet Take 100 mg by mouth 2 (two) times daily. 12/20/19   [provider]    Allergies as of 01/10/2020 - Review Complete 12/22/2019  Allergen Reaction Noted  . Nicotine  10/28/2019    Family History  Problem Relation Age of Onset  . Diabetes Mother   . CAD Father 90  . Schizophrenia Grandson     Social History   Socioeconomic History  . Marital status: Divorced    Spouse name: Not on file  . Number of children: 1  . Years of education: Not on file  . Highest education level: Not on file  Occupational History  . Occupation: retired  Tobacco Use  . Smoking status: Current Every Day Smoker    Types:  Cigarettes  . Smokeless tobacco: Never Used  . Tobacco comment: 10 cigarettes per day  Vaping Use  . Vaping Use: Never used  Substance and Sexual Activity  . Alcohol use: Yes    Comment: 6 pack beer  per day the past   . Drug use: Not Currently    Types: Cocaine    Comment: Use to do cocaine 30 years ago  . Sexual activity: Not on file  Other Topics Concern  . Not on file  Social History Narrative  . Not on file   Social Determinants of Health   Financial Resource Strain:   . Difficulty of Paying Living Expenses: Not on file  Food Insecurity:   . Worried About Charity fundraiser in the Last Year: Not on file  . Ran Out of Food in the Last Year: Not on file  Transportation Needs:   . Lack of Transportation (Medical): Not on  file  . Lack of Transportation (Non-Medical): Not on file  Physical Activity:   . Days of Exercise per Week: Not on file  . Minutes of Exercise per Session: Not on file  Stress:   . Feeling of Stress : Not on file  Social Connections:   . Frequency of Communication with Friends and Family: Not on file  . Frequency of Social Gatherings with Friends and Family: Not on file  . Attends Religious Services: Not on file  . Active Member of Clubs or Organizations: Not on file  . Attends Archivist Meetings: Not on file  . Marital Status: Not on file  Intimate Partner Violence:   . Fear of Current or Ex-Partner: Not on file  . Emotionally Abused: Not on file  . Physically Abused: Not on file  . Sexually Abused: Not on file    Review of Systems: See HPI, otherwise negative ROS  Physical Exam: BP (!) 158/97   Pulse 73   Temp (!) 97.1 F (36.2 C) (Temporal)   Resp 18   Ht 5' 9.5" (1.765 m)   Wt 88.5 kg   SpO2 99%   BMI 28.38 kg/m  General:   Alert,  pleasant and cooperative in NAD Head:  Normocephalic and atraumatic. Neck:  Supple; no masses or thyromegaly. Lungs:  Clear throughout to auscultation.    Heart:  Regular rate and rhythm. Abdomen:  Soft, nontender and nondistended. Normal bowel sounds, without guarding, and without rebound.   Neurologic:  Alert and  oriented x4;  grossly normal neurologically.  Impression/Plan: Anthony Must. is now here to undergo a screening colonoscopy.  Risks, benefits, and alternatives regarding colonoscopy have been reviewed with the patient.  Questions have been answered.  All parties agreeable.

## 2020-01-20 NOTE — Anesthesia Preprocedure Evaluation (Signed)
Anesthesia Evaluation  Patient identified by MRN, date of birth, ID band Patient awake    Reviewed: Allergy & Precautions, H&P , NPO status , Patient's Chart, lab work & pertinent test results  History of Anesthesia Complications Negative for: history of anesthetic complications  Airway Mallampati: II  TM Distance: >3 FB     Dental  (+) Missing   Pulmonary neg sleep apnea, neg COPD, Current Smoker,    breath sounds clear to auscultation       Cardiovascular hypertension, (-) angina+ Peripheral Vascular Disease  (-) Past MI and (-) Cardiac Stents (-) dysrhythmias  Rhythm:regular Rate:Normal     Neuro/Psych PSYCHIATRIC DISORDERS Anxiety Depression negative neurological ROS  negative psych ROS   GI/Hepatic GERD  Controlled,(+)     substance abuse  alcohol use,   Endo/Other  negative endocrine ROS  Renal/GU negative Renal ROS  negative genitourinary   Musculoskeletal   Abdominal   Peds  Hematology negative hematology ROS (+)   Anesthesia Other Findings Past Medical History: No date: Alcohol abuse     Comment:  self-reported No date: Anxiety No date: Hernia, abdominal No date: HIV infection ([place])     Comment:  Sees Dr. Ola Spurr for this No date: HTN (hypertension) No date: Inguinal hernia No date: Kidney failure No date: Neoplasm of skin  Past Surgical History: No date: CARPAL TUNNEL RELEASE; Left 1997: HERNIA REPAIR     Comment:  Left Lower Abdomen- [place] 2005: HERNIA REPAIR     Comment:  Supraumbilical- [place] [address]: [place]; Left     Comment:  [place] 1993: INGUINAL HERNIA REPAIR; Right     Comment:  [place] No date: JOINT REPLACEMENT No date: LAMINECTOMY 08/2013: OPEN ANTERIOR SHOULDER RECONSTRUCTION; Right     Comment:  [facility] No date: REPLACEMENT TOTAL KNEE; Right No date: REPLACEMENT TOTAL KNEE; Left     Comment:  [facility]841: UMBILICAL HERNIA REPAIR     Comment:   [place]  BMI    Body Mass Index: 28.38 kg/m      Reproductive/Obstetrics negative OB ROS                            Anesthesia Physical Anesthesia Plan  ASA: III  Anesthesia Plan: General   Post-op Pain Management:    Induction:   PONV Risk Score and Plan: Propofol infusion and TIVA  Airway Management Planned: Nasal Cannula  Additional Equipment:   Intra-op Plan:   Post-operative Plan:   Informed Consent: I have reviewed the patients History and Physical, chart, labs and discussed the procedure including the risks, benefits and alternatives for the proposed anesthesia with the patient or authorized representative who has indicated his/her understanding and acceptance.     Dental Advisory Given  Plan Discussed with: Anesthesiologist, CRNA and Surgeon  Anesthesia Plan Comments:         Anesthesia Quick Evaluation

## 2020-01-20 NOTE — Transfer of Care (Signed)
Immediate Anesthesia Transfer of Care Note  Patient: Anthony Olson.  Procedure(s) Performed: COLONOSCOPY WITH PROPOFOL (N/A )  Patient Location: PACU  Anesthesia Type:MAC  Level of Consciousness: awake and drowsy  Airway & Oxygen Therapy: Patient Spontanous Breathing  Post-op Assessment: Report given to RN and Post -op Vital signs reviewed and stable  Post vital signs: stable  Last Vitals:  Vitals Value Taken Time  BP 96/65 01/20/20 0852  Temp    Pulse 69 01/20/20 0852  Resp 15 01/20/20 0852  SpO2 95 % 01/20/20 0852  Vitals shown include unvalidated device data.  Last Pain:  Vitals:   01/20/20 0733  TempSrc: Temporal  PainSc: 0-No pain         Complications: No complications documented.

## 2020-01-21 ENCOUNTER — Encounter: Payer: Self-pay | Admitting: Gastroenterology

## 2020-01-21 NOTE — Anesthesia Postprocedure Evaluation (Signed)
Anesthesia Post Note  Patient: Azaryah Oleksy.  Procedure(s) Performed: COLONOSCOPY WITH PROPOFOL (N/A )  Patient location during evaluation: PACU Anesthesia Type: General Level of consciousness: awake and alert Pain management: pain level controlled Vital Signs Assessment: post-procedure vital signs reviewed and stable Respiratory status: spontaneous breathing, nonlabored ventilation and respiratory function stable Cardiovascular status: blood pressure returned to baseline and stable Postop Assessment: no apparent nausea or vomiting Anesthetic complications: no   No complications documented.   Last Vitals:  Vitals:   01/20/20 0930 01/20/20 0932  BP: (!) 160/92 (!) 160/92  Pulse: 62 62  Resp: 14 14  Temp:    SpO2: 99% 99%    Last Pain:  Vitals:   01/20/20 0733  TempSrc: Temporal  PainSc: 0-No pain                 Brett Canales Jomo Forand

## 2020-06-27 IMAGING — CT CT CERVICAL SPINE W/O CM
5 of 11 series · 10 of 33 positions shown, 11 images · non-contrast
Comparison: MR cervical spine dated October 03, 2014.

CLINICAL DATA: Fall face first at home.

EXAM:
CT HEAD WITHOUT CONTRAST
CT MAXILLOFACIAL WITHOUT CONTRAST
CT CERVICAL SPINE WITHOUT CONTRAST
TECHNIQUE: Multidetector CT imaging of the head, cervical spine, and
maxillofacial structures were performed using the standard protocol
without intravenous contrast. Multiplanar CT image reconstructions
of the cervical spine and maxillofacial structures were also
generated.

[Series 8: c spine soft · axial · 0.32mm/px · z∈[-233,-175]mm · 2 of 89 slices shown]
[im 30/89  soft-tissue]
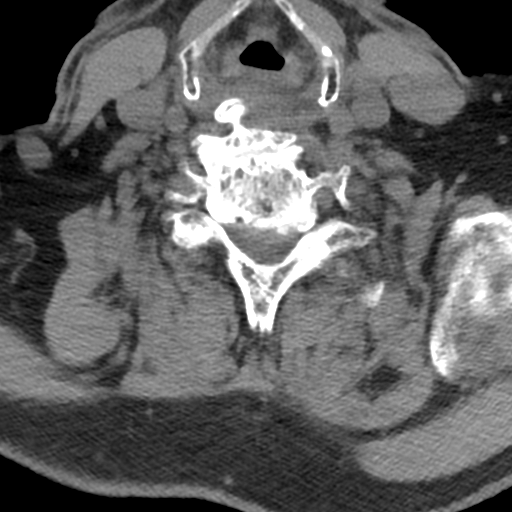
[im 59/89  soft-tissue]
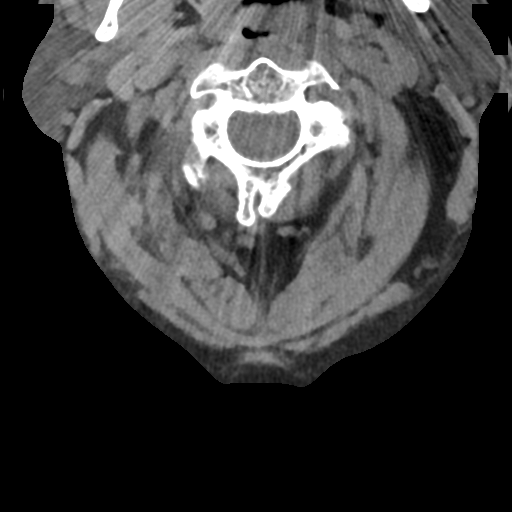

[Series 12: orthogonal axials · axial · 0.22mm/px · z∈[-289,-198]mm · 3 of 103 slices shown, 4 images]
[im 26/103  soft-tissue]
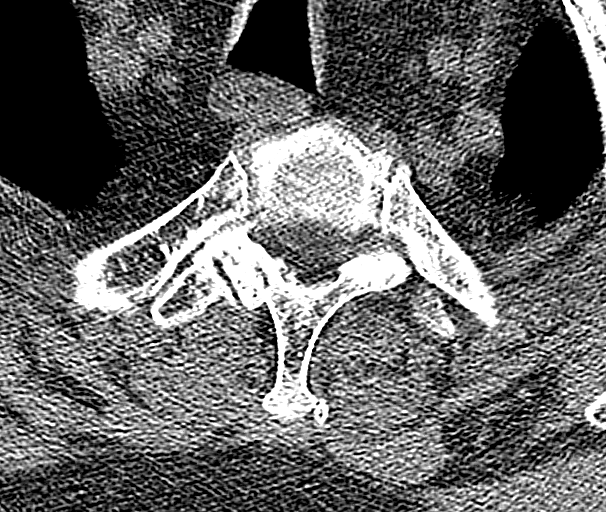
[im 26/103  bone]
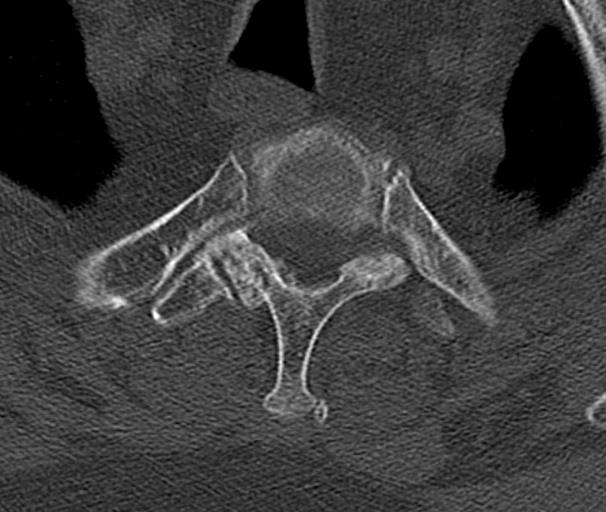
[im 52/103  bone]
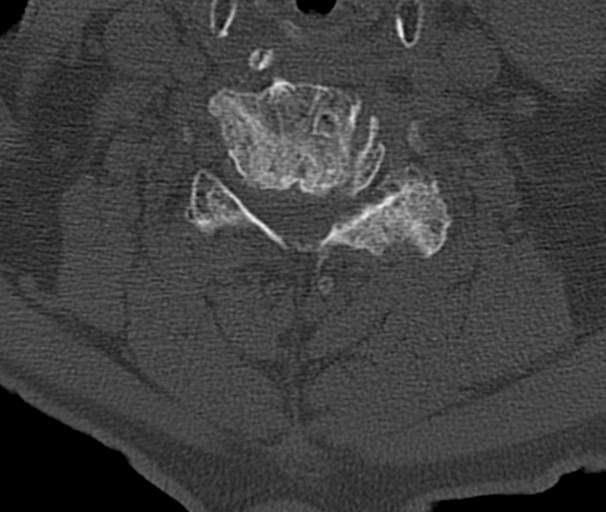
[im 77/103  bone]
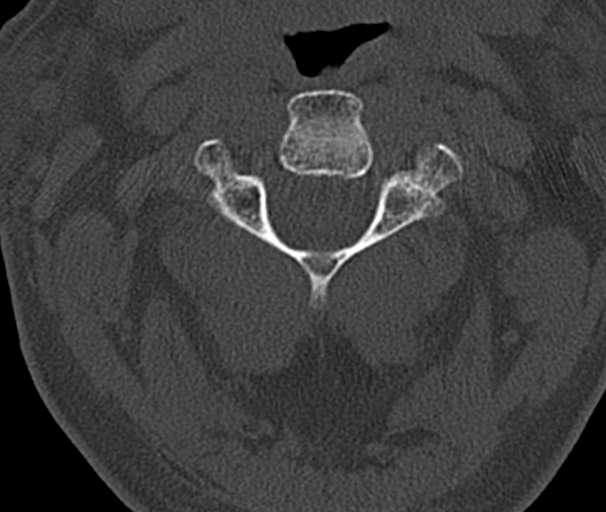

[Series 13: max soft · axial · 0.38mm/px · z∈[-175,-113]mm · 2 of 95 slices shown]
[im 32/95  soft-tissue]
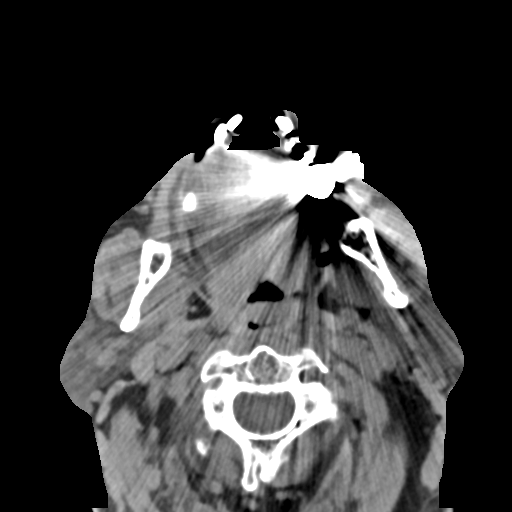
[im 63/95  soft-tissue]
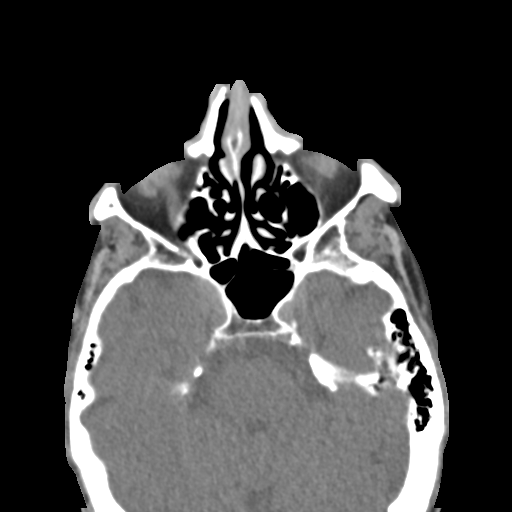

[Series 16: coronal soft · coronal · 0.39mm/px · 1 of 95 slices shown]
[im 48/95  bone]
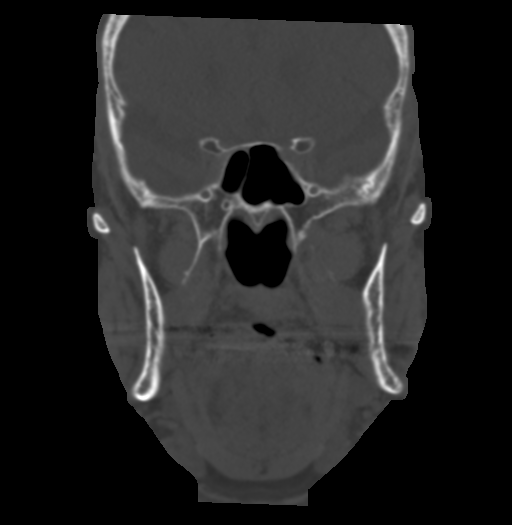

[Series 19: sagittal bone · sagittal · 0.42mm/px · 2 of 79 slices shown]
[im 27/79  bone]
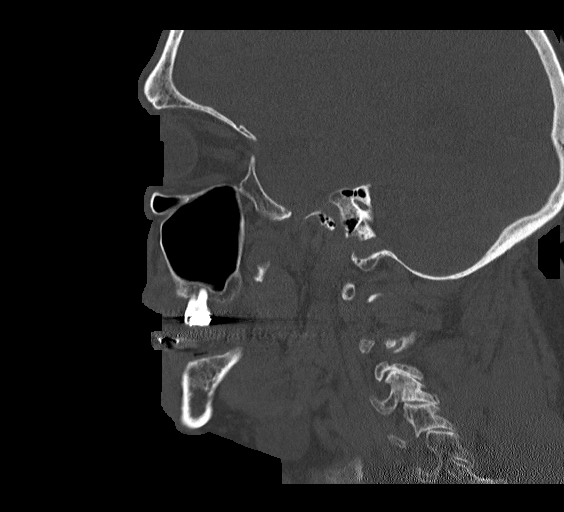
[im 53/79  bone]
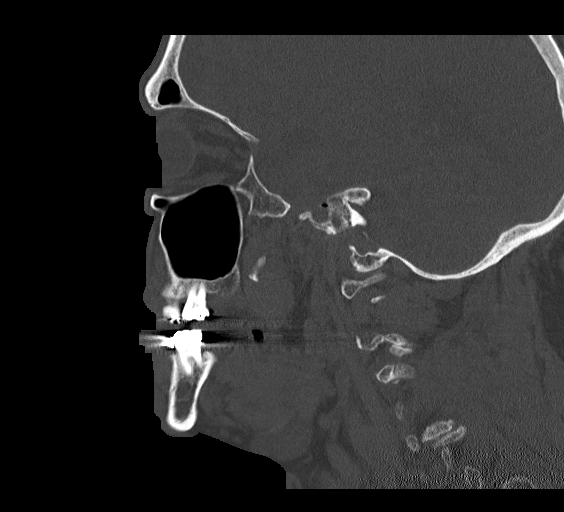

[10 of 33 positions shown; findings below may reference images not displayed]

FINDINGS: CT HEAD FINDINGS

Brain: No evidence of acute infarction, hemorrhage, hydrocephalus,
extra-axial collection or mass lesion/mass effect. Mild generalized
cerebral atrophy. Scattered mild periventricular and subcortical
white matter hypodensities are nonspecific, but favored to reflect
chronic microvascular ischemic changes.

Vascular: Calcified atherosclerosis at the skullbase. No hyperdense
vessel. Dolichoectasia of the proximal basilar artery.

Skull: Negative for fracture or focal lesion.

Other: None.

CT MAXILLOFACIAL FINDINGS

Osseous: Minimally depressed fracture of the nasal bone. Fracture of
the right maxillary central incisor root. Absent left maxillary
central incisor.

Orbits: Negative. No traumatic or inflammatory finding.

Sinuses: Clear.

Soft tissues: Nasal soft tissue swelling.

CT CERVICAL SPINE FINDINGS

Alignment: Unchanged degenerative 2 mm anterolisthesis at C5-C6. No
traumatic malalignment.

Skull base and vertebrae: No acute fracture. No primary bone lesion
or focal pathologic process.

Soft tissues and spinal canal: No prevertebral fluid or swelling. No
visible canal hematoma.

Disc levels: Severe multilevel degenerative changes throughout the
cervical spine.

Upper chest: Negative.

Other: None.
IMPRESSION: 1. Minimally depressed fracture of the nasal bone with overlying
soft tissue swelling.
2. Fracture of the right maxillary central incisor root. Absent left
maxillary central incisor.
3. No acute intracranial abnormality. Atrophy and chronic
microvascular ischemic changes.
4. No acute cervical spine fracture. Severe multilevel degenerative
changes.

## 2020-08-31 ENCOUNTER — Other Ambulatory Visit: Payer: Self-pay

## 2020-08-31 DIAGNOSIS — D472 Monoclonal gammopathy: Secondary | ICD-10-CM

## 2020-08-31 DIAGNOSIS — D696 Thrombocytopenia, unspecified: Secondary | ICD-10-CM

## 2020-09-01 ENCOUNTER — Inpatient Hospital Stay: Payer: Medicare Other | Attending: Nurse Practitioner

## 2020-09-01 ENCOUNTER — Inpatient Hospital Stay: Payer: Medicare Other | Admitting: Nurse Practitioner

## 2020-09-26 ENCOUNTER — Other Ambulatory Visit: Payer: Self-pay

## 2020-09-26 ENCOUNTER — Inpatient Hospital Stay
Admission: EM | Admit: 2020-09-26 | Discharge: 2020-09-28 | DRG: 728 | Disposition: A | Discharge status: Home Health | Payer: Medicare Other | Attending: Internal Medicine | Admitting: Internal Medicine

## 2020-09-26 ENCOUNTER — Emergency Department: Payer: Medicare Other

## 2020-09-26 ENCOUNTER — Encounter: Payer: Self-pay | Admitting: Intensive Care

## 2020-09-26 DIAGNOSIS — Z6826 Body mass index (BMI) 26.0-26.9, adult: Secondary | ICD-10-CM | POA: Diagnosis not present

## 2020-09-26 DIAGNOSIS — Z20822 Contact with and (suspected) exposure to covid-19: Secondary | ICD-10-CM | POA: Diagnosis present

## 2020-09-26 DIAGNOSIS — N492 Inflammatory disorders of scrotum: Secondary | ICD-10-CM | POA: Diagnosis not present

## 2020-09-26 DIAGNOSIS — E440 Moderate protein-calorie malnutrition: Secondary | ICD-10-CM | POA: Diagnosis present

## 2020-09-26 DIAGNOSIS — F419 Anxiety disorder, unspecified: Secondary | ICD-10-CM | POA: Diagnosis present

## 2020-09-26 DIAGNOSIS — F1020 Alcohol dependence, uncomplicated: Secondary | ICD-10-CM | POA: Diagnosis present

## 2020-09-26 DIAGNOSIS — B20 Human immunodeficiency virus [HIV] disease: Secondary | ICD-10-CM | POA: Diagnosis present

## 2020-09-26 DIAGNOSIS — Z96653 Presence of artificial knee joint, bilateral: Secondary | ICD-10-CM | POA: Diagnosis present

## 2020-09-26 DIAGNOSIS — Z21 Asymptomatic human immunodeficiency virus [HIV] infection status: Secondary | ICD-10-CM | POA: Diagnosis present

## 2020-09-26 DIAGNOSIS — F17210 Nicotine dependence, cigarettes, uncomplicated: Secondary | ICD-10-CM | POA: Diagnosis present

## 2020-09-26 DIAGNOSIS — Z85828 Personal history of other malignant neoplasm of skin: Secondary | ICD-10-CM

## 2020-09-26 DIAGNOSIS — R296 Repeated falls: Secondary | ICD-10-CM | POA: Diagnosis present

## 2020-09-26 DIAGNOSIS — Z818 Family history of other mental and behavioral disorders: Secondary | ICD-10-CM

## 2020-09-26 DIAGNOSIS — N1831 Chronic kidney disease, stage 3a: Secondary | ICD-10-CM | POA: Diagnosis present

## 2020-09-26 DIAGNOSIS — Z79899 Other long term (current) drug therapy: Secondary | ICD-10-CM

## 2020-09-26 DIAGNOSIS — F17200 Nicotine dependence, unspecified, uncomplicated: Secondary | ICD-10-CM | POA: Diagnosis present

## 2020-09-26 DIAGNOSIS — I129 Hypertensive chronic kidney disease with stage 1 through stage 4 chronic kidney disease, or unspecified chronic kidney disease: Secondary | ICD-10-CM | POA: Diagnosis present

## 2020-09-26 DIAGNOSIS — F32A Depression, unspecified: Secondary | ICD-10-CM | POA: Diagnosis present

## 2020-09-26 DIAGNOSIS — E871 Hypo-osmolality and hyponatremia: Secondary | ICD-10-CM | POA: Diagnosis present

## 2020-09-26 DIAGNOSIS — I10 Essential (primary) hypertension: Secondary | ICD-10-CM | POA: Diagnosis present

## 2020-09-26 DIAGNOSIS — N50811 Right testicular pain: Secondary | ICD-10-CM

## 2020-09-26 LAB — CBC WITH DIFFERENTIAL/PLATELET
Abs Immature Granulocytes: 0.11 10*3/uL — ABNORMAL HIGH (ref 0.00–0.07)
Basophils Absolute: 0.1 10*3/uL (ref 0.0–0.1)
Basophils Relative: 1 %
Eosinophils Absolute: 0 10*3/uL (ref 0.0–0.5)
Eosinophils Relative: 0 %
HCT: 42 % (ref 39.0–52.0)
Hemoglobin: 15.1 g/dL (ref 13.0–17.0)
Immature Granulocytes: 1 %
Lymphocytes Relative: 24 %
Lymphs Abs: 2.4 10*3/uL (ref 0.7–4.0)
MCH: 36.6 pg — ABNORMAL HIGH (ref 26.0–34.0)
MCHC: 36 g/dL (ref 30.0–36.0)
MCV: 101.7 fL — ABNORMAL HIGH (ref 80.0–100.0)
Monocytes Absolute: 1 10*3/uL (ref 0.1–1.0)
Monocytes Relative: 9 %
Neutro Abs: 6.5 10*3/uL (ref 1.7–7.7)
Neutrophils Relative %: 65 %
Platelets: 159 10*3/uL (ref 150–400)
RBC: 4.13 MIL/uL — ABNORMAL LOW (ref 4.22–5.81)
RDW: 12.6 % (ref 11.5–15.5)
WBC: 10.1 10*3/uL (ref 4.0–10.5)
nRBC: 0 % (ref 0.0–0.2)

## 2020-09-26 LAB — COMPREHENSIVE METABOLIC PANEL
ALT: 16 U/L (ref 0–44)
AST: 20 U/L (ref 15–41)
Albumin: 3.8 g/dL (ref 3.5–5.0)
Alkaline Phosphatase: 104 U/L (ref 38–126)
Anion gap: 11 (ref 5–15)
BUN: 11 mg/dL (ref 8–23)
CO2: 23 mmol/L (ref 22–32)
Calcium: 8.6 mg/dL — ABNORMAL LOW (ref 8.9–10.3)
Chloride: 100 mmol/L (ref 98–111)
Creatinine, Ser: 1.42 mg/dL — ABNORMAL HIGH (ref 0.61–1.24)
GFR, Estimated: 53 mL/min — ABNORMAL LOW (ref >60)
Glucose, Bld: 109 mg/dL — ABNORMAL HIGH (ref 70–99)
Potassium: 3.8 mmol/L (ref 3.5–5.1)
Sodium: 134 mmol/L — ABNORMAL LOW (ref 135–145)
Total Bilirubin: 0.9 mg/dL (ref 0.3–1.2)
Total Protein: 6.9 g/dL (ref 6.5–8.1)

## 2020-09-26 LAB — LACTIC ACID, PLASMA: Lactic Acid, Venous: 2.1 mmol/L (ref 0.5–1.9)

## 2020-09-26 LAB — RESP PANEL BY RT-PCR (FLU A&B, COVID) ARPGX2
Influenza A by PCR: NEGATIVE
Influenza B by PCR: NEGATIVE
SARS Coronavirus 2 by RT PCR: NEGATIVE

## 2020-09-26 LAB — MAGNESIUM: Magnesium: 2.3 mg/dL (ref 1.7–2.4)

## 2020-09-26 LAB — PHOSPHORUS: Phosphorus: 3.3 mg/dL (ref 2.5–4.6)

## 2020-09-26 MED ORDER — ONDANSETRON HCL 4 MG PO TABS: 4.0000 mg | ORAL_TABLET | Freq: Four times a day (QID) | ORAL | Status: DC | PRN

## 2020-09-26 MED ORDER — LORAZEPAM 2 MG/ML IJ SOLN: 1.0000 mg | INTRAMUSCULAR | Status: DC | PRN

## 2020-09-26 MED ORDER — MORPHINE SULFATE (PF) 4 MG/ML IV SOLN
4.0000 mg | Freq: Once | INTRAVENOUS | Status: AC
  Administered 2020-09-26: 4 mg via INTRAVENOUS
  Filled 2020-09-26: qty 1

## 2020-09-26 MED ORDER — THIAMINE HCL 100 MG PO TABS
100.0000 mg | ORAL_TABLET | Freq: Every day | ORAL | Status: DC
  Administered 2020-09-26 – 2020-09-28 (×3): 100 mg via ORAL
  Filled 2020-09-26 (×3): qty 1

## 2020-09-26 MED ORDER — ONDANSETRON 4 MG PO TBDP
ORAL_TABLET | ORAL | Status: AC
  Filled 2020-09-26: qty 1

## 2020-09-26 MED ORDER — ATORVASTATIN CALCIUM 20 MG PO TABS
40.0000 mg | ORAL_TABLET | Freq: Every day | ORAL | Status: DC
  Administered 2020-09-26 – 2020-09-27 (×2): 40 mg via ORAL
  Filled 2020-09-26 (×2): qty 2

## 2020-09-26 MED ORDER — SODIUM CHLORIDE 0.9 % IV SOLN
INTRAVENOUS | Status: DC
Start: 2020-09-26 — End: 2020-09-28

## 2020-09-26 MED ORDER — DOLUTEGRAVIR-RILPIVIRINE 50-25 MG PO TABS: 1.0000 | ORAL_TABLET | Freq: Every day | ORAL | Status: DC

## 2020-09-26 MED ORDER — ALBUTEROL SULFATE HFA 108 (90 BASE) MCG/ACT IN AERS: 2.0000 | INHALATION_SPRAY | Freq: Four times a day (QID) | RESPIRATORY_TRACT | Status: DC | PRN

## 2020-09-26 MED ORDER — ALBUTEROL SULFATE (2.5 MG/3ML) 0.083% IN NEBU: 2.5000 mg | INHALATION_SOLUTION | Freq: Four times a day (QID) | RESPIRATORY_TRACT | Status: DC | PRN

## 2020-09-26 MED ORDER — PIPERACILLIN-TAZOBACTAM 3.375 G IVPB
3.3750 g | Freq: Once | INTRAVENOUS | Status: AC
  Administered 2020-09-26: 3.375 g via INTRAVENOUS
  Filled 2020-09-26: qty 50

## 2020-09-26 MED ORDER — PIPERACILLIN-TAZOBACTAM 3.375 G IVPB
3.3750 g | Freq: Three times a day (TID) | INTRAVENOUS | Status: DC
  Administered 2020-09-26 – 2020-09-28 (×5): 3.375 g via INTRAVENOUS
  Filled 2020-09-26 (×4): qty 50

## 2020-09-26 MED ORDER — SODIUM CHLORIDE 0.9 % IV BOLUS
1000.0000 mL | Freq: Once | INTRAVENOUS | Status: AC
  Administered 2020-09-26: 1000 mL via INTRAVENOUS

## 2020-09-26 MED ORDER — BUSPIRONE HCL 10 MG PO TABS
10.0000 mg | ORAL_TABLET | Freq: Two times a day (BID) | ORAL | Status: DC
  Administered 2020-09-26 – 2020-09-28 (×4): 10 mg via ORAL
  Filled 2020-09-26 (×5): qty 1

## 2020-09-26 MED ORDER — MORPHINE SULFATE (PF) 2 MG/ML IV SOLN
2.0000 mg | Freq: Once | INTRAVENOUS | Status: AC
Start: 2020-09-26 — End: 2020-09-26
  Administered 2020-09-26: 2 mg via INTRAVENOUS
  Filled 2020-09-26: qty 1

## 2020-09-26 MED ORDER — ONDANSETRON HCL 4 MG/2ML IJ SOLN: 4.0000 mg | Freq: Four times a day (QID) | INTRAMUSCULAR | Status: DC | PRN

## 2020-09-26 MED ORDER — LISINOPRIL 5 MG PO TABS
2.5000 mg | ORAL_TABLET | Freq: Every day | ORAL | Status: DC
  Administered 2020-09-27 – 2020-09-28 (×2): 2.5 mg via ORAL
  Filled 2020-09-26 (×2): qty 1

## 2020-09-26 MED ORDER — ONDANSETRON 4 MG PO TBDP: 4.0000 mg | ORAL_TABLET | Freq: Once | ORAL | Status: DC

## 2020-09-26 MED ORDER — LORAZEPAM 1 MG PO TABS
0.0000 mg | ORAL_TABLET | Freq: Four times a day (QID) | ORAL | Status: AC
  Administered 2020-09-26 – 2020-09-28 (×5): 1 mg via ORAL
  Filled 2020-09-26 (×5): qty 1

## 2020-09-26 MED ORDER — MORPHINE SULFATE (PF) 2 MG/ML IV SOLN
2.0000 mg | INTRAVENOUS | Status: DC | PRN
  Administered 2020-09-26 – 2020-09-28 (×3): 2 mg via INTRAVENOUS
  Filled 2020-09-26 (×3): qty 1

## 2020-09-26 MED ORDER — ADULT MULTIVITAMIN W/MINERALS CH
1.0000 | ORAL_TABLET | Freq: Every day | ORAL | Status: DC
  Administered 2020-09-27 (×2): 1 via ORAL
  Filled 2020-09-26 (×2): qty 1

## 2020-09-26 MED ORDER — LORAZEPAM 1 MG PO TABS: 1.0000 mg | ORAL_TABLET | ORAL | Status: DC | PRN

## 2020-09-26 MED ORDER — OMEGA-3-ACID ETHYL ESTERS 1 G PO CAPS
1.0000 | ORAL_CAPSULE | Freq: Every day | ORAL | Status: DC
  Administered 2020-09-27 – 2020-09-28 (×2): 1 g via ORAL
  Filled 2020-09-26 (×2): qty 1

## 2020-09-26 MED ORDER — ENOXAPARIN SODIUM 40 MG/0.4ML IJ SOSY
40.0000 mg | PREFILLED_SYRINGE | INTRAMUSCULAR | Status: DC
  Administered 2020-09-26 – 2020-09-27 (×2): 40 mg via SUBCUTANEOUS
  Filled 2020-09-26 (×2): qty 0.4

## 2020-09-26 MED ORDER — ESCITALOPRAM OXALATE 20 MG PO TABS
30.0000 mg | ORAL_TABLET | Freq: Every day | ORAL | Status: DC
  Administered 2020-09-27 – 2020-09-28 (×2): 30 mg via ORAL
  Filled 2020-09-26 (×2): qty 1

## 2020-09-26 MED ORDER — FOLIC ACID 1 MG PO TABS
1.0000 mg | ORAL_TABLET | Freq: Every day | ORAL | Status: DC
  Administered 2020-09-26 – 2020-09-28 (×3): 1 mg via ORAL
  Filled 2020-09-26 (×3): qty 1

## 2020-09-26 MED ORDER — B COMPLEX PO TABS: 1.0000 | ORAL_TABLET | Freq: Every day | ORAL | Status: DC

## 2020-09-26 MED ORDER — LORAZEPAM 1 MG PO TABS
0.0000 mg | ORAL_TABLET | Freq: Two times a day (BID) | ORAL | Status: DC
  Administered 2020-09-28: 1 mg via ORAL
  Filled 2020-09-26: qty 1

## 2020-09-26 MED ORDER — THIAMINE HCL 100 MG/ML IJ SOLN
100.0000 mg | Freq: Every day | INTRAMUSCULAR | Status: DC
  Filled 2020-09-26 (×2): qty 2

## 2020-09-26 NOTE — ED Provider Notes (Signed)
[facility] Emergency Department Provider Note   ____________________________________________   Event Date/Time   First MD Initiated Contact with Patient 09/26/20 1047     (approximate)  I have reviewed the triage vital signs and the nursing notes.   HISTORY  Chief Complaint Testicle Pain    HPI Anthony Olson. is a 75 y.o. male 1 week of increasing pain around his scrotum.  Redness pain discomfort.  History of HSV, hypertension, chronic kidney disease  Patient also reports alcohol abuse.  Drinks 6 beers this morning to help quell the pain but does drink daily as well  Has had similar issues in the past.   Pain is severe 10 out of 10 with any touching of the scrotum.  Does not seem to extend to his buttock that he is aware of but he cannot see the area well  Some nausea but no vomiting.  No noted fever.  Denies abdominal pain.  Feels fatigued however.  Believe infection is worsening quite a bit over the last couple days  Reports compliance and follows with infectious disease for HIV therapy  Past Medical History:  Diagnosis Date   Alcohol abuse    self-reported   Anxiety    Hernia, abdominal    HIV infection ([place])    Sees Dr. 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[place]) 12/22/2019   History of posttraumatic stress disorder (PTSD) 12/22/2019   Cocaine use disorder, moderate, in sustained remission ([place]) 12/22/2019   Leg pain 11/10/2019   Unilateral recurrent inguinal hernia without obstruction or gangrene 10/02/2017   Syncope 09/18/2017   Monoclonal gammopathy of unknown significance (MGUS) 07/19/2016   Thrombocytopenia ([place]) 07/19/2016   Macrocytosis 03/20/2015   Kidney disease  03/20/2015   Peripheral vascular disease ([place]) 03/20/2015   Hypertensive disorder 03/20/2015   Peripheral neuritis 03/20/2015   Seborrheic dermatitis [id number]   Urinary complication, postoperative 03/20/2015   Right inguinal hernia 02/01/2015   Right hip pain 02/01/2015   H/O neoplasm 10/03/2014   History of nonmelanoma skin cancer 10/03/2014   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   Spondylosis of lumbar region without myelopathy or radiculopathy 04/25/2014   H/O total knee replacement 04/18/2014   LBP (low back pain) 03/11/2014   Acute confusion 02/17/2014   Acute renal failure ([place]) 02/17/2014   Arterial blood pressure decreased 02/17/2014   Elevated WBC count 02/17/2014   Hypotension 02/17/2014   Anxiety and depression 02/15/2014   Personal history of other infectious and parasitic diseases 02/15/2014   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   Arthritis of knee, degenerative 12/13/2013   Derangement of posterior horn of medial meniscus due to old injury, left 12/13/2013   Primary osteoarthritis of left knee 12/13/2013   Subacromial impingement of right shoulder 08/19/2013   Acid reflux 08/02/2013   Human immunodeficiency virus (HIV) infection ([place]) 08/02/2013   Human immunodeficiency virus (HIV) disease ([place]) 08/02/2013   Biceps tendonitis on right 07/30/2013   Rotator cuff syndrome of left shoulder 07/30/2013    Past Surgical History:  Procedure Laterality Date   CARPAL TUNNEL RELEASE Left    COLONOSCOPY WITH PROPOFOL N/A 01/20/2020   Procedure: COLONOSCOPY WITH PROPOFOL;  Surgeon: Allen Norris,  Darren, MD;  Location: [facility];  Service: Endoscopy;  Laterality: N/A;   HERNIA REPAIR  1997   Left Lower Abdomen- [place], [place]   HERNIA REPAIR  [id number]   [place] Left 1996   [place], [place]   INGUINAL HERNIA REPAIR Right 1993   [place], [place]   JOINT REPLACEMENT     LAMINECTOMY     OPEN ANTERIOR SHOULDER  RECONSTRUCTION Right 08/2013   [facility]   REPLACEMENT TOTAL KNEE Right    REPLACEMENT TOTAL KNEE Left    [facility]   UMBILICAL HERNIA REPAIR  1997   [place], [place]    Prior to Admission medications   Medication Sig Start Date End Date Taking? Authorizing Provider  atorvastatin (LIPITOR) 20 MG tablet Take 30 mg by mouth daily. Takes one 20 mg and one 10 mg tablet. 07/27/19   [provider]  b complex vitamins tablet Take 1 tablet by mouth daily.    [provider]  busPIRone (BUSPAR) 10 MG tablet Take 10 mg by mouth 2 (two) times daily.    [provider]  clindamycin (CLEOCIN T) 1 % lotion  12/20/19   [provider]  clindamycin (CLEOCIN T) 1 % lotion Apply topically. 12/20/19 12/19/20  [provider]  clotrimazole-betamethasone (LOTRISONE) cream  08/26/19   [provider]  diclofenac Sodium (VOLTAREN) 1 % GEL Apply topically as needed. Patient not taking: Reported on 01/20/2020    [provider]  Dolutegravir-Rilpivirine 50-25 MG TABS Take 1 tablet by mouth daily.     [provider]  doxycycline (VIBRA-TABS) 100 MG tablet Take 100 mg by mouth 2 (two) times daily. 12/20/19   [provider]  escitalopram (LEXAPRO) 20 MG tablet Take 30 mg by mouth daily. Takes one 10 mg and 20 mg tablets a day.    [provider]  KRILL OIL PO Take by mouth daily.    [provider]  Omega-3 1000 MG CAPS Take 1 capsule by mouth daily.    [provider]  PROAIR HFA 108 [phone] Base) MCG/ACT inhaler Inhale into the lungs. 08/23/19   [provider]    Allergies Nicotine  Family History  Problem Relation Age of Onset   Diabetes Mother    CAD Father 59   Schizophrenia Grandson     Social History Social History   Tobacco Use   Smoking status: Every Day    Types: Cigarettes   Smokeless tobacco: Never   Tobacco comments:    10 cigarettes per day  Vaping Use   Vaping Use: Never used   Substance Use Topics   Alcohol use: Yes    Alcohol/week: 44.0 standard drinks    Types: 44 Cans of beer per week    Comment: 6 pack a day   Drug use: Not Currently    Types: Cocaine    Comment: Use to do cocaine 30 years ago    Review of Systems Constitutional: No fever/chills Eyes: No visual changes. ENT: No sore throat. Cardiovascular: Denies chest pain. Respiratory: Denies shortness of breath. Gastrointestinal: No abdominal pain.  Some nausea decreased appetite for the last day Genitourinary: Negative for dysuria.  See HPI.  Penis itself does not seem to be involved.  Just the scrotum and he seen a little bit of drainage from a couple of small bumps on the right side of the scrotum.  Reports the redness and pain is accelerated quite a bit the last 1 to 2 days  Musculoskeletal: Negative for back pain. Skin: Negative for rash except as noted regarding scrotum. Neurological: Negative for headaches.    ____________________________________________   PHYSICAL EXAM:  VITAL SIGNS: ED Triage Vitals [09/26/20 0907]  Enc Vitals Group     BP 100/62     Pulse Rate 82     Resp 16     Temp 98.4 F (36.9 C)     Temp Source Oral     SpO2 96 %     Weight 186 lb (84.4 kg)     Height '5\' 10"'$  (1.778 m)     Head Circumference      Peak Flow      Pain Score 9     Pain Loc      Pain Edu?      Excl. in [place]?     Constitutional: Alert and oriented. Well appearing and in no acute distress but movement of his legs if he is to try to move around he reports severe pain in his scrotal area and appears quite tender to touch. Eyes: Conjunctivae are normal. Head: Atraumatic. Nose: No congestion/rhinnorhea. Mouth/Throat: Mucous membranes are moist. Neck: No stridor.  Cardiovascular: Normal rate, regular rhythm. Grossly normal heart sounds.  Good peripheral circulation. Respiratory: Normal respiratory effort.  No retractions. Lungs CTAB. Gastrointestinal: Soft and nontender. No  distention. Genitourinary: Scrotum appears diffusely erythematous to touch, seems to have slight fullness over the right side compared to the left.  Warm to touch very tender.  Exquisitely tender over the right side where he has a couple small papules that appear to have drained very small superficial amount of purulence from them earlier.  These are currently closed.  There is no area of necrosis or gangrene denoted.  Erythema extends throughout the entirety of the scrotum but does not appear to advance to the perineum at this time or into the groin. Musculoskeletal: No lower extremity tenderness nor edema. Neurologic:  Normal speech and language. No gross focal neurologic deficits are appreciated.  Skin:  Skin is warm, dry and intact. No rash noted. Psychiatric: Mood and affect are normal. Speech and behavior are normal.  ____________________________________________   LABS (all labs ordered are listed, but only abnormal results are displayed)  Labs Reviewed  CBC WITH DIFFERENTIAL/PLATELET - Abnormal; Notable for the following components:      Result Value   RBC 4.13 (*)    MCV 101.7 (*)    MCH 36.6 (*)    Abs Immature Granulocytes 0.11 (*)    All other components within normal limits  COMPREHENSIVE METABOLIC PANEL - Abnormal; Notable for the following components:   Sodium 134 (*)    Glucose, Bld 109 (*)    Creatinine, Ser 1.42 (*)    Calcium 8.6 (*)    GFR, Estimated 53 (*)    All other components within normal limits  LACTIC ACID, PLASMA - Abnormal; Notable for the following components:   Lactic Acid, Venous 2.1 (*)    All other components within normal limits  CULTURE, BLOOD (ROUTINE X 2)  CULTURE, BLOOD (ROUTINE X 2)  RESP PANEL BY RT-PCR (FLU A&B, COVID) ARPGX2  URINALYSIS, COMPLETE (UACMP) WITH MICROSCOPIC   ____________________________________________  EKG   ____________________________________________  RADIOLOGY  US SCROTUM W/DOPPLER  Result Date:  09/26/2020 CLINICAL DATA:  Testicular pain, question abscess possibly on the right. Bilateral testicular pain, redness. EXAM: SCROTAL ULTRASOUND DOPPLER ULTRASOUND OF THE TESTICLES TECHNIQUE: Complete ultrasound examination of the testicles, epididymis, and other scrotal structures was performed. Color  and spectral Doppler ultrasound were also utilized to evaluate blood flow to the testicles. COMPARISON:  None. FINDINGS: Right testicle Measurements: 3.6 x 3.0 x 3.6 cm. No mass or microlithiasis visualized. Left testicle Measurements: 3.8 x 3.0 x 3.0 cm. No mass or microlithiasis visualized. Right epididymis:  8 mm epididymal head cyst. Left epididymis:  2 mm epididymal head cyst. Hydrocele:  None visualized. Varicocele:  None visualized. Pulsed Doppler interrogation of both testes demonstrates normal low resistance arterial and venous waveforms bilaterally. IMPRESSION: No testicular abnormality.  No evidence of torsion or infection. Electronically Signed   By: Rolm Baptise M.D.   On: 09/26/2020 12:01     Protal ultrasound reviewed negative for acute testicular abnormality or evidence of torsion or infection. ____________________________________________   PROCEDURES  Procedure(s) performed: None  Procedures  Critical Care performed: No  ____________________________________________   INITIAL IMPRESSION / ASSESSMENT AND PLAN / ED COURSE  Pertinent labs & imaging results that were available during my care of the patient were reviewed by me and considered in my medical decision making (see chart for details).   Patient presents with scrotal pain and erythema.  Hemodynamically stable in no acute distress.  Of note the patient does a history of HIV but reports compliance with therapy.  He has notable tenderness and erythema and involvement of the entire scrotum that appears consistent with cellulitis.  Penile shaft is noninteractive without extension of erythema.  Certainly appears to be a significant  encompassing cellulitis of the scrotum, at this point I do not see evidence of Fournier's or development of gangrene however certainly this lesion seems to be at risk of potentially worsening or extending especially as he reports a rapid worsening in the last 1 to 2 days.  Pain is well controlled with morphine at this time.  Discussed case with urology Dr. Bernardo Heater who is agreeable and will provide consultation for the patient, will admit to the hospitalist service for IV antibiotic therapy, urology consult, and aggressive antimicrobial treatment with the scrotal cellulitis that I believe has risk of advancing to Fournier's, presently no evidence of Fournier.  ----------------------------------------- 1:05 PM on 09/26/2020 ----------------------------------------- Patient understanding agreeable with plan for admission, urology consult.  Admission discussed with hospitalist Dr. Francine Graven      ____________________________________________   FINAL CLINICAL IMPRESSION(S) / ED DIAGNOSES  Final diagnoses:  Cellulitis, scrotum        Note:  This document was prepared using Dragon voice recognition software and may include unintentional dictation errors       Delman Kitten, MD 09/26/20 1323

## 2020-09-26 NOTE — H&P (Signed)
History and Physical    Tennis Must. [id number] DOB: [birth year] DOA: 09/26/2020  PCP: Theotis Burrow, MD   Patient coming from: Home  I have personally briefly reviewed patient's old medical records in [place]  Chief Complaint: Scrotal pain  HPI: Anthony Olson. is a 75 y.o. male with medical history significant for HIV, hypertension, anxiety disorder, alcohol and nicotine dependence who presents to the emergency room for evaluation of a 1 week history of redness and pain involving his scrotum.  He rates his pain a 10 x 10 in intensity at its worst but denies having any fever or chills. He notes increased discharge in his underwear and thinks it is from irritation around the scrotal area. He denies having any dysuria, no hematuria frequency. He denies having any chest pain, no shortness of breath, no nausea, no vomiting, no headache, no dizziness, no lightheadedness, no abdominal pain, no changes in his bowel habits, no lower extremity swelling. Patient states that he has increased his alcohol intake to help numb the pain without any significant improvement. Labs show sodium 134, potassium 3.8, chloride 100, bicarb 23, glucose 109, BUN 11, creatinine 1.42, calcium 8.6, alkaline phosphatase 104, albumin 3.8, AST 20, ALT 16, total protein 6.9, lactic acid 2.1, white count 10.1, hemoglobin 15.1, hematocrit 42, MCV 101.7, RDW 12.6, Scrotal ultrasound shows no testicular abnormality.  No evidence of torsion or infection.    ED Course: Patient is a 75 year old male who presents to the ER for evaluation of redness and pain involving his scrotum for over a week.  Symptoms appear to be worsening prompting his visit to the emergency room. He received a dose of Zosyn in the ER and urology has been consulted. He will be admitted to the hospital for further evaluation.    Review of Systems: As per HPI otherwise all other systems reviewed and negative.    Past Medical  History:  Diagnosis Date   Alcohol abuse    self-reported   Anxiety    Hernia, abdominal    HIV infection (HCC)    Sees Dr. Ola Spurr for this   HTN (hypertension)    Inguinal hernia    Kidney failure    Neoplasm of skin     Past Surgical History:  Procedure Laterality Date   CARPAL TUNNEL RELEASE Left    COLONOSCOPY WITH PROPOFOL N/A 01/20/2020   Procedure: COLONOSCOPY WITH PROPOFOL;  Surgeon: [name], MD;  Location: [facility] ENDOSCOPY;  Service: Endoscopy;  Laterality: N/A;   HERNIA REPAIR  1997   Left Lower Abdomen- [place], [place]   HERNIA REPAIR  [id number]   Supraumbilical- [place] Left 1996   [place]   INGUINAL HERNIA REPAIR Right 1993   [place]   JOINT REPLACEMENT     LAMINECTOMY     OPEN ANTERIOR SHOULDER RECONSTRUCTION Right 08/2013   [facility]   REPLACEMENT TOTAL KNEE Right    REPLACEMENT TOTAL KNEE Left    [facility]   UMBILICAL HERNIA REPAIR  1997   [place]     reports that he has been smoking cigarettes. He has never used smokeless tobacco. He reports current alcohol use of about 44.0 standard drinks of alcohol per week. He reports previous drug use. Drug: Cocaine.  Allergies  Allergen Reactions   Nicotine     Nicotine patches (mild reaction)    Family History  Problem Relation Age of Onset   Diabetes Mother    CAD Father  38   Schizophrenia Grandson       Prior to Admission medications   Medication Sig Start Date End Date Taking? Authorizing Provider  atorvastatin (LIPITOR) 40 MG tablet Take 40 mg by mouth at bedtime. 07/28/20   [provider]  b complex vitamins tablet Take 1 tablet by mouth daily.    [provider]  busPIRone (BUSPAR) 10 MG tablet Take 10 mg by mouth 2 (two) times daily.    [provider]  Dolutegravir-Rilpivirine 50-25 MG TABS Take 1 tablet by mouth daily.     [provider]  escitalopram (LEXAPRO) 10 MG tablet Take 10 mg by mouth daily. Take along with one 20 mg  tablet for total 30 mg once daily 09/20/20   [provider]  escitalopram (LEXAPRO) 20 MG tablet Take 30 mg by mouth daily. Takes one 10 mg and 20 mg tablets a day.    [provider]  KRILL OIL PO Take by mouth daily.    [provider]  lisinopril (ZESTRIL) 2.5 MG tablet Take 2.5 mg by mouth daily. 09/20/20   [provider]  Omega-3 1000 MG CAPS Take 1 capsule by mouth daily.    [provider]  PROAIR HFA 108 [phone] Base) MCG/ACT inhaler Inhale into the lungs. 08/23/19   [provider]    Physical Exam: Vitals:   09/26/20 0907 09/26/20 1223 09/26/20 1226 09/26/20 1349  BP: 100/62 128/85  127/85  Pulse: 82  62 69  Resp: 16 18    Temp: 98.4 F (36.9 C) 98.5 F (36.9 C)    TempSrc: Oral Oral    SpO2: 96%     Weight: 84.4 kg     Height: '5\' 10"'$  (1.778 m)        Vitals:   09/26/20 0907 09/26/20 1223 09/26/20 1226 09/26/20 1349  BP: 100/62 128/85  127/85  Pulse: 82  62 69  Resp: 16 18    Temp: 98.4 F (36.9 C) 98.5 F (36.9 C)    TempSrc: Oral Oral    SpO2: 96%     Weight: 84.4 kg     Height: '5\' 10"'$  (1.778 m)         Constitutional: Alert and oriented x 3 . Not in any apparent distress HEENT:      Head: Normocephalic and atraumatic.         Eyes: PERLA, EOMI, Conjunctivae are normal. Sclera is non-icteric.       Mouth/Throat: Mucous membranes are moist.       Neck: Supple with no signs of meningismus. Cardiovascular: Regular rate and rhythm. No murmurs, gallops, or rubs. 2+ symmetrical distal pulses are present . No JVD. No LE edema Respiratory: Respiratory effort normal .Lungs sounds clear bilaterally. No wheezes, crackles, or rhonchi.  Gastrointestinal: Soft, non tender, and non distended with positive bowel sounds.  Genitourinary: No CVA tenderness.  Redness involving the scrotum with some areas of purulence.  Very tender to touch Musculoskeletal: Nontender with normal range of motion in all extremities. No cyanosis,  or erythema of extremities. Neurologic:  Face is symmetric. Moving all extremities. No gross focal neurologic deficits . Skin: Skin is warm, dry.  No rash or ulcers Psychiatric: Mood and affect are normal    Labs on Admission: I have personally reviewed following labs and imaging studies  CBC: Recent Labs  Lab 09/26/20 0910  WBC 10.1  NEUTROABS 6.5  HGB 15.1  HCT 42.0  MCV 101.7*  PLT 159  Basic Metabolic Panel: Recent Labs  Lab 09/26/20 0910  NA 134*  K 3.8  CL 100  CO2 23  GLUCOSE 109*  BUN 11  CREATININE 1.42*  CALCIUM 8.6*   GFR: Estimated Creatinine Clearance: 48.6 mL/min (A) (by C-G formula based on SCr of 1.42 mg/dL (H)). Liver Function Tests: Recent Labs  Lab 09/26/20 0910  AST 20  ALT 16  ALKPHOS 104  BILITOT 0.9  PROT 6.9  ALBUMIN 3.8   No results for input(s): LIPASE, AMYLASE in the last 168 hours. No results for input(s): AMMONIA in the last 168 hours. Coagulation Profile: No results for input(s): INR, PROTIME in the last 168 hours. Cardiac Enzymes: No results for input(s): CKTOTAL, CKMB, CKMBINDEX, TROPONINI in the last 168 hours. BNP (last 3 results) No results for input(s): PROBNP in the last 8760 hours. HbA1C: No results for input(s): HGBA1C in the last 72 hours. CBG: No results for input(s): GLUCAP in the last 168 hours. Lipid Profile: No results for input(s): CHOL, HDL, LDLCALC, TRIG, CHOLHDL, LDLDIRECT in the last 72 hours. Thyroid Function Tests: No results for input(s): TSH, T4TOTAL, FREET4, T3FREE, THYROIDAB in the last 72 hours. Anemia Panel: No results for input(s): VITAMINB12, FOLATE, FERRITIN, TIBC, IRON, RETICCTPCT in the last 72 hours. Urine analysis:    Component Value Date/Time   COLORURINE YELLOW (A) 10/14/2019 2022   APPEARANCEUR HAZY (A) 10/14/2019 2022   LABSPEC 1.005 10/14/2019 2022   PHURINE 6.0 10/14/2019 2022   GLUCOSEU 50 (A) 10/14/2019 2022   HGBUR SMALL (A) 10/14/2019 2022   BILIRUBINUR NEGATIVE  10/14/2019 2022   KETONESUR NEGATIVE 10/14/2019 2022   PROTEINUR NEGATIVE 10/14/2019 2022   NITRITE NEGATIVE 10/14/2019 2022   LEUKOCYTESUR NEGATIVE 10/14/2019 2022    Radiological Exams on Admission: US SCROTUM W/DOPPLER  Result Date: 09/26/2020 CLINICAL DATA:  Testicular pain, question abscess possibly on the right. Bilateral testicular pain, redness. EXAM: SCROTAL ULTRASOUND DOPPLER ULTRASOUND OF THE TESTICLES TECHNIQUE: Complete ultrasound examination of the testicles, epididymis, and other scrotal structures was performed. Color and spectral Doppler ultrasound were also utilized to evaluate blood flow to the testicles. COMPARISON:  None. FINDINGS: Right testicle Measurements: 3.6 x 3.0 x 3.6 cm. No mass or microlithiasis visualized. Left testicle Measurements: 3.8 x 3.0 x 3.0 cm. No mass or microlithiasis visualized. Right epididymis:  8 mm epididymal head cyst. Left epididymis:  2 mm epididymal head cyst. Hydrocele:  None visualized. Varicocele:  None visualized. Pulsed Doppler interrogation of both testes demonstrates normal low resistance arterial and venous waveforms bilaterally. IMPRESSION: No testicular abnormality.  No evidence of torsion or infection. Electronically Signed   By: Rolm Baptise M.D.   On: 09/26/2020 12:01     Assessment/Plan Principal Problem:   Cellulitis of scrotum Active Problems:   Anxiety and depression   Human immunodeficiency virus (HIV) infection ([place])   Hypertensive disorder   Alcohol use disorder, severe, dependence (HCC)   Nicotine dependence      Cellulitis of scrotum Treat patient empirically with Zosyn Pain control Antipyretics Will consult urology     Anxiety and depression Continue Lexapro and buspirone    History of alcohol abuse Patient is at high risk of developing symptoms of alcohol withdrawal Will place patient on cardiac monitoring Place patient on CIWA protocol and administer Ativan for CIWA score of 8 or greater Place  patient on MVI, thiamine and folic acid    HIV Continue HAART    Hypertension Continue low-dose lisinopril   DVT prophylaxis: Lovenox  Code Status:  full code  Family Communication: Greater than 50% of time was spent discussing patient's condition and plan of care with him at the bedside.  All questions and concerns have been addressed.  He verbalizes understanding and agrees with the plan. Disposition Plan: Back to previous home environment Consults called: Urology Status: At the time of admission, it appears that the appropriate admission status for this patient is inpatient. This is judged to be reasonable and necessary in order to provide the required intensity of service to ensure the patient's safety given the presenting symptoms, physical exam findings, and initial radiographic and laboratory data in the context of their comorbid conditions. Patient requires inpatient status due to high intensity of service, high risk for further deterioration and high frequency of surveillance required.    Collier Bullock MD [facility]     09/26/2020, 2:17 PM

## 2020-09-26 NOTE — Consult Note (Signed)
Urology Consult  Requesting physician: Dr. Jacqualine Code  Reason for consultation: Scrotal cellulitis  Chief Complaint: Scrotal pain  History of Present Illness: Anthony Olson. is a 75 y.o. male who presented to the ED this morning with a 1 week history of scrotal pain  Progressively worsening scrotal pain and redness over the last 7 days Pain this morning rated 10/10 and worse with any movement or touching Nonradiating Mild nausea without vomiting Denied fever No abdominal pain Has noted mild scrotal drainage HIV + followed regularly by infectious disease Positive alcohol abuse-drank a sixpack of beer this morning to help his scrotal pain Denies prior history urologic problems Evaluation in the ED included a normal WBC at 10.1; lactic acid mildly elevated at 2.1; COVID tested negative Scrotal sonogram showed normal-appearing testes bilaterally.  Small, bilateral epididymal cysts.  No evidence of abscess Admitted to hospital service for IV antibiotics   Past Medical History:  Diagnosis Date   Alcohol abuse    self-reported   Anxiety    Hernia, abdominal    HIV infection (HCC)    Sees Dr. Ola Spurr for this   HTN (hypertension)    Inguinal hernia    Kidney failure    Neoplasm of skin     Past Surgical History:  Procedure Laterality Date   CARPAL TUNNEL RELEASE Left    COLONOSCOPY WITH PROPOFOL N/A 01/20/2020   Procedure: COLONOSCOPY WITH PROPOFOL;  Surgeon: [name], MD;  Location: [facility] ENDOSCOPY;  Service: Endoscopy;  Laterality: N/A;   HERNIA REPAIR  1997   Left Lower Abdomen- [place], [place]   HERNIA REPAIR  [id number]   [place] Left 1996   [place], [place]   INGUINAL HERNIA REPAIR Right 1993   [place], [place]   JOINT REPLACEMENT     LAMINECTOMY     OPEN ANTERIOR SHOULDER RECONSTRUCTION Right 08/2013   [facility]   REPLACEMENT TOTAL KNEE Right    REPLACEMENT TOTAL KNEE Left    [facility]   UMBILICAL HERNIA REPAIR  1997   [place], [place]    Home  Medications:  Current Meds  Medication Sig   atorvastatin (LIPITOR) 40 MG tablet Take 40 mg by mouth at bedtime.   b complex vitamins tablet Take 1 tablet by mouth daily.   busPIRone (BUSPAR) 10 MG tablet Take 10 mg by mouth 2 (two) times daily.   Dolutegravir-Rilpivirine 50-25 MG TABS Take 1 tablet by mouth daily.    escitalopram (LEXAPRO) 10 MG tablet Take 10 mg by mouth daily. Take along with one 20 mg tablet for total 30 mg once daily   escitalopram (LEXAPRO) 20 MG tablet Take 30 mg by mouth daily. Takes one 10 mg and 20 mg tablets a day.   lisinopril (ZESTRIL) 2.5 MG tablet Take 2.5 mg by mouth daily.   Omega-3 1000 MG CAPS Take 1 capsule by mouth daily.    Allergies:  Allergies  Allergen Reactions   Nicotine     Nicotine patches (mild reaction)    Family History  Problem Relation Age of Onset   Diabetes Mother    CAD Father 46   Schizophrenia Grandson     Social History:  reports that he has been smoking cigarettes. He has never used smokeless tobacco. He reports current alcohol use of about 44.0 standard drinks of alcohol per week. He reports previous drug use. Drug: Cocaine.  ROS: 14 point review of systems is negative except as per the HPI  Physical Exam:  Vital signs  in last 24 hours: Temp:  [97.8 F (36.6 C)-98.5 F (36.9 C)] 97.8 F (36.6 C) (07/26 1718) Pulse Rate:  [62-82] 74 (07/26 1718) Resp:  [16-18] 18 (07/26 1718) BP: (100-131)/(62-99) 113/99 (07/26 1718) SpO2:  [94 %-96 %] 94 % (07/26 1718) Weight:  [84.4 kg] 84.4 kg (07/26 0907) Constitutional:  Alert and oriented, No acute distress HEENT: [place] AT, moist mucus membranes.  Trachea midline, no masses Cardiovascular: Regular rate and rhythm Respiratory: Normal respiratory effort, lungs clear bilaterally GI: Abdomen is soft, nontender, nondistended, no abdominal masses GU: Phallus circumcised and normal in appearance.  Scrotal erythema with superficial purulent areas.  No scrotal skin thickening,  fluctuance or abscess.  No skin necrosis or crepitus.  Marked tenderness Skin: As above Neurologic: Grossly intact, no focal deficits, moving all 4 extremities Psychiatric: Normal mood and affect   Laboratory Data:  Recent Labs    09/26/20 0910  WBC 10.1  HGB 15.1  HCT 42.0   Recent Labs    09/26/20 0910  NA 134*  K 3.8  CL 100  CO2 23  GLUCOSE 109*  BUN 11  CREATININE 1.42*  CALCIUM 8.6*   No results for input(s): LABPT, INR in the last 72 hours. No results for input(s): LABURIN in the last 72 hours. Results for orders placed or performed during the hospital encounter of 09/26/20  Resp Panel by RT-PCR (Flu A&B, Covid) Nasopharyngeal Swab     Status: None   Collection Time: 09/26/20  2:16 PM   Specimen: Nasopharyngeal Swab; Nasopharyngeal(NP) swabs in vial transport medium  Result Value Ref Range Status   SARS Coronavirus 2 by RT PCR NEGATIVE NEGATIVE Final    Comment: (NOTE) SARS-CoV-2 target nucleic acids are NOT DETECTED.  The SARS-CoV-2 RNA is generally detectable in upper respiratory specimens during the acute phase of infection. The lowest concentration of SARS-CoV-2 viral copies this assay can detect is 138 copies/mL. A negative result does not preclude SARS-Cov-2 infection and should not be used as the sole basis for treatment or other patient management decisions. A negative result may occur with  improper specimen collection/handling, submission of specimen other than nasopharyngeal swab, presence of viral mutation(s) within the areas targeted by this assay, and inadequate number of viral copies(<138 copies/mL). A negative result must be combined with clinical observations, patient history, and epidemiological information. The expected result is Negative.  Fact Sheet for Patients:  EntrepreneurPulse.com.au  Fact Sheet for Healthcare Providers:  IncredibleEmployment.be  This test is no t yet approved or cleared by the  [place] FDA and  has been authorized for detection and/or diagnosis of SARS-CoV-2 by FDA under an Emergency Use Authorization (EUA). This EUA will remain  in effect (meaning this test can be used) for the duration of the COVID-19 declaration under Section 564(b)(1) of the Act, 21 U.S.C.section 360bbb-3(b)(1), unless the authorization is terminated  or revoked sooner.       Influenza A by PCR NEGATIVE NEGATIVE Final   Influenza B by PCR NEGATIVE NEGATIVE Final    Comment: (NOTE) The Xpert Xpress SARS-CoV-2/FLU/RSV plus assay is intended as an aid in the diagnosis of influenza from Nasopharyngeal swab specimens and should not be used as a sole basis for treatment. Nasal washings and aspirates are unacceptable for Xpert Xpress SARS-CoV-2/FLU/RSV testing.  Fact Sheet for Patients: EntrepreneurPulse.com.au  Fact Sheet for Healthcare Providers: IncredibleEmployment.be  This test is not yet approved or cleared by the [place] FDA and has been authorized for detection and/or diagnosis of SARS-CoV-2  by FDA under an Emergency Use Authorization (EUA). This EUA will remain in effect (meaning this test can be used) for the duration of the COVID-19 declaration under Section 564(b)(1) of the Act, 21 U.S.C. section 360bbb-3(b)(1), unless the authorization is terminated or revoked.  Performed at [facility], [address], [place], [place] [address]      Radiologic Imaging: Ultrasound images were personally reviewed and interpreted  US SCROTUM W/DOPPLER  Result Date: 09/26/2020 CLINICAL DATA:  Testicular pain, question abscess possibly on the right. Bilateral testicular pain, redness. EXAM: SCROTAL ULTRASOUND DOPPLER ULTRASOUND OF THE TESTICLES TECHNIQUE: Complete ultrasound examination of the testicles, epididymis, and other scrotal structures was performed. Color and spectral Doppler ultrasound were also utilized to evaluate blood  flow to the testicles. COMPARISON:  None. FINDINGS: Right testicle Measurements: 3.6 x 3.0 x 3.6 cm. No mass or microlithiasis visualized. Left testicle Measurements: 3.8 x 3.0 x 3.0 cm. No mass or microlithiasis visualized. Right epididymis:  8 mm epididymal head cyst. Left epididymis:  2 mm epididymal head cyst. Hydrocele:  None visualized. Varicocele:  None visualized. Pulsed Doppler interrogation of both testes demonstrates normal low resistance arterial and venous waveforms bilaterally. IMPRESSION: No testicular abnormality.  No evidence of torsion or infection. Electronically Signed   By: Rolm Baptise M.D.   On: 09/26/2020 12:01    Impression/Assessment:  Scrotal cellulitis No evidence of abscess and no indication for surgical management at present  Recommendation:  Continue IV antibiotic therapy Will follow   09/26/2020, 7:51 PM  John Giovanni,  MD

## 2020-09-26 NOTE — ED Notes (Signed)
Dr. Jacqualine Code notified of Lactic Acid of 2.1, face to face in ED.

## 2020-09-26 NOTE — ED Notes (Signed)
See triage note  Presents with pain to scrotal pain for about 1 weeks  Denies any injury  But has noticed some swelling and pus to area

## 2020-09-26 NOTE — Consult Note (Signed)
Pharmacy Antibiotic Note  Anthony Olson. is a 75 y.o. male w/ h/o HIV( on nnRTI+IntegInh), HTN, anxiety, EtOH/Nicotine dependence who presents with scrotal swelling, pain, redness, and pus x1 week. Admitted on 09/26/2020 with scrotal cellulitis.  Pharmacy has been consulted for Zosyn dosing.  Plan: Received Zosyn 3.375g x1 in ED 7/26 1205; then start Zosyn 3.375g IV q8h (4 hour infusion). (7/26 2000)  Height: '5\' 10"'$  (177.8 cm) Weight: 84.4 kg (186 lb) IBW/kg (Calculated) : 73  Temp (24hrs), Avg:98.5 F (36.9 C), Min:98.4 F (36.9 C), Max:98.5 F (36.9 C)  Recent Labs  Lab 09/26/20 0910 09/26/20 1150  WBC 10.1  --   CREATININE 1.42*  --   LATICACIDVEN  --  2.1*    Estimated Creatinine Clearance: 48.6 mL/min (A) (by C-G formula based on SCr of 1.42 mg/dL (H)).    Allergies  Allergen Reactions   Nicotine     Nicotine patches (mild reaction)    Antimicrobials this admission: Zosyn (7/26 >>  Dolutegravir/rilpivirine  (continuation from home)  Dose adjustments this admission: N/A  Microbiology results: 7/26 BCx: sent/pending 7/26 Flu/Cov: sent/pending    Thank you for allowing pharmacy to be a part of this patient's care.  Lorna Dibble 09/26/2020 2:33 PM

## 2020-09-26 NOTE — Consult Note (Signed)
PHARMACY -  BRIEF ANTIBIOTIC NOTE   Pharmacy has received consult(s) for Zosyn from an ED provider for treatment of scrotal cellulitis (swelling and pus).  The patient's profile has been reviewed for ht/wt/allergies/indication/available labs.    One time order(s) placed for : Zosyn 3.375g x1  Further antibiotics/pharmacy consults should be ordered by admitting physician if indicated.                       Thank you, Lorna Dibble 09/26/2020  11:12 AM

## 2020-09-26 NOTE — ED Notes (Signed)
ED Provider at bedside, urology consult, this RN was at bedside during the exam.

## 2020-09-26 NOTE — ED Triage Notes (Signed)
Arrived by EMS from home for testicular pain X1 week. Pain has became unbearable. Has had 6 pack beer this AM to help with pain. Patient reports pus and redness on scrotum.

## 2020-09-27 DIAGNOSIS — N492 Inflammatory disorders of scrotum: Principal | ICD-10-CM

## 2020-09-27 DIAGNOSIS — Z21 Asymptomatic human immunodeficiency virus [HIV] infection status: Secondary | ICD-10-CM | POA: Diagnosis not present

## 2020-09-27 DIAGNOSIS — F1020 Alcohol dependence, uncomplicated: Secondary | ICD-10-CM | POA: Diagnosis not present

## 2020-09-27 MED ORDER — MELATONIN 3 MG PO TABS: 3.0000 mg | ORAL_TABLET | Freq: Every day | ORAL | Status: DC

## 2020-09-27 MED ORDER — DOLUTEGRAVIR SODIUM 50 MG PO TABS
50.0000 mg | ORAL_TABLET | Freq: Every day | ORAL | Status: DC
  Administered 2020-09-27 – 2020-09-28 (×2): 50 mg via ORAL
  Filled 2020-09-27 (×2): qty 1

## 2020-09-27 MED ORDER — ENSURE ENLIVE PO LIQD
237.0000 mL | Freq: Two times a day (BID) | ORAL | Status: DC
  Administered 2020-09-27 – 2020-09-28 (×3): 237 mL via ORAL

## 2020-09-27 MED ORDER — RILPIVIRINE HCL 25 MG PO TABS
25.0000 mg | ORAL_TABLET | Freq: Every day | ORAL | Status: DC
  Administered 2020-09-27 – 2020-09-28 (×2): 25 mg via ORAL
  Filled 2020-09-27 (×2): qty 1

## 2020-09-27 MED ORDER — MELATONIN 5 MG PO TABS
2.5000 mg | ORAL_TABLET | Freq: Every day | ORAL | Status: DC
  Administered 2020-09-27 (×2): 2.5 mg via ORAL
  Filled 2020-09-27 (×3): qty 0.5

## 2020-09-27 NOTE — Plan of Care (Signed)

## 2020-09-27 NOTE — [place] Notes (Signed)
Initial Nutrition Assessment  DOCUMENTATION CODES:  Non-severe (moderate) malnutrition in context of social or environmental circumstances  INTERVENTION:  Continue current diet as ordered Ensure Enlive po BID, each supplement provides 350 kcal and 20 grams of protein Continue vitamin regimen for hx of EtOH abuse  NUTRITION DIAGNOSIS:  Moderate Malnutrition related to social / environmental circumstances (EtOH abuse) as evidenced by mild muscle depletion, severe fat depletion, mild fat depletion.  GOAL:  Patient will meet greater than or equal to 90% of their needs  MONITOR:  PO intake, Supplement acceptance, Weight trends, Skin  REASON FOR ASSESSMENT:  Malnutrition Screening Tool    ASSESSMENT:  75 y.o. male with medical history significant for HIV, HTN, anxiety, alcohol abuse, and nicotine dependence presented to ED with redness and pain to the scrotum x1 week.  Workup in ED suggestive for cellulitis, admitted for IV antibiotics. Urology evaluated and stated there was no evidence of abscess. Pt also placed on CIWA protocol for hx of EtOH abuse.  Pt resting in bed at the time of visit eating lunch. Pt reports a good appetite while admitted and enthusiastic about his meal. States that appetite has been poor for years and that over the last few months he has gone from    Nutritionally Relevant Medications: Scheduled Meds:  atorvastatin  40 mg Oral QHS   Dolutegravir-Rilpivirine  1 tablet Oral Daily   folic acid  1 mg Oral Daily   multivitamin with minerals  1 tablet Oral Daily   omega-3 acid ethyl esters  1 capsule Oral Daily   ondansetron  4 mg Oral Once   thiamine  100 mg Oral Daily   Continuous Infusions:  sodium chloride 125 mL/hr at 09/26/20 1522   piperacillin-tazobactam (ZOSYN) IV 3.375 g (09/27/20 0635)   PRN Meds: ondansetron  Labs Reviewed: Na 134 Creatinine 1.42  NUTRITION - FOCUSED PHYSICAL EXAM: Flowsheet Row Most Recent Value  Orbital Region Mild  depletion  Upper Arm Region Severe depletion  Thoracic and Lumbar Region Severe depletion  Buccal Region Mild depletion  Temple Region No depletion  Clavicle Bone Region No depletion  Clavicle and Acromion Bone Region No depletion  Scapular Bone Region No depletion  Dorsal Hand No depletion  Patellar Region Mild depletion  Anterior Thigh Region Mild depletion  Posterior Calf Region Mild depletion  Edema (RD Assessment) None  Hair Reviewed  Eyes Reviewed  Mouth Reviewed  Skin Reviewed  Nails Reviewed   Diet Order:   Diet Order             Diet 2 gram sodium Room service appropriate? Yes; Fluid consistency: Thin  Diet effective now                   EDUCATION NEEDS:  No education needs have been identified at this time  Skin:  Skin Assessment: Reviewed RN Assessment  Last BM:  PTA  Height:  Ht Readings from Last 1 Encounters:  09/26/20 '5\' 10"'$  (1.778 m)    Weight:  Wt Readings from Last 1 Encounters:  09/27/20 82.4 kg    Ideal Body Weight:  75.5 kg  BMI:  Body mass index is 26.07 kg/m.  Estimated Nutritional Needs:  Kcal:  1800-2000 kcal/d Protein:  90-100 g/d Fluid:  >2 L/d   Ranell Patrick, RD, LDN Clinical Dietitian Pager on [place]

## 2020-09-27 NOTE — TOC Initial Note (Addendum)
Transition of Care (TOC) - Initial/Assessment Note    Patient Details  Name: Anthony Olson. MRN: [id number] Date of Birth: November 18, 1945  Transition of Care [facility]) CM/SW Contact:    Shelbie Hutching, RN Phone Number: 09/27/2020, 4:04 PM  Clinical Narrative:                 Patient admitted to the hospital with scrotal cellulitis.  Patient is receiving IV antibiotics.  RNCM met with patient at the bedside for substance abuse resources.  Patient verbalizes that he wants to get into a program to help him stop drinking, resources for [place] for OP and Inpatient treatment provided to patient for him to follow up on.   Patient reports that he lives alone, he has a walker and cane and independent at home, works in his yard and garden.  He reports that caring for his home is becoming increasingly more difficult with his age and he is interested in [facility] at home.  He says that several times in the past 2 years he has passed out and fallen and hit whatever he was close to and it just comes out of the blue.  Patient has knocked out several of his teeth in these episodes.  Patient agrees to home health services at discharge.  He is current with his PCP and ID doctor at [facility].  Patient drives.  He has dogs that his neighbor is currently keeping an eye out for.  He has a personal emergency response system that he has had to use within the past 2 years when he has fallen.    Home Health referral has been given to [place] at [facility], she will see if they have availability to see patient.    Expected Discharge Plan: [place] Barriers to Discharge: Continued Medical Work up   Patient Goals and CMS Choice Patient states their goals for this hospitalization and ongoing recovery are:: Patient interested in alcohol treatment and having help at home with [facility] CMS Medicare.gov Compare Post Acute Care list provided to:: Patient Choice offered to / list  presented to : Patient  Expected Discharge Plan and Services Expected Discharge Plan: [place]   Discharge Planning Services: CM Consult Post Acute Care Choice: [place] arrangements for the past 2 months: Single Family Home                 DME Arranged: N/A DME Agency: NA       HH Arranged: RN, PT, Social Work          Prior Living Arrangements/Services Living arrangements for the past 2 months: [place] Lives with:: Pets, Self Patient language and need for interpreter reviewed:: Yes Do you feel safe going back to the place where you live?: Yes      Need for Family Participation in Patient Care: Yes (Comment) Care giver support system in place?: Yes (comment) (neighbor) Current home services: DME (walker, cane) Criminal Activity/Legal Involvement Pertinent to Current Situation/Hospitalization: No - Comment as needed  Activities of Daily Living Home Assistive Devices/Equipment: Eyeglasses, Environmental consultant (specify type) ADL Screening (condition at time of admission) Patient's cognitive ability adequate to safely complete daily activities?: Yes Is the patient deaf or have difficulty hearing?: No Does the patient have difficulty seeing, even when wearing glasses/contacts?: No Does the patient have difficulty concentrating, remembering, or making decisions?: No Patient able to express need for assistance with ADLs?: Yes  Does the patient have difficulty dressing or bathing?: No Independently performs ADLs?: Yes (appropriate for developmental age) Does the patient have difficulty walking or climbing stairs?: Yes Weakness of Legs: Both Weakness of Arms/Hands: None  Permission Sought/Granted Permission sought to share information with : Case Manager, Other (comment) Permission granted to share information with : Yes, Verbal Permission Granted     Permission granted to share info w AGENCY: Home Health Agency        Emotional  Assessment Appearance:: Appears stated age Attitude/Demeanor/Rapport: Engaged Affect (typically observed): Accepting Orientation: : Oriented to Self, Oriented to Place, Oriented to  Time, Oriented to Situation Alcohol / Substance Use: Alcohol Use, Tobacco Use Psych Involvement: No (comment)  Admission diagnosis:  Cellulitis of scrotum [N49.2] Cellulitis, scrotum [N49.2] Right testicular pain [N50.811] Patient Active Problem List   Diagnosis Date Noted   Cellulitis of scrotum 09/26/2020   Nicotine dependence 09/26/2020   Encounter for screening colonoscopy    Alcohol use disorder, severe, dependence ([place]) 12/22/2019   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/2019   History of posttraumatic stress disorder (PTSD) 12/22/2019   Cocaine use disorder, moderate, in sustained remission ([place]) 12/22/2019   Leg pain 11/10/2019   Unilateral recurrent inguinal hernia without obstruction or gangrene 10/02/2017   Syncope 09/18/2017   Monoclonal gammopathy of unknown significance (MGUS) 07/19/2016   Thrombocytopenia ([place]) 07/19/2016   Macrocytosis 03/20/2015   Kidney disease 03/20/2015   Peripheral vascular disease ([place]) 03/20/2015   Hypertensive disorder 03/20/2015   Peripheral neuritis 03/20/2015   Seborrheic dermatitis 74/10/1446   Urinary complication, postoperative 03/20/2015   Right inguinal hernia 02/01/2015   Right hip pain 02/01/2015   H/O neoplasm 10/03/2014   History of nonmelanoma skin cancer 10/03/2014   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   Spondylosis of lumbar region without myelopathy or radiculopathy 04/25/2014   H/O total knee replacement 04/18/2014   LBP (low back pain) 03/11/2014   Acute confusion 02/17/2014   Acute renal failure ([place]) 02/17/2014   Arterial blood pressure decreased 02/17/2014   Elevated WBC count 02/17/2014   Hypotension 02/17/2014   Anxiety and depression 02/15/2014   Personal history of other infectious and parasitic  diseases 02/15/2014   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   Arthritis of knee, degenerative 12/13/2013   Derangement of posterior horn of medial meniscus due to old injury, left 12/13/2013   Primary osteoarthritis of left knee 12/13/2013   Subacromial impingement of right shoulder 08/19/2013   Acid reflux 08/02/2013   Human immunodeficiency virus (HIV) infection ([place]) 08/02/2013   Human immunodeficiency virus (HIV) disease ([place]) 08/02/2013   Biceps tendonitis on right 07/30/2013   Rotator cuff syndrome of left shoulder 07/30/2013   PCP:  Theotis Burrow, MD Pharmacy:   [facility]  ([place]) - [place], [place] [address] [place] [address] Phone: [phone] Fax: [place] [address] ([address]), [place] - [place]) [place] [address] Phone: [phone] Fax: [phone]     Social Determinants of Health (SDOH) Interventions    Readmission Risk Interventions No flowsheet data found.

## 2020-09-27 NOTE — Progress Notes (Signed)
PROGRESS NOTE    Anthony Must.  EP:[id number] DOB: November 21, 1945 DOA: 09/26/2020 PCP: Theotis Burrow, MD   Chief complaint.  Pain in the scrotum. Brief Narrative:  Anthony Hardey. is a 75 y.o. male with medical history significant for HIV, hypertension, anxiety disorder, alcohol and nicotine dependence who presents to the emergency room for evaluation of a 1 week history of redness and pain involving his scrotum. Consult from urology obtained, patient is placed on Zosyn for cellulitis of scrotum.   Assessment & Plan:   Principal Problem:   Cellulitis of scrotum Active Problems:   Anxiety and depression   Human immunodeficiency virus (HIV) infection (HCC)   Hypertensive disorder   Alcohol use disorder, severe, dependence (HCC)   Nicotine dependence  #1.  Cellulitis of scrotum. Patient still have some pain, overall improving.  Continue antibiotic with Zosyn.  2.  Alcohol abuse. Frequent falls Patient had a frequent falls at home due to alcohol drinking. Currently patient does not have any withdrawal.  Advised to quit.  Magnesium and phosphate was normal.  #3.  HIV. Continue home medicines.  #4.  Chronic kidney disease stage IIIa. Hyponatremia Renal function still stable.  DVT prophylaxis: Lovenox Code Status: full Family Communication:  Disposition Plan:    Status is: Inpatient  Remains inpatient appropriate because:IV treatments appropriate due to intensity of illness or inability to take PO and Inpatient level of care appropriate due to severity of illness  Dispo: The patient is from: Home              Anticipated d/c is to: Home              Patient currently is not medically stable to d/c.   Difficult to place patient No        I/O last 3 completed shifts: In: 473.7 [I.V.:423.7; IV Piggyback:50] Out: 850 [Urine:850] Total I/O In: -  Out: 500 [Urine:500]     Consultants:  Urology  Procedures: None  Antimicrobials:  Zosyn  Subjective: Patient still complaining significant pain in the scrotum, but swelling is better. No fever chills per No short of breath or cough. No abdominal pain or nausea vomiting. No dysuria hematuria.  Objective: Vitals:   09/26/20 2035 09/27/20 0324 09/27/20 0709 09/27/20 1118  BP: 115/75 134/80 140/81 126/88  Pulse: 74 64 64 67  Resp:  '18 16 16  '$ Temp: 97.8 F (36.6 C) 98 F (36.7 C) 97.6 F (36.4 C) 98 F (36.7 C)  TempSrc:   Oral   SpO2: 97% 96% 98% 98%  Weight:      Height:        Intake/Output Summary (Last 24 hours) at 09/27/2020 1224 Last data filed at 09/27/2020 1100 Gross per 24 hour  Intake 473.73 ml  Output 1350 ml  Net -876.27 ml   Filed Weights   09/26/20 0907  Weight: 84.4 kg    Examination:  General exam: Appears calm and comfortable  Respiratory system: Clear to auscultation. Respiratory effort normal. Cardiovascular system: S1 & S2 heard, RRR. No JVD, murmurs, rubs, gallops or clicks. No pedal edema. Gastrointestinal system: Abdomen is nondistended, soft and nontender. No organomegaly or masses felt. Normal bowel sounds heard. Central nervous system: Alert and oriented. No focal neurological deficits. Extremities: Symmetric 5 x 5 power. Skin: No rashes, lesions or ulcers Psychiatry: Judgement and insight appear normal. Mood & affect appropriate.  Scrotum redness and tender to touch.   Data Reviewed: I have personally reviewed following labs  and imaging studies  CBC: Recent Labs  Lab 09/26/20 0910  WBC 10.1  NEUTROABS 6.5  HGB 15.1  HCT 42.0  MCV 101.7*  PLT [id number]   Basic Metabolic Panel: Recent Labs  Lab 09/26/20 0910 09/26/20 1416  NA 134*  --   K 3.8  --   CL 100  --   CO2 23  --   GLUCOSE 109*  --   BUN 11  --   CREATININE 1.42*  --   CALCIUM 8.6*  --   MG  --  2.3  PHOS  --  3.3   GFR: Estimated Creatinine Clearance: 48.6 mL/min (A) (by C-G formula based on SCr of 1.42 mg/dL (H)). Liver Function  Tests: Recent Labs  Lab 09/26/20 0910  AST 20  ALT 16  ALKPHOS 104  BILITOT 0.9  PROT 6.9  ALBUMIN 3.8   No results for input(s): LIPASE, AMYLASE in the last 168 hours. No results for input(s): AMMONIA in the last 168 hours. Coagulation Profile: No results for input(s): INR, PROTIME in the last 168 hours. Cardiac Enzymes: No results for input(s): CKTOTAL, CKMB, CKMBINDEX, TROPONINI in the last 168 hours. BNP (last 3 results) No results for input(s): PROBNP in the last 8760 hours. HbA1C: No results for input(s): HGBA1C in the last 72 hours. CBG: No results for input(s): GLUCAP in the last 168 hours. Lipid Profile: No results for input(s): CHOL, HDL, LDLCALC, TRIG, CHOLHDL, LDLDIRECT in the last 72 hours. Thyroid Function Tests: No results for input(s): TSH, T4TOTAL, FREET4, T3FREE, THYROIDAB in the last 72 hours. Anemia Panel: No results for input(s): VITAMINB12, FOLATE, FERRITIN, TIBC, IRON, RETICCTPCT in the last 72 hours. Sepsis Labs: Recent Labs  Lab 09/26/20 1150  LATICACIDVEN 2.1*    Recent Results (from the past 240 hour(s))  Culture, blood (Routine X 2) w Reflex to ID Panel     Status: None (Preliminary result)   Collection Time: 09/26/20 11:35 AM   Specimen: BLOOD  Result Value Ref Range Status   Specimen Description BLOOD  LEFT FOREARM  Final   Special Requests   Final    BOTTLES DRAWN AEROBIC AND ANAEROBIC Blood Culture adequate volume   Culture   Final    NO GROWTH < 24 HOURS Performed at [facility], [address], [place], [place] [address]    Report Status PENDING  Incomplete  Culture, blood (Routine X 2) w Reflex to ID Panel     Status: None (Preliminary result)   Collection Time: 09/26/20 11:45 AM   Specimen: BLOOD  Result Value Ref Range Status   Specimen Description BLOOD  LEFT AC  Final   Special Requests   Final    BOTTLES DRAWN AEROBIC AND ANAEROBIC Blood Culture adequate volume   Culture   Final    NO GROWTH < 24  HOURS Performed at [facility], [address], [place], [place] [address]    Report Status PENDING  Incomplete  Resp Panel by RT-PCR (Flu A&B, Covid) Nasopharyngeal Swab     Status: None   Collection Time: 09/26/20  2:16 PM   Specimen: Nasopharyngeal Swab; Nasopharyngeal(NP) swabs in vial transport medium  Result Value Ref Range Status   SARS Coronavirus 2 by RT PCR NEGATIVE NEGATIVE Final    Comment: (NOTE) SARS-CoV-2 target nucleic acids are NOT DETECTED.  The SARS-CoV-2 RNA is generally detectable in upper respiratory specimens during the acute phase of infection. The lowest concentration of SARS-CoV-2 viral copies this assay can detect is  138 copies/mL. A negative result does not preclude SARS-Cov-2 infection and should not be used as the sole basis for treatment or other patient management decisions. A negative result may occur with  improper specimen collection/handling, submission of specimen other than nasopharyngeal swab, presence of viral mutation(s) within the areas targeted by this assay, and inadequate number of viral copies(<138 copies/mL). A negative result must be combined with clinical observations, patient history, and epidemiological information. The expected result is Negative.  Fact Sheet for Patients:  EntrepreneurPulse.com.au  Fact Sheet for Healthcare Providers:  IncredibleEmployment.be  This test is no t yet approved or cleared by the [place] FDA and  has been authorized for detection and/or diagnosis of SARS-CoV-2 by FDA under an Emergency Use Authorization (EUA). This EUA will remain  in effect (meaning this test can be used) for the duration of the COVID-19 declaration under Section 564(b)(1) of the Act, 21 U.S.C.section 360bbb-3(b)(1), unless the authorization is terminated  or revoked sooner.       Influenza A by PCR NEGATIVE NEGATIVE Final   Influenza B by PCR NEGATIVE NEGATIVE Final     Comment: (NOTE) The Xpert Xpress SARS-CoV-2/FLU/RSV plus assay is intended as an aid in the diagnosis of influenza from Nasopharyngeal swab specimens and should not be used as a sole basis for treatment. Nasal washings and aspirates are unacceptable for Xpert Xpress SARS-CoV-2/FLU/RSV testing.  Fact Sheet for Patients: EntrepreneurPulse.com.au  Fact Sheet for Healthcare Providers: IncredibleEmployment.be  This test is not yet approved or cleared by the [place] FDA and has been authorized for detection and/or diagnosis of SARS-CoV-2 by FDA under an Emergency Use Authorization (EUA). This EUA will remain in effect (meaning this test can be used) for the duration of the COVID-19 declaration under Section 564(b)(1) of the Act, 21 U.S.C. section 360bbb-3(b)(1), unless the authorization is terminated or revoked.  Performed at [facility], [address], [place], [place] [address]          Radiology Studies: US SCROTUM W/DOPPLER  Result Date: 09/26/2020 CLINICAL DATA:  Testicular pain, question abscess possibly on the right. Bilateral testicular pain, redness. EXAM: SCROTAL ULTRASOUND DOPPLER ULTRASOUND OF THE TESTICLES TECHNIQUE: Complete ultrasound examination of the testicles, epididymis, and other scrotal structures was performed. Color and spectral Doppler ultrasound were also utilized to evaluate blood flow to the testicles. COMPARISON:  None. FINDINGS: Right testicle Measurements: 3.6 x 3.0 x 3.6 cm. No mass or microlithiasis visualized. Left testicle Measurements: 3.8 x 3.0 x 3.0 cm. No mass or microlithiasis visualized. Right epididymis:  8 mm epididymal head cyst. Left epididymis:  2 mm epididymal head cyst. Hydrocele:  None visualized. Varicocele:  None visualized. Pulsed Doppler interrogation of both testes demonstrates normal low resistance arterial and venous waveforms bilaterally. IMPRESSION: No testicular abnormality.  No  evidence of torsion or infection. Electronically Signed   By: Rolm Baptise M.D.   On: 09/26/2020 12:01        Scheduled Meds:  atorvastatin  40 mg Oral QHS   busPIRone  10 mg Oral BID   dolutegravir  50 mg Oral Q lunch   And   rilpivirine  25 mg Oral Q lunch   enoxaparin (LOVENOX) injection  40 mg Subcutaneous Q24H   escitalopram  30 mg Oral Daily   folic acid  1 mg Oral Daily   lisinopril  2.5 mg Oral Daily   LORazepam  0-4 mg Oral Q6H   Followed by   Derrill Memo ON 09/28/2020] LORazepam  0-4 mg Oral  Q12H   melatonin  2.5 mg Oral QHS   multivitamin with minerals  1 tablet Oral Daily   omega-3 acid ethyl esters  1 capsule Oral Daily   ondansetron  4 mg Oral Once   thiamine  100 mg Oral Daily   Or   thiamine  100 mg Intravenous Daily   Continuous Infusions:  sodium chloride 125 mL/hr at 09/26/20 1522   piperacillin-tazobactam (ZOSYN)  IV 3.375 g (09/27/20 0635)     LOS: 1 day    Time spent: 28 minutes    Sharen Hones, MD [facility]   To contact the attending provider between 7A-7P or the covering provider during after hours 7P-7A, please log into the web site [email or web address] and access using universal [place] password for that web site. If you do not have the password, please call the hospital operator.  09/27/2020, 12:24 PM

## 2020-09-27 NOTE — Progress Notes (Signed)
Urology Inpatient Progress Note  Subjective: No acute events overnight.  He is afebrile, VSS. No a.m. labs available. Today, patient reports stable to slightly improved scrotal discomfort with no acute concerns.  He has not noticed any new swelling.  He states overall the pain is better when he is lying down and when he is sitting.  He has been voiding and denies dysuria or the sensation of incomplete emptying.  Anti-infectives: Anti-infectives (From admission, onward)    Start     Dose/Rate Route Frequency Ordered Stop   09/27/20 1200  dolutegravir (TIVICAY) tablet 50 mg       See Hyperspace for full Linked Orders Report.   50 mg Oral Daily with lunch 09/27/20 1011     09/27/20 1200  rilpivirine (EDURANT) tablet 25 mg       See Hyperspace for full Linked Orders Report.   25 mg Oral Daily with lunch 09/27/20 1011     09/26/20 2000  piperacillin-tazobactam (ZOSYN) IVPB 3.375 g        3.375 g 12.5 mL/hr over 240 Minutes Intravenous Every 8 hours 09/26/20 1433     09/26/20 1415  Dolutegravir-Rilpivirine 50-25 MG TABS 1 tablet  Status:  Discontinued        1 tablet Oral Daily 09/26/20 1409 09/27/20 1010   09/26/20 1130  piperacillin-tazobactam (ZOSYN) IVPB 3.375 g        3.375 g 12.5 mL/hr over 240 Minutes Intravenous  Once 09/26/20 1115 09/26/20 1605       Current Facility-Administered Medications  Medication Dose Route Frequency Provider Last Rate Last Admin   0.9 %  sodium chloride infusion   Intravenous Continuous Agbata, Tochukwu, MD 125 mL/hr at 09/26/20 1522 New Bag at 09/26/20 1522   albuterol (PROVENTIL) (2.5 MG/3ML) 0.083% nebulizer solution 2.5 mg  2.5 mg Nebulization Q6H PRN Agbata, Tochukwu, MD       atorvastatin (LIPITOR) tablet 40 mg  40 mg Oral QHS Agbata, Tochukwu, MD   40 mg at 09/26/20 2043   busPIRone (BUSPAR) tablet 10 mg  10 mg Oral BID Agbata, Tochukwu, MD   10 mg at 09/27/20 1021   dolutegravir (TIVICAY) tablet 50 mg  50 mg Oral Q lunch Sharen Hones, MD   50 mg  at 09/27/20 1102   And   rilpivirine (EDURANT) tablet 25 mg  25 mg Oral Q lunch Sharen Hones, MD   25 mg at 09/27/20 1102   enoxaparin (LOVENOX) injection 40 mg  40 mg Subcutaneous Q24H Agbata, Tochukwu, MD   40 mg at 09/26/20 2043   escitalopram (LEXAPRO) tablet 30 mg  30 mg Oral Daily Agbata, Tochukwu, MD   30 mg at [id number]   folic acid (FOLVITE) tablet 1 mg  1 mg Oral Daily Agbata, Tochukwu, MD   1 mg at 09/27/20 1020   lisinopril (ZESTRIL) tablet 2.5 mg  2.5 mg Oral Daily Agbata, Tochukwu, MD   2.5 mg at 09/27/20 1017   LORazepam (ATIVAN) tablet 1-4 mg  1-4 mg Oral Q1H PRN Agbata, Tochukwu, MD       Or   LORazepam (ATIVAN) injection 1-4 mg  1-4 mg Intravenous Q1H PRN Agbata, Tochukwu, MD       LORazepam (ATIVAN) tablet 0-4 mg  0-4 mg Oral Q6H Agbata, Tochukwu, MD   1 mg at 09/27/20 1025   Followed by   Derrill Memo ON 09/28/2020] LORazepam (ATIVAN) tablet 0-4 mg  0-4 mg Oral Q12H Agbata, Tochukwu, MD       melatonin tablet  2.5 mg  2.5 mg Oral QHS Judd Gaudier V, MD   2.5 mg at 09/27/20 0144   morphine 2 MG/ML injection 2 mg  2 mg Intravenous Q4H PRN Agbata, Tochukwu, MD   2 mg at 09/27/20 1026   multivitamin with minerals tablet 1 tablet  1 tablet Oral Daily Agbata, Tochukwu, MD   1 tablet at 09/27/20 1016   omega-3 acid ethyl esters (LOVAZA) capsule 1 g  1 capsule Oral Daily Agbata, Tochukwu, MD   1 g at 09/27/20 1020   ondansetron (ZOFRAN) tablet 4 mg  4 mg Oral Q6H PRN Agbata, Tochukwu, MD       Or   ondansetron (ZOFRAN) injection 4 mg  4 mg Intravenous Q6H PRN Agbata, Tochukwu, MD       ondansetron (ZOFRAN-ODT) disintegrating tablet 4 mg  4 mg Oral Once Delman Kitten, MD       piperacillin-tazobactam (ZOSYN) IVPB 3.375 g  3.375 g Intravenous Q8H Beers, Erlene Quan D, RPH 12.5 mL/hr at 09/27/20 0635 3.375 g at 09/27/20 [id number]   thiamine tablet 100 mg  100 mg Oral Daily Agbata, Tochukwu, MD   100 mg at 09/27/20 1020   Or   thiamine (B-1) injection 100 mg  100 mg Intravenous Daily Agbata,  Tochukwu, MD       Objective: Vital signs in last 24 hours: Temp:  [97.6 F (36.4 C)-98.5 F (36.9 C)] 98 F (36.7 C) (07/27 1118) Pulse Rate:  [62-74] 67 (07/27 1118) Resp:  [16-18] 16 (07/27 1118) BP: (113-140)/(68-99) 126/88 (07/27 1118) SpO2:  [94 %-98 %] 98 % (07/27 1118)  Intake/Output from previous day: 07/26 0701 - 07/27 0700 In: 473.7 [I.V.:423.7; IV Piggyback:50] Out: 850 [Urine:850] Intake/Output this shift: Total I/O In: -  Out: 500 [Urine:500]  Physical Exam Vitals and nursing note reviewed.  Constitutional:      General: He is not in acute distress.    Appearance: He is not ill-appearing, toxic-appearing or diaphoretic.  HENT:     Head: Normocephalic and atraumatic.  Pulmonary:     Effort: Pulmonary effort is normal.     Breath sounds: Normal breath sounds.  Genitourinary:    Comments: Erythematous scrotum without tissue thickening, induration, fluctuance, crepitus, or purulence.  Superficial pustule noted over the right lateral hemiscrotum.  Bilateral descended testicles, no enlargement or tenderness of the bilateral epididymides. Skin:    General: Skin is warm and dry.  Neurological:     Mental Status: He is alert and oriented to person, place, and time.  Psychiatric:        Mood and Affect: Mood normal.        Behavior: Behavior normal.   Lab Results:  Recent Labs    09/26/20 0910  WBC 10.1  HGB 15.1  HCT 42.0  PLT 159   BMET Recent Labs    09/26/20 0910  NA 134*  K 3.8  CL 100  CO2 23  GLUCOSE 109*  BUN 11  CREATININE 1.42*  CALCIUM 8.6*   Assessment & Plan: 75 year old HIV-positive male with history of EtOH abuse admitted with scrotal cellulitis on antibiotics.  No new significant findings today on physical exam.  Agree with continued IV antibiotic therapy.  Debroah Loop, PA-C 09/27/2020

## 2020-09-27 NOTE — Evaluation (Signed)
Physical Therapy Evaluation Patient Details Name: Anthony Olson. MRN: [id number] DOB: 05-26-1945 Today's Date: 09/27/2020   History of Present Illness  75 y.o. male with medical history significant for HIV, hypertension, anxiety disorder, alcohol and nicotine dependence who presents to the emergency room for evaluation of a 1 week history of redness and pain involving his scrotum.  Clinical Impression  From a PT perspective pt did quite well, he had some hesitancy with bed mobility 2/2 cellulitis/swelling, but was able to do all mobility, transfers and ambulation w/o assist and with relative safety.  He has ADs at home that he does not use, did utilize today 2/2 pain and need for wide leg cadence.  Pt with no real PT needs, discussed ETOH/deyhdration and it's possible role in his passing out episodes, also discussed (as he has no one that can assist with errands, etc) that looking into a group home/ILF setting is probably a good idea going forward and he assures me he has been working on this.  Pt ambulates and mobilizes safely, no further PT needs at this time.    Follow Up Recommendations No PT follow up    Equipment Recommendations  None recommended by PT (recommended he does use AD, at least initially, when he gets home)    Recommendations for Other Services       Precautions / Restrictions Precautions Precautions: None      Mobility  Bed Mobility Overal bed mobility: Modified Independent             General bed mobility comments: guarded 2/2 cellulitis pain, but did not need assist    Transfers Overall transfer level: Modified independent Equipment used: None             General transfer comment: Pt was able to rise to standing w/o assist or AD, wide BOS  Ambulation/Gait Ambulation/Gait assistance: Supervision Gait Distance (Feet): 200 Feet Assistive device: Rolling walker (2 wheeled)       General Gait Details: Pt with slow but safe ambulate with wide  BOS (2/2 scrotal pain), he did not have any LOBs, vitals appropriate t/o, no real fatigue. We decided to utilize AD 2/2 pain,  reports feeling good to get up but having increased pain with the effort.  Stairs            Wheelchair Mobility    Modified Rankin (Stroke Patients Only)       Balance Overall balance assessment: Modified Independent                                           Pertinent Vitals/Pain Pain Assessment: 0-10 Pain Score: 8  Pain Location: scrotal pain    Home Living Family/patient expects to be discharged to:: Private residence Living Arrangements: Alone Available Help at Discharge:  ("no one")   Home Access: Stairs to enter   [employer] of Steps: 2 Home Layout: One level Home Equipment: Walker - 2 wheels;Cane - single point      Prior Function Level of Independence: Independent         Comments: Pt reprts that he is able to get out of the house multiple times a week, just goes a few blocks for groceries and is able to negotiate the store.  Pt has had falls with some repeated passing out issues, has been looking into getting into group home-type setting  Hand Dominance        Extremity/Trunk Assessment   Upper Extremity Assessment Upper Extremity Assessment: Overall WFL for tasks assessed    Lower Extremity Assessment Lower Extremity Assessment: Overall WFL for tasks assessed       Communication   Communication: No difficulties  Cognition Arousal/Alertness: Awake/alert Behavior During Therapy: WFL for tasks assessed/performed Overall Cognitive Status: Within Functional Limits for tasks assessed                                        General Comments General comments (skin integrity, edema, etc.): Once cellulitis/swelling subsides pt should be able to safely return home and mobilize w/o issue, no real PT needs at this time    Exercises     Assessment/Plan    PT Assessment  Patent does not need any further PT services  PT Problem List         PT Treatment Interventions      PT Goals (Current goals can be found in the Care Plan section)  Acute Rehab PT Goals Patient Stated Goal: control cellulitis/swelling PT Goal Formulation: All assessment and education complete, DC therapy    Frequency     Barriers to discharge        Co-evaluation               AM-PAC PT "6 Clicks" Mobility  Outcome Measure Help needed turning from your back to your side while in a flat bed without using bedrails?: None Help needed moving from lying on your back to sitting on the side of a flat bed without using bedrails?: None Help needed moving to and from a bed to a chair (including a wheelchair)?: None Help needed standing up from a chair using your arms (e.g., wheelchair or bedside chair)?: None Help needed to walk in hospital room?: None Help needed climbing 3-5 steps with a railing? : None 6 Click Score: 24    End of Session   Activity Tolerance: Patient tolerated treatment well;Patient limited by pain Patient left: with call bell/phone within reach;with bed alarm set;with nursing/sitter in room Nurse Communication: Mobility status PT Visit Diagnosis: Other abnormalities of gait and mobility (R26.89);Repeated falls (R29.6)    Time: 1435-1500 PT Time Calculation (min) (ACUTE ONLY): 25 min   Charges:   PT Evaluation $PT Eval Low Complexity: 1 Low          Kreg Shropshire, DPT 09/27/2020, 4:44 PM

## 2020-09-28 DIAGNOSIS — E440 Moderate protein-calorie malnutrition: Secondary | ICD-10-CM | POA: Insufficient documentation

## 2020-09-28 DIAGNOSIS — N492 Inflammatory disorders of scrotum: Secondary | ICD-10-CM | POA: Diagnosis not present

## 2020-09-28 DIAGNOSIS — F1020 Alcohol dependence, uncomplicated: Secondary | ICD-10-CM | POA: Diagnosis not present

## 2020-09-28 DIAGNOSIS — Z21 Asymptomatic human immunodeficiency virus [HIV] infection status: Secondary | ICD-10-CM | POA: Diagnosis not present

## 2020-09-28 LAB — CREATININE, SERUM
Creatinine, Ser: 1.37 mg/dL — ABNORMAL HIGH (ref 0.61–1.24)
GFR, Estimated: 55 mL/min — ABNORMAL LOW (ref >60)

## 2020-09-28 MED ORDER — AMOXICILLIN-POT CLAVULANATE 875-125 MG PO TABS: 1.0000 | ORAL_TABLET | Freq: Two times a day (BID) | ORAL | 0 refills | Status: DC

## 2020-09-28 NOTE — TOC Progression Note (Signed)
Transition of Care (TOC) - Progression Note    Patient Details  Name: Anthony Olson. MRN: [id number] Date of Birth: 1945/07/28  Transition of Care [facility]) CM/SW Contact  Shelbie Hutching, RN Phone Number: 09/28/2020, 12:11 PM  Clinical Narrative:    [facility] called to arrange door to door.  They will call me back once they get a driver.    Expected Discharge Plan: [place] Barriers to Discharge: Barriers Resolved  Expected Discharge Plan and Services Expected Discharge Plan: [place]   Discharge Planning Services: CM Consult Post Acute Care Choice: [place] arrangements for the past 2 months: Single Family Home Expected Discharge Date: 09/28/20               DME Arranged: N/A DME Agency: NA       HH Arranged: NA           Social Determinants of Health (SDOH) Interventions    Readmission Risk Interventions No flowsheet data found.

## 2020-09-28 NOTE — Discharge Summary (Signed)
Physician Discharge Summary  Patient ID: Anthony Olson. MRN: [id number] DOB/AGE: October 28, 1945 75 y.o.  Admit date: 09/26/2020 Discharge date: 09/28/2020  Admission Diagnoses:  Discharge Diagnoses:  Principal Problem:   Cellulitis of scrotum Active Problems:   Anxiety and depression   Human immunodeficiency virus (HIV) infection ([place])   Hypertensive disorder   Alcohol use disorder, severe, dependence ([place])   Nicotine dependence   Malnutrition of moderate degree Sepsis ruled out.  Discharged Condition: good  Hospital Course:  Anthony Olson. is a 75 y.o. male with medical history significant for HIV, hypertension, anxiety disorder, alcohol and nicotine dependence who presents to the emergency room for evaluation of a 1 week history of redness and pain involving his scrotum. Consult from urology obtained, patient is placed on Zosyn for cellulitis of scrotum.  #1.  Cellulitis of scrotum. Patient treated with 2 days of Zosyn, condition much improved.  Pain has resolved, swelling has resolved.  At this point, we will continue antibiotics for 5 days with Augmentin.  Patient will follow up with PCP in 1 week.  2.  Alcohol abuse. Frequent falls Patient had a frequent falls at home due to alcohol drinking. Currently patient does not have any withdrawal.  Advised to quit.   Patient is evaluated by physical therapy, he does not need additional PT/OT.   #3.  HIV. Continue home medicines.   #4.  Chronic kidney disease stage IIIa. Hyponatremia Renal function still stable.    Consults: urology Significant Diagnostic Studies:   SCROTAL ULTRASOUND   DOPPLER ULTRASOUND OF THE TESTICLES   TECHNIQUE: Complete ultrasound examination of the testicles, epididymis, and other scrotal structures was performed. Color and spectral Doppler ultrasound were also utilized to evaluate blood flow to the testicles.   COMPARISON:  None.   FINDINGS: Right testicle   Measurements: 3.6 x 3.0  x 3.6 cm. No mass or microlithiasis visualized.   Left testicle   Measurements: 3.8 x 3.0 x 3.0 cm. No mass or microlithiasis visualized.   Right epididymis:  8 mm epididymal head cyst.   Left epididymis:  2 mm epididymal head cyst.   Hydrocele:  None visualized.   Varicocele:  None visualized.   Pulsed Doppler interrogation of both testes demonstrates normal low resistance arterial and venous waveforms bilaterally.   IMPRESSION: No testicular abnormality.  No evidence of torsion or infection.     Electronically Signed   By: Rolm Baptise M.D.   On: 09/26/2020 12:01  Treatments: zosyn  Discharge Exam: Blood pressure 121/86, pulse 60, temperature (!) 97.5 F (36.4 C), temperature source Oral, resp. rate 18, height '5\' 10"'$  (1.778 m), weight 82.4 kg, SpO2 99 %. General appearance: alert and cooperative Resp: clear to auscultation bilaterally Cardio: regular rate and rhythm, S1, S2 normal, no murmur, click, rub or gallop GI: soft, non-tender; bowel sounds normal; no masses,  no organomegaly Extremities: extremities normal, atraumatic, no cyanosis or edema Scrotum edema resolved, no significant tender, redness much improved  Disposition: Discharge disposition: 01-Home or Self Care       Discharge Instructions     Diet - low sodium heart healthy   Complete by: As directed    Increase activity slowly   Complete by: As directed       Allergies as of 09/28/2020       Reactions   Nicotine    Nicotine patches (mild reaction)        Medication List     TAKE these medications  amoxicillin-clavulanate 875-125 MG tablet Commonly known as: Augmentin Take 1 tablet by mouth 2 (two) times daily for 5 days.   atorvastatin 40 MG tablet Commonly known as: LIPITOR Take 40 mg by mouth at bedtime.   b complex vitamins tablet Take 1 tablet by mouth daily.   busPIRone 10 MG tablet Commonly known as: BUSPAR Take 10 mg by mouth 2 (two) times daily.    Dolutegravir-Rilpivirine 50-25 MG Tabs Take 1 tablet by mouth daily.   escitalopram 20 MG tablet Commonly known as: LEXAPRO Take 30 mg by mouth daily. Takes one 10 mg and 20 mg tablets a day. What changed: Another medication with the same name was removed. Continue taking this medication, and follow the directions you see here.   KRILL OIL PO Take by mouth daily.   lisinopril 2.5 MG tablet Commonly known as: ZESTRIL Take 2.5 mg by mouth daily.   Omega-3 1000 MG Caps Take 1 capsule by mouth daily.   ProAir HFA 108 (90 Base) MCG/ACT inhaler Generic drug: albuterol Inhale 1-2 puffs into the lungs every 6 (six) hours as needed for wheezing or shortness of breath.        Follow-up Information     Revelo, Elyse Jarvis, MD Follow up in 1 week(s).   Specialty: Family Medicine Contact information: [address] [address] [phone]         Kate Sable, MD .   Specialties: Cardiology, Radiology Contact information: [place] [address] [id number]                 Signed: Sharen Hones 09/28/2020, 9:05 AM

## 2020-09-28 NOTE — Evaluation (Signed)
Occupational Therapy Evaluation Patient Details Name: Anthony Olson. MRN: [id number] DOB: 17-Feb-1946 Today's Date: 09/28/2020    History of Present Illness 75 y.o. male with medical history significant for HIV, hypertension, anxiety disorder, alcohol and nicotine dependence who presents to the emergency room for evaluation of a 1 week history of redness and pain involving his scrotum.   Clinical Impression   Anthony Olson did well with OT this morning, able to perform bed mobility, transfers, w/in room ambulation, grooming, dressing, with IND/Mod I. He denies pain and reports that he believes he is back to his baseline level of fxl mobility and is eager to go home. He lives alone, has little in the way of social support, and has had several serious falls in the previous year. Discussed making modifications to his housing situation (perhaps move to an ALF) and reducing level of alcohol consumption. He does have a bit of difficulty reaching his feet; he addresses this by wearing slip-on shoes w/o socks. Pt does not need any additional OT services at this time.     Follow Up Recommendations  No OT follow up    Equipment Recommendations  None recommended by OT    Recommendations for Other Services       Precautions / Restrictions Precautions Precautions: None Restrictions Weight Bearing Restrictions: No      Mobility Bed Mobility Overal bed mobility: Modified Independent                  Transfers Overall transfer level: Modified independent Equipment used: Rolling walker (2 wheeled)                  Balance Overall balance assessment: Modified Independent                                         ADL either performed or assessed with clinical judgement   ADL Overall ADL's : Modified independent                                             Vision Baseline Vision/History: Wears glasses Patient Visual Report: No change from  baseline       Perception     Praxis      Pertinent Vitals/Pain Pain Assessment: No/denies pain     Hand Dominance     Extremity/Trunk Assessment Upper Extremity Assessment Upper Extremity Assessment: Overall WFL for tasks assessed   Lower Extremity Assessment Lower Extremity Assessment: Overall WFL for tasks assessed       Communication Communication Communication: No difficulties   Cognition Arousal/Alertness: Awake/alert Behavior During Therapy: WFL for tasks assessed/performed Overall Cognitive Status: Within Functional Limits for tasks assessed                                     General Comments  Pt states he is back to baseline level    Exercises Other Exercises Other Exercises: bed mobility, transfers, dressing, w/in room ambulation. Discussion re: DC plans, living situation, home safety   Shoulder Instructions      Home Living Family/patient expects to be discharged to:: Private residence Living Arrangements: Alone Available Help at Discharge: Other (Comment) (no help at home)  Home Access: Stairs to enter Entrance Stairs-Number of Steps: 2   Home Layout: One level     Bathroom Shower/Tub: Tub/shower unit         Home Equipment: Environmental consultant - 2 wheels;Cane - single point;Grab bars - tub/shower          Prior Functioning/Environment Level of Independence: Independent        Comments: Drives short distances, IND in IADLs, at least 2 falls in previous 12 months        OT Problem List: Decreased strength;Decreased activity tolerance;Pain      OT Treatment/Interventions:      OT Goals(Current goals can be found in the care plan section) Acute Rehab OT Goals Patient Stated Goal: to go home OT Goal Formulation: With patient Time For Goal Achievement: 10/12/20 Potential to Achieve Goals: Good  OT Frequency:     Barriers to D/C:            Co-evaluation              AM-PAC OT "6 Clicks" Daily Activity      Outcome Measure Help from another person eating meals?: None Help from another person taking care of personal grooming?: None Help from another person toileting, which includes using toliet, bedpan, or urinal?: None Help from another person bathing (including washing, rinsing, drying)?: None Help from another person to put on and taking off regular upper body clothing?: None Help from another person to put on and taking off regular lower body clothing?: A Little 6 Click Score: 23   End of Session    Activity Tolerance: Patient tolerated treatment well Patient left: in bed;with call bell/phone within reach;with bed alarm set  OT Visit Diagnosis: Unsteadiness on feet (R26.81)                Time: [id number] OT Time Calculation (min): 28 min Charges:  OT General Charges $OT Visit: 1 Visit OT Evaluation $OT Eval Low Complexity: 1 Low OT Treatments $Self Care/Home Management : 23-37 mins Josiah Lobo, PhD, MS, OTR/L 09/28/20, 12:43 PM

## 2020-09-28 NOTE — TOC Progression Note (Signed)
Transition of Care (TOC) - Progression Note    Patient Details  Name: Anthony Olson. MRN: [id number] Date of Birth: May 13, 1945  Transition of Care [facility]) CM/SW Contact  Shelbie Hutching, RN Phone Number: 09/28/2020, 10:50 AM  Clinical Narrative:    Unable to get in touch with [facility] so patient reports he needs a ride home.  RNCM will arrange for [facility] once RN completes discharge.    Expected Discharge Plan: [place] Barriers to Discharge: Barriers Resolved  Expected Discharge Plan and Services Expected Discharge Plan: [place]   Discharge Planning Services: CM Consult Post Acute Care Choice: [place] arrangements for the past 2 months: Single Family Home Expected Discharge Date: 09/28/20               DME Arranged: N/A DME Agency: NA       HH Arranged: NA           Social Determinants of Health (SDOH) Interventions    Readmission Risk Interventions No flowsheet data found.

## 2020-09-28 NOTE — Plan of Care (Signed)
  Problem: Safety: Goal: Ability to remain free from injury will improve Outcome: Not Progressing  Alert with some confusion,  encouraged to use call bell system, encouraged ambulation with rolling walker, no fall or injury at this time,

## 2020-09-28 NOTE — TOC Transition Note (Signed)
Transition of Care [facility]) - CM/SW Discharge Note   Patient Details  Name: Anthony Olson. MRN: [id number] Date of Birth: February 15, 1946  [facility]) CM/SW Contact:  Shelbie Hutching, RN Phone Number: 09/28/2020, 10:39 AM   Clinical Narrative:    Patient medically cleared for discharge home.  RNCM explained to patient that he does not qualify for home health services at this time.  Patient verbalizes understanding and will follow up at [facility].  Patient reports that his friend Myna Bright will come and pick him up today.    Final next level of care: Home/Self Care Barriers to Discharge: Barriers Resolved   Patient Goals and CMS Choice Patient states their goals for this hospitalization and ongoing recovery are:: Patient interested in alcohol treatment and having help at home with [facility] CMS Medicare.gov Compare Post Acute Care list provided to:: Patient Choice offered to / list presented to : Patient  Discharge Placement                       Discharge Plan and Services   Discharge Planning Services: CM Consult Post Acute Care Choice: Home Health          DME Arranged: N/A DME Agency: NA       HH Arranged: NA          Social Determinants of Health (SDOH) Interventions     Readmission Risk Interventions No flowsheet data found.

## 2020-10-01 ENCOUNTER — Emergency Department: Payer: Medicare Other

## 2020-10-01 ENCOUNTER — Encounter: Payer: Self-pay | Admitting: Internal Medicine

## 2020-10-01 ENCOUNTER — Inpatient Hospital Stay
Admission: EM | Admit: 2020-10-01 | Discharge: 2020-10-06 | DRG: 534 | Disposition: A | Discharge status: Skilled Nursing Facility | Payer: Medicare Other | Attending: Hospitalist | Admitting: Hospitalist

## 2020-10-01 ENCOUNTER — Other Ambulatory Visit: Payer: Self-pay

## 2020-10-01 DIAGNOSIS — Z96659 Presence of unspecified artificial knee joint: Secondary | ICD-10-CM

## 2020-10-01 DIAGNOSIS — Z20822 Contact with and (suspected) exposure to covid-19: Secondary | ICD-10-CM | POA: Diagnosis present

## 2020-10-01 DIAGNOSIS — I10 Essential (primary) hypertension: Secondary | ICD-10-CM | POA: Diagnosis present

## 2020-10-01 DIAGNOSIS — F1010 Alcohol abuse, uncomplicated: Secondary | ICD-10-CM | POA: Diagnosis present

## 2020-10-01 DIAGNOSIS — R296 Repeated falls: Secondary | ICD-10-CM | POA: Diagnosis present

## 2020-10-01 DIAGNOSIS — Y903 Blood alcohol level of 60-79 mg/100 ml: Secondary | ICD-10-CM | POA: Diagnosis present

## 2020-10-01 DIAGNOSIS — S72401A Unspecified fracture of lower end of right femur, initial encounter for closed fracture: Secondary | ICD-10-CM | POA: Diagnosis not present

## 2020-10-01 DIAGNOSIS — N492 Inflammatory disorders of scrotum: Secondary | ICD-10-CM | POA: Diagnosis present

## 2020-10-01 DIAGNOSIS — W19XXXA Unspecified fall, initial encounter: Secondary | ICD-10-CM | POA: Diagnosis present

## 2020-10-01 DIAGNOSIS — Z888 Allergy status to other drugs, medicaments and biological substances status: Secondary | ICD-10-CM | POA: Diagnosis not present

## 2020-10-01 DIAGNOSIS — S72421A Displaced fracture of lateral condyle of right femur, initial encounter for closed fracture: Principal | ICD-10-CM | POA: Diagnosis present

## 2020-10-01 DIAGNOSIS — I129 Hypertensive chronic kidney disease with stage 1 through stage 4 chronic kidney disease, or unspecified chronic kidney disease: Secondary | ICD-10-CM | POA: Diagnosis present

## 2020-10-01 DIAGNOSIS — Z8249 Family history of ischemic heart disease and other diseases of the circulatory system: Secondary | ICD-10-CM | POA: Diagnosis not present

## 2020-10-01 DIAGNOSIS — B20 Human immunodeficiency virus [HIV] disease: Secondary | ICD-10-CM | POA: Diagnosis present

## 2020-10-01 DIAGNOSIS — F10229 Alcohol dependence with intoxication, unspecified: Secondary | ICD-10-CM | POA: Diagnosis present

## 2020-10-01 DIAGNOSIS — Z66 Do not resuscitate: Secondary | ICD-10-CM | POA: Diagnosis present

## 2020-10-01 DIAGNOSIS — Y92009 Unspecified place in unspecified non-institutional (private) residence as the place of occurrence of the external cause: Secondary | ICD-10-CM | POA: Diagnosis not present

## 2020-10-01 DIAGNOSIS — R451 Restlessness and agitation: Secondary | ICD-10-CM | POA: Diagnosis present

## 2020-10-01 DIAGNOSIS — Z21 Asymptomatic human immunodeficiency virus [HIV] infection status: Secondary | ICD-10-CM

## 2020-10-01 DIAGNOSIS — M6282 Rhabdomyolysis: Secondary | ICD-10-CM | POA: Diagnosis present

## 2020-10-01 DIAGNOSIS — K219 Gastro-esophageal reflux disease without esophagitis: Secondary | ICD-10-CM | POA: Diagnosis present

## 2020-10-01 DIAGNOSIS — Z96653 Presence of artificial knee joint, bilateral: Secondary | ICD-10-CM | POA: Diagnosis present

## 2020-10-01 DIAGNOSIS — N1831 Chronic kidney disease, stage 3a: Secondary | ICD-10-CM | POA: Diagnosis present

## 2020-10-01 DIAGNOSIS — F17210 Nicotine dependence, cigarettes, uncomplicated: Secondary | ICD-10-CM | POA: Diagnosis present

## 2020-10-01 DIAGNOSIS — Z79899 Other long term (current) drug therapy: Secondary | ICD-10-CM

## 2020-10-01 DIAGNOSIS — N179 Acute kidney failure, unspecified: Secondary | ICD-10-CM

## 2020-10-01 DIAGNOSIS — L03314 Cellulitis of groin: Secondary | ICD-10-CM | POA: Diagnosis not present

## 2020-10-01 DIAGNOSIS — F32A Depression, unspecified: Secondary | ICD-10-CM | POA: Diagnosis present

## 2020-10-01 DIAGNOSIS — R748 Abnormal levels of other serum enzymes: Secondary | ICD-10-CM | POA: Diagnosis present

## 2020-10-01 DIAGNOSIS — Z23 Encounter for immunization: Secondary | ICD-10-CM | POA: Diagnosis present

## 2020-10-01 DIAGNOSIS — F1020 Alcohol dependence, uncomplicated: Secondary | ICD-10-CM

## 2020-10-01 DIAGNOSIS — Z96641 Presence of right artificial hip joint: Secondary | ICD-10-CM | POA: Diagnosis present

## 2020-10-01 DIAGNOSIS — S7291XA Unspecified fracture of right femur, initial encounter for closed fracture: Secondary | ICD-10-CM | POA: Diagnosis present

## 2020-10-01 DIAGNOSIS — Z96651 Presence of right artificial knee joint: Secondary | ICD-10-CM

## 2020-10-01 DIAGNOSIS — E785 Hyperlipidemia, unspecified: Secondary | ICD-10-CM | POA: Diagnosis present

## 2020-10-01 DIAGNOSIS — Z85828 Personal history of other malignant neoplasm of skin: Secondary | ICD-10-CM

## 2020-10-01 DIAGNOSIS — F419 Anxiety disorder, unspecified: Secondary | ICD-10-CM | POA: Diagnosis present

## 2020-10-01 LAB — URINALYSIS, COMPLETE (UACMP) WITH MICROSCOPIC
Bacteria, UA: NONE SEEN
Bilirubin Urine: NEGATIVE
Glucose, UA: 50 mg/dL — AB
Ketones, ur: NEGATIVE mg/dL
Leukocytes,Ua: NEGATIVE
Nitrite: NEGATIVE
Protein, ur: 30 mg/dL — AB
Specific Gravity, Urine: 1.012 (ref 1.005–1.030)
pH: 7 (ref 5.0–8.0)

## 2020-10-01 LAB — RESP PANEL BY RT-PCR (FLU A&B, COVID) ARPGX2
Influenza A by PCR: NEGATIVE
Influenza B by PCR: NEGATIVE
SARS Coronavirus 2 by RT PCR: NEGATIVE

## 2020-10-01 LAB — VITAMIN D 25 HYDROXY (VIT D DEFICIENCY, FRACTURES): Vit D, 25-Hydroxy: 45.91 ng/mL (ref 30–100)

## 2020-10-01 LAB — CBC WITH DIFFERENTIAL/PLATELET
Abs Immature Granulocytes: 0.08 10*3/uL — ABNORMAL HIGH (ref 0.00–0.07)
Basophils Absolute: 0 10*3/uL (ref 0.0–0.1)
Basophils Relative: 0 %
Eosinophils Absolute: 0 10*3/uL (ref 0.0–0.5)
Eosinophils Relative: 0 %
HCT: 39.2 % (ref 39.0–52.0)
Hemoglobin: 13.9 g/dL (ref 13.0–17.0)
Immature Granulocytes: 1 %
Lymphocytes Relative: 16 %
Lymphs Abs: 1.7 10*3/uL (ref 0.7–4.0)
MCH: 35.8 pg — ABNORMAL HIGH (ref 26.0–34.0)
MCHC: 35.5 g/dL (ref 30.0–36.0)
MCV: 101 fL — ABNORMAL HIGH (ref 80.0–100.0)
Monocytes Absolute: 0.9 10*3/uL (ref 0.1–1.0)
Monocytes Relative: 9 %
Neutro Abs: 7.7 10*3/uL (ref 1.7–7.7)
Neutrophils Relative %: 74 %
Platelets: 123 10*3/uL — ABNORMAL LOW (ref 150–400)
RBC: 3.88 MIL/uL — ABNORMAL LOW (ref 4.22–5.81)
RDW: 12.9 % (ref 11.5–15.5)
WBC: 10.4 10*3/uL (ref 4.0–10.5)
nRBC: 0 % (ref 0.0–0.2)

## 2020-10-01 LAB — COMPREHENSIVE METABOLIC PANEL
ALT: 18 U/L (ref 0–44)
AST: 37 U/L (ref 15–41)
Albumin: 3.5 g/dL (ref 3.5–5.0)
Alkaline Phosphatase: 84 U/L (ref 38–126)
Anion gap: 14 (ref 5–15)
BUN: 14 mg/dL (ref 8–23)
CO2: 20 mmol/L — ABNORMAL LOW (ref 22–32)
Calcium: 8.5 mg/dL — ABNORMAL LOW (ref 8.9–10.3)
Chloride: 103 mmol/L (ref 98–111)
Creatinine, Ser: 1.5 mg/dL — ABNORMAL HIGH (ref 0.61–1.24)
GFR, Estimated: 49 mL/min — ABNORMAL LOW (ref >60)
Glucose, Bld: 81 mg/dL (ref 70–99)
Potassium: 3.7 mmol/L (ref 3.5–5.1)
Sodium: 137 mmol/L (ref 135–145)
Total Bilirubin: 1.2 mg/dL (ref 0.3–1.2)
Total Protein: 6.5 g/dL (ref 6.5–8.1)

## 2020-10-01 LAB — TROPONIN I (HIGH SENSITIVITY)
Troponin I (High Sensitivity): 4 ng/L (ref <18)
Troponin I (High Sensitivity): 4 ng/L (ref <18)

## 2020-10-01 LAB — ETHANOL: Alcohol, Ethyl (B): 62 mg/dL — ABNORMAL HIGH (ref <10)

## 2020-10-01 LAB — CK: Total CK: 784 U/L — ABNORMAL HIGH (ref 49–397)

## 2020-10-01 MED ORDER — ACETAMINOPHEN 325 MG PO TABS
650.0000 mg | ORAL_TABLET | Freq: Four times a day (QID) | ORAL | Status: DC | PRN
  Administered 2020-10-03 – 2020-10-04 (×2): 650 mg via ORAL
  Filled 2020-10-01 (×2): qty 2

## 2020-10-01 MED ORDER — DOCUSATE SODIUM 100 MG PO CAPS
100.0000 mg | ORAL_CAPSULE | Freq: Two times a day (BID) | ORAL | Status: DC
  Administered 2020-10-01 – 2020-10-06 (×10): 100 mg via ORAL
  Filled 2020-10-01 (×10): qty 1

## 2020-10-01 MED ORDER — OXYCODONE HCL 5 MG PO TABS
5.0000 mg | ORAL_TABLET | Freq: Once | ORAL | Status: AC
  Administered 2020-10-01: 5 mg via ORAL
  Filled 2020-10-01: qty 1

## 2020-10-01 MED ORDER — ALBUTEROL SULFATE (2.5 MG/3ML) 0.083% IN NEBU: 3.0000 mL | INHALATION_SOLUTION | Freq: Four times a day (QID) | RESPIRATORY_TRACT | Status: DC | PRN

## 2020-10-01 MED ORDER — ENOXAPARIN SODIUM 40 MG/0.4ML IJ SOSY
40.0000 mg | PREFILLED_SYRINGE | INTRAMUSCULAR | Status: DC
  Administered 2020-10-01 – 2020-10-05 (×5): 40 mg via SUBCUTANEOUS
  Filled 2020-10-01 (×5): qty 0.4

## 2020-10-01 MED ORDER — THIAMINE HCL 100 MG PO TABS
100.0000 mg | ORAL_TABLET | Freq: Every day | ORAL | Status: DC
  Administered 2020-10-01: 100 mg via ORAL
  Filled 2020-10-01: qty 1

## 2020-10-01 MED ORDER — ONDANSETRON HCL 4 MG/2ML IJ SOLN: 4.0000 mg | Freq: Three times a day (TID) | INTRAMUSCULAR | Status: DC | PRN

## 2020-10-01 MED ORDER — ONDANSETRON HCL 4 MG/2ML IJ SOLN
4.0000 mg | Freq: Once | INTRAMUSCULAR | Status: AC
  Administered 2020-10-01: 4 mg via INTRAVENOUS
  Filled 2020-10-01: qty 2

## 2020-10-01 MED ORDER — SODIUM CHLORIDE 0.9 % IV SOLN: INTRAVENOUS | Status: AC

## 2020-10-01 MED ORDER — BUSPIRONE HCL 10 MG PO TABS
10.0000 mg | ORAL_TABLET | Freq: Two times a day (BID) | ORAL | Status: DC
  Administered 2020-10-01 – 2020-10-06 (×11): 10 mg via ORAL
  Filled 2020-10-01 (×6): qty 2
  Filled 2020-10-01 (×2): qty 1
  Filled 2020-10-01: qty 2
  Filled 2020-10-01: qty 1
  Filled 2020-10-01 (×2): qty 2
  Filled 2020-10-01: qty 1
  Filled 2020-10-01: qty 2

## 2020-10-01 MED ORDER — HYDROMORPHONE HCL 1 MG/ML IJ SOLN
0.5000 mg | Freq: Once | INTRAMUSCULAR | Status: AC
  Administered 2020-10-01: 0.5 mg via INTRAVENOUS
  Filled 2020-10-01: qty 1

## 2020-10-01 MED ORDER — LISINOPRIL 5 MG PO TABS
2.5000 mg | ORAL_TABLET | Freq: Every day | ORAL | Status: DC
  Administered 2020-10-01 – 2020-10-06 (×6): 2.5 mg via ORAL
  Filled 2020-10-01 (×6): qty 1

## 2020-10-01 MED ORDER — ADULT MULTIVITAMIN W/MINERALS CH
1.0000 | ORAL_TABLET | Freq: Every day | ORAL | Status: DC
  Administered 2020-10-01 – 2020-10-06 (×6): 1 via ORAL
  Filled 2020-10-01 (×6): qty 1

## 2020-10-01 MED ORDER — THIAMINE HCL 100 MG PO TABS
100.0000 mg | ORAL_TABLET | Freq: Every day | ORAL | Status: DC
  Administered 2020-10-02 – 2020-10-06 (×5): 100 mg via ORAL
  Filled 2020-10-01 (×5): qty 1

## 2020-10-01 MED ORDER — DOLUTEGRAVIR SODIUM 50 MG PO TABS
50.0000 mg | ORAL_TABLET | Freq: Every day | ORAL | Status: DC
  Administered 2020-10-01 – 2020-10-06 (×6): 50 mg via ORAL
  Filled 2020-10-01 (×7): qty 1

## 2020-10-01 MED ORDER — THIAMINE HCL 100 MG/ML IJ SOLN: 100.0000 mg | Freq: Every day | INTRAMUSCULAR | Status: DC

## 2020-10-01 MED ORDER — LORAZEPAM 2 MG PO TABS
0.0000 mg | ORAL_TABLET | Freq: Two times a day (BID) | ORAL | Status: AC
  Administered 2020-10-04: 2 mg via ORAL
  Filled 2020-10-01: qty 1

## 2020-10-01 MED ORDER — LORAZEPAM 1 MG PO TABS
1.0000 mg | ORAL_TABLET | ORAL | Status: AC | PRN
  Administered 2020-10-02: 2 mg via ORAL
  Administered 2020-10-04 (×2): 1 mg via ORAL
  Filled 2020-10-01 (×2): qty 1
  Filled 2020-10-01: qty 2

## 2020-10-01 MED ORDER — LORAZEPAM 2 MG PO TABS
0.0000 mg | ORAL_TABLET | Freq: Four times a day (QID) | ORAL | Status: AC
  Administered 2020-10-01: 1 mg via ORAL
  Administered 2020-10-02: 2 mg via ORAL
  Filled 2020-10-01 (×3): qty 1

## 2020-10-01 MED ORDER — ESCITALOPRAM OXALATE 10 MG PO TABS
30.0000 mg | ORAL_TABLET | Freq: Every day | ORAL | Status: DC
  Administered 2020-10-01 – 2020-10-06 (×6): 30 mg via ORAL
  Filled 2020-10-01 (×6): qty 3

## 2020-10-01 MED ORDER — POLYETHYLENE GLYCOL 3350 17 G PO PACK
17.0000 g | PACK | Freq: Every day | ORAL | Status: DC | PRN
  Administered 2020-10-02: 17 g via ORAL
  Filled 2020-10-01: qty 1

## 2020-10-01 MED ORDER — DOLUTEGRAVIR-RILPIVIRINE 50-25 MG PO TABS: 1.0000 | ORAL_TABLET | Freq: Every day | ORAL | Status: DC

## 2020-10-01 MED ORDER — RILPIVIRINE HCL 25 MG PO TABS
25.0000 mg | ORAL_TABLET | Freq: Every day | ORAL | Status: DC
  Administered 2020-10-01 – 2020-10-06 (×6): 25 mg via ORAL
  Filled 2020-10-01 (×7): qty 1

## 2020-10-01 MED ORDER — METHOCARBAMOL 1000 MG/10ML IJ SOLN
500.0000 mg | Freq: Four times a day (QID) | INTRAVENOUS | Status: DC | PRN
  Filled 2020-10-01: qty 5

## 2020-10-01 MED ORDER — TETANUS-DIPHTH-ACELL PERTUSSIS 5-2.5-18.5 LF-MCG/0.5 IM SUSY
0.5000 mL | PREFILLED_SYRINGE | Freq: Once | INTRAMUSCULAR | Status: AC
  Administered 2020-10-01: 0.5 mL via INTRAMUSCULAR
  Filled 2020-10-01: qty 0.5

## 2020-10-01 MED ORDER — HYDRALAZINE HCL 25 MG PO TABS: 25.0000 mg | ORAL_TABLET | Freq: Three times a day (TID) | ORAL | Status: DC | PRN

## 2020-10-01 MED ORDER — LORAZEPAM 2 MG/ML IJ SOLN
1.0000 mg | INTRAMUSCULAR | Status: AC | PRN
Start: 2020-10-01 — End: 2020-10-04

## 2020-10-01 MED ORDER — THIAMINE HCL 100 MG/ML IJ SOLN
100.0000 mg | Freq: Every day | INTRAMUSCULAR | Status: DC
  Filled 2020-10-01 (×2): qty 2

## 2020-10-01 MED ORDER — METHOCARBAMOL 500 MG PO TABS
500.0000 mg | ORAL_TABLET | Freq: Four times a day (QID) | ORAL | Status: DC | PRN
  Administered 2020-10-01 – 2020-10-05 (×6): 500 mg via ORAL
  Filled 2020-10-01 (×7): qty 1

## 2020-10-01 MED ORDER — HYDROCODONE-ACETAMINOPHEN 5-325 MG PO TABS
1.0000 | ORAL_TABLET | Freq: Four times a day (QID) | ORAL | Status: DC | PRN
  Administered 2020-10-01 – 2020-10-02 (×4): 2 via ORAL
  Administered 2020-10-02: 1 via ORAL
  Administered 2020-10-03: 2 via ORAL
  Administered 2020-10-03: 1 via ORAL
  Administered 2020-10-04 (×2): 2 via ORAL
  Filled 2020-10-01: qty 2
  Filled 2020-10-01: qty 1
  Filled 2020-10-01: qty 2
  Filled 2020-10-01: qty 1
  Filled 2020-10-01 (×2): qty 2
  Filled 2020-10-01: qty 1
  Filled 2020-10-01 (×3): qty 2

## 2020-10-01 MED ORDER — AMOXICILLIN-POT CLAVULANATE 875-125 MG PO TABS
1.0000 | ORAL_TABLET | Freq: Two times a day (BID) | ORAL | Status: AC
  Administered 2020-10-01 – 2020-10-02 (×3): 1 via ORAL
  Filled 2020-10-01 (×3): qty 1

## 2020-10-01 MED ORDER — SODIUM CHLORIDE 0.9 % IV BOLUS
1000.0000 mL | Freq: Once | INTRAVENOUS | Status: AC
  Administered 2020-10-01: 1000 mL via INTRAVENOUS

## 2020-10-01 MED ORDER — LORAZEPAM 2 MG/ML IJ SOLN: 0.0000 mg | Freq: Two times a day (BID) | INTRAMUSCULAR | Status: AC

## 2020-10-01 MED ORDER — FOLIC ACID 1 MG PO TABS
1.0000 mg | ORAL_TABLET | Freq: Every day | ORAL | Status: DC
  Administered 2020-10-01 – 2020-10-06 (×6): 1 mg via ORAL
  Filled 2020-10-01 (×6): qty 1

## 2020-10-01 MED ORDER — LORAZEPAM 2 MG/ML IJ SOLN
0.0000 mg | Freq: Four times a day (QID) | INTRAMUSCULAR | Status: AC
  Administered 2020-10-02: 2 mg via INTRAVENOUS

## 2020-10-01 MED ORDER — MORPHINE SULFATE (PF) 2 MG/ML IV SOLN: 0.5000 mg | INTRAVENOUS | Status: DC | PRN

## 2020-10-01 NOTE — ED Triage Notes (Signed)
Pt presents from home following a fall last night and he was able to get himself in bed - he endorses right sided knee pain and swelling. Pt is unsure if he lost consciousness. Hx of bilateral knee replacements. Pt endorses the consumption of alcohol last night. No other complaints at this time.

## 2020-10-01 NOTE — ED Provider Notes (Signed)
[facility] Emergency Department Provider Note  ____________________________________________   Event Date/Time   First MD Initiated Contact with Patient 10/01/20 0848     (approximate)  I have reviewed the triage vital signs and the nursing notes.   HISTORY  Chief Complaint Fall and Knee Pain (right)    HPI Anthony Olson. is a 75 y.o. male with HIV, CKD, alcohol abuse who comes in with concerns for fall and knee pain.  Patient reports that he had come home yesterday after a birthday party where he had drank some tequila.  He reports he does not normally drink tequila.  He states that he does not remember what happened but woke up on the ground.  He reports pain in his right knee with swelling.  Reports a prior knee replacement.  Denies any other pain in any other extremities.  Unsure if he hit his head.  States that he was able to crawl back into bed but he was on the ground for multiple hours.  He denies any chest pain, shortness of breath or other concerns.  The pain in his knee is severe, constant, worse with movement, better at rest.   On review of records patient's been seen before by cardiology for passing out.  Patient's last echocardiogram was in 2019 was normal.  Patient was placed on cardiac monitor.  This did not show any abnormalities.  However it was thought that it could be secondary to patient's alcohol abuse causing some hypotension.          Past Medical History:  Diagnosis Date   Alcohol abuse    self-reported   Anxiety    Hernia, abdominal    HIV infection ([place])    Sees Dr. 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/22/2019   History of posttraumatic stress disorder (PTSD) 12/22/2019   Cocaine use disorder, moderate, in sustained remission ([place]) 12/22/2019   Leg pain 11/10/2019   Unilateral recurrent inguinal hernia without obstruction or gangrene 10/02/2017   Syncope 09/18/2017   Monoclonal gammopathy of unknown significance (MGUS) 07/19/2016   Thrombocytopenia ([place]) 07/19/2016   Macrocytosis 03/20/2015   Kidney disease 03/20/2015   Peripheral vascular disease ([place]) 03/20/2015   Hypertensive disorder 03/20/2015   Peripheral neuritis 03/20/2015   Seborrheic dermatitis [id number]   Urinary complication, postoperative 03/20/2015   Right inguinal hernia 02/01/2015   Right hip pain 02/01/2015   H/O neoplasm 10/03/2014   History of nonmelanoma skin cancer 10/03/2014   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   Spondylosis of lumbar region without myelopathy or radiculopathy 04/25/2014   H/O total knee replacement 04/18/2014   LBP (low back pain) 03/11/2014   Acute confusion 02/17/2014   Acute renal failure ([place]) 02/17/2014   Arterial blood pressure decreased 02/17/2014   Elevated WBC count 02/17/2014   Hypotension 02/17/2014   Anxiety and depression 02/15/2014   Personal history of other infectious and parasitic diseases 02/15/2014   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   Arthritis of knee, degenerative 12/13/2013   Derangement of posterior horn of medial meniscus due to  old injury, left 12/13/2013   Primary osteoarthritis of left knee 12/13/2013   Subacromial impingement of right shoulder 08/19/2013   Acid reflux 08/02/2013   Human immunodeficiency virus (HIV) infection ([place]) 08/02/2013   Human immunodeficiency virus (HIV) disease ([place]) 08/02/2013   Biceps tendonitis on right 07/30/2013   Rotator cuff syndrome of left shoulder 07/30/2013    Past Surgical History:  Procedure Laterality Date   CARPAL TUNNEL  RELEASE Left    COLONOSCOPY WITH PROPOFOL N/A 01/20/2020   Procedure: COLONOSCOPY WITH PROPOFOL;  Surgeon: [name], MD;  Location: [facility] ENDOSCOPY;  Service: Endoscopy;  Laterality: N/A;   HERNIA REPAIR  1997   Left Lower Abdomen- [place], [place]   HERNIA REPAIR  [id number]   [place] Left 1996   [place], [place]   INGUINAL HERNIA REPAIR Right 1993   [place], [place]   JOINT REPLACEMENT     LAMINECTOMY     OPEN ANTERIOR SHOULDER RECONSTRUCTION Right 08/2013   [facility]   REPLACEMENT TOTAL KNEE Right    REPLACEMENT TOTAL KNEE Left    [facility]   UMBILICAL HERNIA REPAIR  1997   [place], [place]    Prior to Admission medications   Medication Sig Start Date End Date Taking? Authorizing Provider  amoxicillin-clavulanate (AUGMENTIN) 875-125 MG tablet Take 1 tablet by mouth 2 (two) times daily for 5 days. 09/28/20 10/03/20  Sharen Hones, MD  atorvastatin (LIPITOR) 40 MG tablet Take 40 mg by mouth at bedtime. 07/28/20   [provider]  b complex vitamins tablet Take 1 tablet by mouth daily.    [provider]  busPIRone (BUSPAR) 10 MG tablet Take 10 mg by mouth 2 (two) times daily.    [provider]  Dolutegravir-Rilpivirine 50-25 MG TABS Take 1 tablet by mouth daily.     [provider]  escitalopram (LEXAPRO) 20 MG tablet Take 30 mg by mouth daily. Takes one 10 mg and 20 mg tablets a day.    [provider]  KRILL OIL PO Take by mouth daily. Patient not taking: Reported on 09/26/2020    [provider]  lisinopril (ZESTRIL) 2.5 MG tablet Take 2.5 mg by mouth daily. 09/20/20   [provider]  Omega-3 1000 MG CAPS Take 1 capsule by mouth daily.    [provider]  PROAIR HFA 108 [phone] Base) MCG/ACT inhaler Inhale 1-2 puffs into the lungs every 6 (six) hours as needed for wheezing or shortness of breath. 08/23/19   [provider]    Allergies Nicotine  Family History  Problem Relation Age of Onset    Diabetes Mother    CAD Father 55   Schizophrenia Grandson     Social History Social History   Tobacco Use   Smoking status: Every Day    Types: Cigarettes   Smokeless tobacco: Never   Tobacco comments:    10 cigarettes per day  Vaping Use   Vaping Use: Never used  Substance Use Topics   Alcohol use: Yes    Alcohol/week: 44.0 standard drinks    Types: 44 Cans of beer per week    Comment: 6 pack a day   Drug use: Not Currently    Types: Cocaine    Comment: Use to do cocaine 30 years ago      Review of Systems Constitutional: No fever/chills, passing out Eyes: No visual changes. ENT: No sore throat. Cardiovascular: Denies chest pain. Respiratory: Denies shortness of breath. Gastrointestinal: No abdominal pain.  No nausea, no vomiting.  No diarrhea.  No constipation. Genitourinary: Negative for dysuria. Musculoskeletal: Knee pain Skin: Negative for rash. Neurological: Negative for headaches, focal weakness or numbness. All other ROS negative ____________________________________________   PHYSICAL EXAM:  VITAL SIGNS: ED Triage Vitals  Enc Vitals Group     BP 10/01/20 0844 139/90     Pulse Rate 10/01/20 0844 73     Resp 10/01/20 0844 18     Temp 10/01/20 0844 97.9 F (36.6 C)     Temp Source 10/01/20 0844 Oral     SpO2 10/01/20 0843 96 %     Weight 10/01/20 0846 182 lb 15.7 oz (83 kg)     Height 10/01/20 0846 '5\' 10"'$  (1.778 m)     Head Circumference --      Peak Flow --      Pain Score 10/01/20 0846 10     Pain Loc --      Pain Edu? --      Excl. in [place]? --     Constitutional: Alert and oriented. Well appearing and in no acute distress. Eyes: Conjunctivae are normal. EOMI. Head: Atraumatic. Nose: No congestion/rhinnorhea. Mouth/Throat: Mucous membranes are moist.   Neck: No stridor. Trachea Midline. FROM Cardiovascular: Normal rate, regular rhythm. Grossly normal heart sounds.  Good peripheral circulation.  No chest wall tenderness Respiratory:  Normal respiratory effort.  No retractions. Lungs CTAB. Gastrointestinal: Soft and nontender. No distention. No abdominal bruits.  Musculoskeletal: Swelling noted to the right knee with limited range of motion secondary to pain.  No pain in the tib-fib, ankle, foot.  No warmth or redness noted.  No other extremity tenderness Neurologic:  Normal speech and language. No gross focal neurologic deficits are appreciated.  Skin:  Skin is warm, dry and intact. No rash noted. Psychiatric: Mood and affect are normal. Speech and behavior are normal. GU: Deferred   ____________________________________________   LABS (all labs ordered are listed, but only abnormal results are displayed)  Labs Reviewed  CBC WITH DIFFERENTIAL/PLATELET - Abnormal; Notable for the following components:      Result Value   RBC 3.88 (*)    MCV 101.0 (*)    MCH 35.8 (*)    Platelets 123 (*)    Abs Immature Granulocytes 0.08 (*)    All other components within normal limits  COMPREHENSIVE METABOLIC PANEL - Abnormal; Notable for the following components:   CO2 20 (*)    Creatinine, Ser 1.50 (*)    Calcium 8.5 (*)    GFR, Estimated 49 (*)    All other components within normal limits  CK - Abnormal; Notable for the following components:   Total CK 784 (*)    All other components within normal limits  URINALYSIS, COMPLETE (UACMP) WITH MICROSCOPIC - Abnormal; Notable for the following components:   Color, Urine YELLOW (*)    APPearance CLEAR (*)    Glucose, UA 50 (*)    Hgb urine dipstick SMALL (*)    Protein, ur 30 (*)    All other components within normal limits  ETHANOL - Abnormal; Notable for the following components:   Alcohol, Ethyl (B) 62 (*)    All other components within normal limits  TROPONIN I (HIGH SENSITIVITY)  TROPONIN I (HIGH SENSITIVITY)   ____________________________________________   ED ECG REPORT I, Vanessa Baldwyn, the attending physician, personally viewed and interpreted this ECG.  Normal  sinus rate 67, no ST elevation, no T wave versions, normal intervals ____________________________________________  RADIOLOGY  Robert Bellow, personally viewed and evaluated these images (plain radiographs) as part of my medical decision making, as well as reviewing the written report by the radiologist.  ED MD interpretation: No obvious fracture on x-ray but concern for potential effusion therefore sent for CT  Official radiology report(s): CT Head Wo Contrast  Result Date: 10/01/2020 CLINICAL DATA:  75 year old male status post fall last night. Pain and swelling. EXAM: CT HEAD WITHOUT CONTRAST TECHNIQUE: Contiguous axial images were obtained from the base of the skull through the vertex without intravenous contrast. COMPARISON:  Head CT 10/14/2019. FINDINGS: Brain: Stable non contrast CT appearance of the brain. No midline shift, ventriculomegaly, mass effect, evidence of mass lesion, intracranial hemorrhage or evidence of cortically based acute infarction. Mild for age scattered white matter hypodensity. Vascular: Calcified atherosclerosis at the skull base. Chronic intracranial artery tortuosity. No suspicious intracranial vascular hyperdensity. Skull: Stable.  No acute osseous abnormality identified. Sinuses/Orbits: Visualized paranasal sinuses and mastoids are stable and well aerated. Other: Visualized orbits and scalp soft tissues are within normal limits. IMPRESSION: No acute intracranial abnormality or acute traumatic injury identified. Electronically Signed   By: Genevie Ann M.D.   On: 10/01/2020 10:05   CT Cervical Spine Wo Contrast  Result Date: 10/01/2020 CLINICAL DATA:  75 year old male status post fall last night. Pain and swelling. EXAM: CT CERVICAL SPINE WITHOUT CONTRAST TECHNIQUE: Multidetector CT imaging of the cervical spine was performed without intravenous contrast. Multiplanar CT image reconstructions were also generated. COMPARISON:  Head CT today reported separately. Cervical  spine CT 09/18/2017. FINDINGS: Alignment: Stable straightening of cervical lordosis since 2019. Cervicothoracic junction alignment is within normal limits. Stable bilateral posterior element alignment. Skull base and vertebrae: Osteopenia. Visualized skull base is intact. No atlanto-occipital dissociation. C1 and C2 appear intact and aligned. No acute osseous abnormality identified. Soft tissues and spinal canal: No prevertebral fluid or swelling. No visible canal hematoma. Stable visible noncontrast neck soft tissues. Partially retropharyngeal course of the right carotid. Disc levels: Chronic severe cervical spine degeneration appears stable since 2019, including partially calcified ligamentous hypertrophy about the odontoid. Upper chest: Stable visible upper thoracic levels. Negative lung apices. IMPRESSION: 1. No acute traumatic injury identified in the cervical spine. 2. Chronic severe cervical spine degeneration appears stable since 2019. Electronically Signed   By: Genevie Ann M.D.   On: 10/01/2020 10:08   CT PELVIS WO CONTRAST  Result Date: 10/01/2020 CLINICAL DATA:  Status post fall last night. Pelvic pain. Initial encounter. EXAM: CT OF THE BILATERAL HIPS WITHOUT CONTRAST TECHNIQUE: Multidetector CT imaging of the bilateral hips was performed according to the standard protocol. Multiplanar CT image reconstructions were also generated. COMPARISON:  Plain films pelvis and right hip earlier today. FINDINGS: Bones/Joint/Cartilage Right hip arthroplasty is in place. No hardware complication is identified. Bones appear osteopenic. No fracture or focal lesion is identified. 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 Ligaments Suboptimally assessed by CT. Muscles and Tendons Appear normal. Soft tissues Fat containing right inguinal hernia is noted. Sigmoid diverticulosis is seen. Aortic atherosclerosis also noted. The patient is status post lower lumbar surgery. IMPRESSION: No acute abnormality. Osteopenia.  Right inguinal hernia. Diverticulosis. Aortic Atherosclerosis (ICD10-I70.0). Electronically Signed   By: Inge Rise M.D.   On: 10/01/2020 11:30   CT Knee Right Wo Contrast  Result Date: 10/01/2020 CLINICAL DATA:  Right knee pain after a fall last night. History of prior knee replacement. EXAM: CT OF THE RIGHT KNEE WITHOUT CONTRAST TECHNIQUE: Multidetector CT imaging of  the right knee was performed according to the standard protocol. Multiplanar CT image reconstructions were also generated. COMPARISON:  Plain films right knee today. FINDINGS: Bones/Joint/Cartilage A total knee arthroplasty is in place and results in streak artifact on the exam. The patient has a lipohemarthrosis. No displaced fracture is identified. Subtle foci of cortical irregularity seen along the lateral metaphysis of the femur and the lateral femoral condyle. No hardware complication related to the patient's arthroplasty is seen. Bones appear osteopenic. Ligaments Suboptimally assessed by CT. Muscles and Tendons Appear intact. Soft tissues No fluid collection or mass. IMPRESSION: Lipohemarthrosis is consistent with the presence of a fracture but no displaced fracture is seen. Subtle cortical irregularity and lucency along the lateral metaphysis of the distal femur and lateral femoral condyle are worrisome for nondisplaced fractures. Right knee arthroplasty in place.  No hardware complication. Osteopenia. Electronically Signed   By: Inge Rise M.D.   On: 10/01/2020 11:16   DG Knee Complete 4 Views Right  Result Date: 10/01/2020 CLINICAL DATA:  Pain after fall EXAM: RIGHT KNEE - COMPLETE 4+ VIEW COMPARISON:  None. FINDINGS: The patient is status post knee replacement. Hardware is in good position. No fractures are identified. There is a large suprapatellar joint effusion with an associated fat fluid level consistent with a lipohemarthrosis. No other acute abnormalities. IMPRESSION: Lipohemarthrosis in the suprapatellar region  suggesting a possible underlying occult fracture even though no fracture is seen on this study. CT imaging of the knee could help to evaluate for an underlying occult fracture. Electronically Signed   By: Dorise Bullion III M.D   On: 10/01/2020 09:58   DG Hip Unilat W or Wo Pelvis 2-3 Views Right  Result Date: 10/01/2020 CLINICAL DATA:  Chronic right hip and knee pain, worse after a fall today. EXAM: DG HIP (WITH OR WITHOUT PELVIS) 2-3V RIGHT COMPARISON:  None. FINDINGS: The patient is status post right hip replacement. Hardware is in good position. No fracture or dislocation. Degenerative changes in the lumbar spine. IMPRESSION: Right hip replacement as above. Hardware is in good position. No cause for right hip pain noted. Electronically Signed   By: Dorise Bullion III M.D   On: 10/01/2020 09:51   DG Femur Min 2 Views Right  Result Date: 10/01/2020 CLINICAL DATA:  Pain after fall. EXAM: RIGHT FEMUR 2 VIEWS COMPARISON:  None. FINDINGS: The patient is status post right knee and hip replacement. There is a large suprapatellar joint effusion with an associated fat fluid level. No convincing evidence of fracture. IMPRESSION: 1. There is a large suprapatellar joint effusion in the knee with an associated fat fluid level. This finding suggests the possibility of an underlying occult fracture. No fracture seen on today's imaging. A CT scan of the knee could help to evaluate for an underlying occult fracture resulting in the lipohemarthrosis in the suprapatellar region. Electronically Signed   By: Dorise Bullion III M.D   On: 10/01/2020 09:57    ____________________________________________   PROCEDURES  Procedure(s) performed (including Critical Care):  .1-3 Lead EKG Interpretation  Date/Time: 10/01/2020 12:08 PM Performed by: Vanessa Foxworth, MD Authorized by: Vanessa [place], MD     Interpretation: normal     ECG rate:  60s   ECG rate assessment: normal     Rhythm: sinus rhythm     Ectopy: none      Conduction: normal     ____________________________________________   INITIAL IMPRESSION / ASSESSMENT AND PLAN / ED COURSE  Tennis Must.  was evaluated in Emergency Department on 10/01/2020 for the symptoms described in the history of present illness. He was evaluated in the context of the global COVID-19 pandemic, which necessitated consideration that the patient might be at risk for infection with the SARS-CoV-2 virus that causes COVID-19. Institutional protocols and algorithms that pertain to the evaluation of patients at risk for COVID-19 are in a state of rapid change based on information released by regulatory bodies including the CDC and federal and state organizations. These policies and algorithms were followed during the patient's care in the ED.    Patient is a 75 year old who comes in after possible syncopal versus blacking out from EtOH use with knee injury.  Given unclear if he hit his head will get CT head to evaluate for intercranial hemorrhage, CT cervical evaluate for cervical fracture given concern for EtOH involved.  Will get right leg x-rays to evaluate for any fractures.  Given unclear downtime will get some labs to evaluate for rhabdo, Electra abnormalities, troponin and EKG to evaluate for syncope.  We will keep patient on the cardiac monitor to evaluate for arrhythmia  Labs are reassuring.  CK only slightly elevated.  X-ray concerning for potential of fracture so CT was ordered.  CT scan shows concern for nondisplaced femur fractures.  Discussed with Dr. Mack Guise who recommends nonweightbearing and follow-up in 10 to 14 days for repeat x-ray.  Attempted to ambulate patient with crutches/walker but patient unable to tolerate.  Patient has significant pain still.  I am worried about patient going home and using alcohol and having another fall and patient is open to coming into the hospital and being placed into a facility while this is healing.  Will discuss with  hospital team for admission.  Will place on CIWA protocol.  At this time is unclear if he truly had a syncopal episode but in the past has been eval by cardiology and has had normal echo, cardiac monitoring so suspect that this is more likely related to EtOH use given his alcohol was elevated to 60 this morning multiple hours after incident           ____________________________________________   FINAL CLINICAL IMPRESSION(S) / ED DIAGNOSES   Final diagnoses:  Alcohol abuse  Closed fracture of right femur, unspecified fracture morphology, unspecified portion of femur, initial encounter ([place])  Fall, initial encounter      MEDICATIONS GIVEN DURING THIS VISIT:  Medications  HYDROmorphone (DILAUDID) injection 0.5 mg (0.5 mg Intravenous Given 10/01/20 0951)  ondansetron (ZOFRAN) injection 4 mg (4 mg Intravenous Given 10/01/20 0951)  Tdap (BOOSTRIX) injection 0.5 mL (0.5 mLs Intramuscular Given 10/01/20 0951)  sodium chloride 0.9 % bolus 1,000 mL (0 mLs Intravenous Stopped 10/01/20 1132)     ED Discharge Orders     None        Note:  This document was prepared using Dragon voice recognition software and may include unintentional dictation errors.    Vanessa Perry, MD 10/01/20 [phone]

## 2020-10-01 NOTE — ED Notes (Signed)
Pt transported to CT via stretcher at this time.  

## 2020-10-01 NOTE — Plan of Care (Signed)

## 2020-10-01 NOTE — ED Notes (Signed)
Pt transported to CT and Xray via stretcher at this time.

## 2020-10-01 NOTE — H&P (Addendum)
History and Physical    Tennis Must. [id number] DOB: Jan 09, 1946 DOA: 10/01/2020  PCP: Theotis Burrow, MD  Patient coming from: Home  I have personally briefly reviewed patient's old medical records in [place]  Chief Complaint: Right knee pain/swelling  HPI: Anthony Olson. is a 75 y.o. male with medical history significant of HIV, essential hypertension, anxiety/depression, EtOH/nicotine dependence, CKD stage IIIa, recent diagnosis cellulitis of scrotum who presented to [facility] ED on 7/31 following fall at home.  Patient reports was at a birthday party yesterday where he drank some tequila and when he returned home he did not recall etiology of his fall to the ground.  Unknown amount of time on the ground.  Lives alone.  He woke up with pain to his right knee with associated swelling.  History of prior bilateral TKA.  Patient with inability to bear weight to right lower extremity.  No other specific complaints at this time.  Denies headache, no visual changes, no chest pain, palpitations, no shortness of breath, no abdominal pain, no cough/congestion, no nausea/vomiting/diarrhea, no fever/chills/night sweats.  ED Course: Temperature 97.9 F, HR 65, RR 13, BP 145/83, SPO2 94% on room air.  Sodium 137, potassium 3.7, chloride 113, CO2 20, BUN 14, creatinine 1.50 (baseline 1.37 7/28), glucose 81.  WBC 10.4, hemoglobin 13.9, platelets 123.  CK level 784, troponin 4.  Urinalysis unrevealing.  EtOH level elevated 62.  CT head/C-spine with no acute intracranial abnormality, no C-spine fracture/subluxation.  Right knee/femur x-ray and CT right knee without contrast consistent with presence of fracture, nondisplaced.  Was evaluated by orthopedics, Dr. Mack Guise who recommended knee immobilization, nonweightbearing and outpatient follow-up in 10-14 days.  Given patient unable to ambulate, lives alone, EDP requested TRH for admission for further evaluation and treatment of right femur  fracture in the setting of acute alcohol intoxication.  Constitutional - No Fatigue, No Weight Loss Vision - No impaired vision, decreased visual acuity Ear/Nose/Mouth/Throat - No decreased hearing, no congestion Respiratory - No shortness of breath, no exertional dyspnea, chronic cough Cardiovascular - No chest pain, no palpitations, no peripheral edema Gastrointestinal - No nausea, no diarrhea, no constipation, Genitourinary - No excessive urination, no urinary incontinence Integumentary - No rashes or concerning skin lesions MSK -pain/spasms right knee with decreased range of motion due to pain Neurologic - No numbness, no tingling, no dizziness, no headaches, no confusion or memory loss  Past Medical History:  Diagnosis Date   Alcohol abuse    self-reported   Anxiety    Hernia, abdominal    HIV infection (HCC)    Sees Dr. Ola Spurr for this   HTN (hypertension)    Inguinal hernia    Kidney failure    Neoplasm of skin     Past Surgical History:  Procedure Laterality Date   CARPAL TUNNEL RELEASE Left    COLONOSCOPY WITH PROPOFOL N/A 01/20/2020   Procedure: COLONOSCOPY WITH PROPOFOL;  Surgeon: [name], MD;  Location: [facility] ENDOSCOPY;  Service: Endoscopy;  Laterality: N/A;   HERNIA REPAIR  1997   Left Lower Abdomen- [place], [place]   HERNIA REPAIR  [id number]   Supraumbilical- [place]   INGUINAL HERNIA REPAIR Left 1996   [place], [place]   INGUINAL HERNIA REPAIR Right 1993   [place]   JOINT REPLACEMENT     LAMINECTOMY     OPEN ANTERIOR SHOULDER RECONSTRUCTION Right 08/2013   [facility]   REPLACEMENT TOTAL KNEE Right    REPLACEMENT TOTAL KNEE Left  [place]   [place]     reports that he has been smoking cigarettes. He has never used smokeless tobacco. He reports current alcohol use of about 44.0 standard drinks of alcohol per week. He reports previous drug use. Drug: Cocaine.  Allergies  Allergen Reactions   Nicotine     Nicotine patches  (mild reaction)    Family History  Problem Relation Age of Onset   Diabetes Mother    CAD Father 6   Schizophrenia Grandson     Family history reviewed and not pertinent   Prior to Admission medications   Medication Sig Start Date End Date Taking? Authorizing Provider  amoxicillin-clavulanate (AUGMENTIN) 875-125 MG tablet Take 1 tablet by mouth 2 (two) times daily for 5 days. 09/28/20 10/03/20  Sharen Hones, MD  atorvastatin (LIPITOR) 40 MG tablet Take 40 mg by mouth at bedtime. 07/28/20   [provider]  b complex vitamins tablet Take 1 tablet by mouth daily.    [provider]  busPIRone (BUSPAR) 10 MG tablet Take 10 mg by mouth 2 (two) times daily.    [provider]  Dolutegravir-Rilpivirine 50-25 MG TABS Take 1 tablet by mouth daily.     [provider]  escitalopram (LEXAPRO) 20 MG tablet Take 30 mg by mouth daily. Takes one 10 mg and 20 mg tablets a day.    [provider]  KRILL OIL PO Take by mouth daily. Patient not taking: Reported on 09/26/2020    [provider]  lisinopril (ZESTRIL) 2.5 MG tablet Take 2.5 mg by mouth daily. 09/20/20   [provider]  Omega-3 1000 MG CAPS Take 1 capsule by mouth daily.    [provider]  PROAIR HFA 108 [phone] Base) MCG/ACT inhaler Inhale 1-2 puffs into the lungs every 6 (six) hours as needed for wheezing or shortness of breath. 08/23/19   [provider]    Physical Exam: Vitals:   10/01/20 1030 10/01/20 1132 10/01/20 1200 10/01/20 1230  BP: (!) 143/80 (!) 145/83 136/77 (!) 161/79  Pulse: 69 65 72 73  Resp: '13 13 16 18  '$ Temp:      TempSrc:      SpO2: 96% 94% 94% 98%  Weight:      Height:        Constitutional: NAD, calm, comfortable, appears older than stated age, chronically ill in appearance Eyes: PERRL, lids and conjunctivae normal ENMT: Mucous membranes are moist. Posterior pharynx clear of any exudate or lesions.poor dentition.  Neck: normal,  supple, no masses, no thyromegaly Respiratory: clear to auscultation bilaterally, no wheezing, no crackles. Normal respiratory effort. No accessory muscle use.  On room air Cardiovascular: Regular rate and rhythm, no murmurs / rubs / gallops. No extremity edema. 2+ pedal pulses. No carotid bruits.  Abdomen: no tenderness, no masses palpated. No hepatosplenomegaly. Bowel sounds positive.  GU: Scrotal erythema Musculoskeletal: no clubbing / cyanosis. No joint deformity upper and lower extremities. Good ROM, no contractures. Normal muscle tone.  Skin: no rashes, lesions, ulcers. No induration Neurologic: CN 2-12 grossly intact. Sensation intact, DTR normal. Strength 5/5 in all 4.  Psychiatric: Poor judgment and insight. Alert and oriented x 3. Normal mood.    Labs on Admission: I have personally reviewed following labs and imaging studies  CBC: Recent Labs  Lab 09/26/20 0910 10/01/20 0902  WBC 10.1 10.4  NEUTROABS 6.5 7.7  HGB 15.1 13.9  HCT 42.0 39.2  MCV 101.7* 101.0*  PLT 159 [id number]   Basic Metabolic Panel: Recent Labs  Lab 09/26/20 0910 09/26/20 1416 09/28/20 0417 10/01/20 0902  NA 134*  --   --  137  K 3.8  --   --  3.7  CL 100  --   --  103  CO2 23  --   --  20*  GLUCOSE 109*  --   --  81  BUN 11  --   --  14  CREATININE 1.42*  --  1.37* 1.50*  CALCIUM 8.6*  --   --  8.5*  MG  --  2.3  --   --   PHOS  --  3.3  --   --    GFR: Estimated Creatinine Clearance: 46 mL/min (A) (by C-G formula based on SCr of 1.5 mg/dL (H)). Liver Function Tests: Recent Labs  Lab 09/26/20 0910 10/01/20 0902  AST 20 37  ALT 16 18  ALKPHOS 104 84  BILITOT 0.9 1.2  PROT 6.9 6.5  ALBUMIN 3.8 3.5   No results for input(s): LIPASE, AMYLASE in the last 168 hours. No results for input(s): AMMONIA in the last 168 hours. Coagulation Profile: No results for input(s): INR, PROTIME in the last 168 hours. Cardiac Enzymes: Recent Labs  Lab 10/01/20 0902  CKTOTAL 784*   BNP (last 3  results) No results for input(s): PROBNP in the last 8760 hours. HbA1C: No results for input(s): HGBA1C in the last 72 hours. CBG: No results for input(s): GLUCAP in the last 168 hours. Lipid Profile: No results for input(s): CHOL, HDL, LDLCALC, TRIG, CHOLHDL, LDLDIRECT in the last 72 hours. Thyroid Function Tests: No results for input(s): TSH, T4TOTAL, FREET4, T3FREE, THYROIDAB in the last 72 hours. Anemia Panel: No results for input(s): VITAMINB12, FOLATE, FERRITIN, TIBC, IRON, RETICCTPCT in the last 72 hours. Urine analysis:    Component Value Date/Time   COLORURINE YELLOW (A) 10/01/2020 0902   APPEARANCEUR CLEAR (A) 10/01/2020 0902   LABSPEC 1.012 10/01/2020 0902   PHURINE 7.0 10/01/2020 0902   GLUCOSEU 50 (A) 10/01/2020 0902   HGBUR SMALL (A) 10/01/2020 0902   BILIRUBINUR NEGATIVE 10/01/2020 0902   KETONESUR NEGATIVE 10/01/2020 0902   PROTEINUR 30 (A) 10/01/2020 0902   NITRITE NEGATIVE 10/01/2020 0902   LEUKOCYTESUR NEGATIVE 10/01/2020 0902    Radiological Exams on Admission: CT Head Wo Contrast  Result Date: 10/01/2020 CLINICAL DATA:  75 year old male status post fall last night. Pain and swelling. EXAM: CT HEAD WITHOUT CONTRAST TECHNIQUE: Contiguous axial images were obtained from the base of the skull through the vertex without intravenous contrast. COMPARISON:  Head CT 10/14/2019. FINDINGS: Brain: Stable non contrast CT appearance of the brain. No midline shift, ventriculomegaly, mass effect, evidence of mass lesion, intracranial hemorrhage or evidence of cortically based acute infarction. Mild for age scattered white matter hypodensity. Vascular: Calcified atherosclerosis at the skull base. Chronic intracranial artery tortuosity. No suspicious intracranial vascular hyperdensity. Skull: Stable.  No acute osseous abnormality identified. Sinuses/Orbits: Visualized paranasal sinuses and mastoids are stable and well aerated. Other: Visualized orbits and scalp soft tissues are  within normal limits. IMPRESSION: No acute intracranial abnormality or acute traumatic injury identified. Electronically Signed   By: Genevie Ann M.D.   On: 10/01/2020 10:05   CT Cervical Spine Wo Contrast  Result Date: 10/01/2020 CLINICAL DATA:  75 year old male status post fall last night. Pain and swelling. EXAM: CT CERVICAL SPINE WITHOUT CONTRAST TECHNIQUE: Multidetector CT imaging of the cervical spine was performed without intravenous contrast. Multiplanar CT  image reconstructions were also generated. COMPARISON:  Head CT today reported separately. Cervical spine CT 09/18/2017. FINDINGS: Alignment: Stable straightening of cervical lordosis since 2019. Cervicothoracic junction alignment is within normal limits. Stable bilateral posterior element alignment. Skull base and vertebrae: Osteopenia. Visualized skull base is intact. No atlanto-occipital dissociation. C1 and C2 appear intact and aligned. No acute osseous abnormality identified. Soft tissues and spinal canal: No prevertebral fluid or swelling. No visible canal hematoma. Stable visible noncontrast neck soft tissues. Partially retropharyngeal course of the right carotid. Disc levels: Chronic severe cervical spine degeneration appears stable since 2019, including partially calcified ligamentous hypertrophy about the odontoid. Upper chest: Stable visible upper thoracic levels. Negative lung apices. IMPRESSION: 1. No acute traumatic injury identified in the cervical spine. 2. Chronic severe cervical spine degeneration appears stable since 2019. Electronically Signed   By: Genevie Ann M.D.   On: 10/01/2020 10:08   CT PELVIS WO CONTRAST  Result Date: 10/01/2020 CLINICAL DATA:  Status post fall last night. Pelvic pain. Initial encounter. EXAM: CT OF THE BILATERAL HIPS WITHOUT CONTRAST TECHNIQUE: Multidetector CT imaging of the bilateral hips was performed according to the standard protocol. Multiplanar CT image reconstructions were also generated. COMPARISON:   Plain films pelvis and right hip earlier today. FINDINGS: Bones/Joint/Cartilage Right hip arthroplasty is in place. No hardware complication is identified. Bones appear osteopenic. No fracture or focal lesion is identified. 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 Ligaments Suboptimally assessed by CT. Muscles and Tendons Appear normal. Soft tissues Fat containing right inguinal hernia is noted. Sigmoid diverticulosis is seen. Aortic atherosclerosis also noted. The patient is status post lower lumbar surgery. IMPRESSION: No acute abnormality. Osteopenia. Right inguinal hernia. Diverticulosis. Aortic Atherosclerosis (ICD10-I70.0). Electronically Signed   By: Inge Rise M.D.   On: 10/01/2020 11:30   CT Knee Right Wo Contrast  Result Date: 10/01/2020 CLINICAL DATA:  Right knee pain after a fall last night. History of prior knee replacement. EXAM: CT OF THE RIGHT KNEE WITHOUT CONTRAST TECHNIQUE: Multidetector CT imaging of the right knee was performed according to the standard protocol. Multiplanar CT image reconstructions were also generated. COMPARISON:  Plain films right knee today. FINDINGS: Bones/Joint/Cartilage A total knee arthroplasty is in place and results in streak artifact on the exam. The patient has a lipohemarthrosis. No displaced fracture is identified. Subtle foci of cortical irregularity seen along the lateral metaphysis of the femur and the lateral femoral condyle. No hardware complication related to the patient's arthroplasty is seen. Bones appear osteopenic. Ligaments Suboptimally assessed by CT. Muscles and Tendons Appear intact. Soft tissues No fluid collection or mass. IMPRESSION: Lipohemarthrosis is consistent with the presence of a fracture but no displaced fracture is seen. Subtle cortical irregularity and lucency along the lateral metaphysis of the distal femur and lateral femoral condyle are worrisome for nondisplaced fractures. Right knee arthroplasty in place.  No  hardware complication. Osteopenia. Electronically Signed   By: Inge Rise M.D.   On: 10/01/2020 11:16   DG Knee Complete 4 Views Right  Result Date: 10/01/2020 CLINICAL DATA:  Pain after fall EXAM: RIGHT KNEE - COMPLETE 4+ VIEW COMPARISON:  None. FINDINGS: The patient is status post knee replacement. Hardware is in good position. No fractures are identified. There is a large suprapatellar joint effusion with an associated fat fluid level consistent with a lipohemarthrosis. No other acute abnormalities. IMPRESSION: Lipohemarthrosis in the suprapatellar region suggesting a possible underlying occult fracture even though no fracture is seen on this study. CT imaging of  the knee could help to evaluate for an underlying occult fracture. Electronically Signed   By: Dorise Bullion III M.D   On: 10/01/2020 09:58   DG Hip Unilat W or Wo Pelvis 2-3 Views Right  Result Date: 10/01/2020 CLINICAL DATA:  Chronic right hip and knee pain, worse after a fall today. EXAM: DG HIP (WITH OR WITHOUT PELVIS) 2-3V RIGHT COMPARISON:  None. FINDINGS: The patient is status post right hip replacement. Hardware is in good position. No fracture or dislocation. Degenerative changes in the lumbar spine. IMPRESSION: Right hip replacement as above. Hardware is in good position. No cause for right hip pain noted. Electronically Signed   By: Dorise Bullion III M.D   On: 10/01/2020 09:51   DG Femur Min 2 Views Right  Result Date: 10/01/2020 CLINICAL DATA:  Pain after fall. EXAM: RIGHT FEMUR 2 VIEWS COMPARISON:  None. FINDINGS: The patient is status post right knee and hip replacement. There is a large suprapatellar joint effusion with an associated fat fluid level. No convincing evidence of fracture. IMPRESSION: 1. There is a large suprapatellar joint effusion in the knee with an associated fat fluid level. This finding suggests the possibility of an underlying occult fracture. No fracture seen on today's imaging. A CT scan of the  knee could help to evaluate for an underlying occult fracture resulting in the lipohemarthrosis in the suprapatellar region. Electronically Signed   By: Dorise Bullion III M.D   On: 10/01/2020 09:57    EKG: Independently reviewed.   Assessment/Plan Principal Problem:   Femur fracture, right (HCC) Active Problems:   H/O total knee replacement   Human immunodeficiency virus (HIV) infection (HCC)   Alcohol use disorder, severe, dependence ([place])   Right femur fracture, nondisplaced Patient presenting to ED after fall at home for unclear etiology, suspect due to acute alcohol intoxication.  When awoke, patient complaining of being able to bear weight to right lower extremity with associated right knee pain/edema.  Imaging studies suggest nondisplaced right femur fracture.  Seen by orthopedics, Dr. Astrid Divine who recommended nonsurgical treatment with knee immobilizer, PT/OT and outpatient follow-up in 10-14 days.  Patient lives alone, unlikely able to manage his ADLs given his and ability to mobilize. --Admit to inpatient, telemetry --Check vitamin D 25 --Knee immobilizer, nonweightbearing right lower extremity --PT/OT evaluation --Oxycodone as needed pain control --Robaxin as needed muscle spasms --TOC for likely need of placement  Mild rhabdomyolysis Etiology likely secondary to prolonged downtime on the floor.  CK level elevated at 784.  Denies any cardiac symptoms/chest pain.  Troponin within normal limits. --Continue IV fluids with NS at 100 MLS per hour x24 hours --Hold home statin --Repeat CK level in a.m.  Acute EtOH intoxication History of EtOH dependence Patient reports was at a birthday party, drinking tequila.  Typically drinks roughly a sixpack of beer daily.  Discussed need for complete cessation given his multiple recurrent falls.  Patient also amenable for EtOH rehab.  Denies any history of significant withdrawal symptoms or seizures. --CIWA protocol with symptom triggered  Ativan --Monitor on telemetry --TOC for substance abuse  Recent admission for scrotal cellulitis Recently discharged on 7/28 with scrotal cellulitis, received IV Zosyn while inpatient and transition to Augmentin to complete course on 10/03/2020. --Resume Augmentin to complete antibiotic course  Depression/anxiety --Lexapro 30 mg p.o. daily --BuSpar 10 mg p.o. twice daily  Essential hypertension --Continue lisinopril 2.5 mg p.o. daily --Hydralazine 25 mg p.o. every 8 hours prn SBP >180 or DBP >110  History  of HIV --Resume home medications  HLD: Hold home atorvastatin due to mild rhabdomyolysis as above  Nicotine dependence: Continues to smoke 1 pack/day.  Counseled on need for cessation.  Declines nicotine patch.    DVT prophylaxis: Lovenox Code Status: DNR Family Communication: No family present bedside Disposition Plan: Anticipate likely need for discharge to SNF Consults called: Orthopedics, Dr. Mack Guise Admission status: Inpatient Level of care: Med-Surg telemetry   Severity of Illness: The appropriate patient status for this patient is INPATIENT. Inpatient status is judged to be reasonable and necessary in order to provide the required intensity of service to ensure the patient's safety. The patient's presenting symptoms, physical exam findings, and initial radiographic and laboratory data in the context of their chronic comorbidities is felt to place them at high risk for further clinical deterioration. Furthermore, it is not anticipated that the patient will be medically stable for discharge from the hospital within 2 midnights of admission. The following factors support the patient status of inpatient.   " The patient's presenting symptoms include right knee pain/edema, inability to ambulate " The worrisome physical exam findings include decreased range of motion due to pain right knee, inability to bear weight " The initial radiographic and laboratory data are worrisome  because of nondisplaced right femur fracture " The chronic co-morbidities include EtOH dependence, essential hypertension, HIV   * I certify that at the point of admission it is my clinical judgment that the patient will require inpatient hospital care spanning beyond 2 midnights from the point of admission due to high intensity of service, high risk for further deterioration and high frequency of surveillance required.*    Daliyah Sramek J [place] DO [facility] Available via Epic secure chat 7am-7pm After these hours, please refer to coverage provider listed on [email or web address] 10/01/2020, 12:51 PM

## 2020-10-01 NOTE — ED Notes (Signed)
Knee immobilizer placed on pt. Pt unable to get up d/t pain in R knee/leg. MD notified.

## 2020-10-01 NOTE — Consult Note (Signed)
Called by Dr. Jari Pigg from the ER regarding this patient with right knee pain after a fall last night.   Patient has history of EtOH abuse per Dr. Jari Pigg and had alcohol last night.   Patient has bilateral right TKA.   Initial xrays showed no fracture but a CT of the right knee shows a non-diplaced cortical irregularity involving distal femur extending into the lateral femoral condyle.  There is no evidence for prosthetic loosening.   I recommended the patient be placed in a knee immobilizer and be NWB on the right lower extremity and follow up in the off in 10-14 days.  If the patient can't go home, he can be admitted to the hospitalist service for placement.   The patient does not need surgery for the above noted injury.

## 2020-10-01 NOTE — Consult Note (Signed)
ORTHOPAEDIC CONSULTATION  REQUESTING PHYSICIAN: [place] Eric J, DO  Chief Complaint: Right knee pain status post fall  HPI: Anthony Olson. is a 75 y.o. male resented to the ED today after a fall overnight injuring his right knee.  Patient is status post bilateral total knee arthroplasties.  He had pain in his right knee after his fall and was unable to weight-bear or ambulate..  Patient has a history of alcohol abuse reports he had tequila at a birthday party yesterday.  Patient was brought to the [facility] emergency department by EMS.  He lives alone.  Past Medical History:  Diagnosis Date   Alcohol abuse    self-reported   Anxiety    Hernia, abdominal    HIV infection (HCC)    Sees Dr. Ola Spurr for this   HTN (hypertension)    Inguinal hernia    Kidney failure    Neoplasm of skin    Past Surgical History:  Procedure Laterality Date   CARPAL TUNNEL RELEASE Left    COLONOSCOPY WITH PROPOFOL N/A 01/20/2020   Procedure: COLONOSCOPY WITH PROPOFOL;  Surgeon: [name], MD;  Location: [facility] ENDOSCOPY;  Service: Endoscopy;  Laterality: N/A;   HERNIA REPAIR  1997   Left Lower Abdomen- [place]   HERNIA REPAIR  [id number]   [place]   INGUINAL HERNIA REPAIR Left 1996   [place]   INGUINAL HERNIA REPAIR Right 1993   [place]   JOINT REPLACEMENT     LAMINECTOMY     OPEN ANTERIOR SHOULDER RECONSTRUCTION Right 08/2013   [facility]   REPLACEMENT TOTAL KNEE Right    REPLACEMENT TOTAL KNEE Left    [facility]   UMBILICAL HERNIA REPAIR  1997   [place]   Social History   Socioeconomic History   Marital status: Divorced    Spouse name: Not on file   Number of children: 1   Years of education: Not on file   Highest education level: Not on file  Occupational History   Occupation: retired  Tobacco Use   Smoking status: Every Day    Types: Cigarettes   Smokeless tobacco: Never   Tobacco comments:    10 cigarettes per day  Vaping Use   Vaping Use:  Never used  Substance and Sexual Activity   Alcohol use: Yes    Alcohol/week: 44.0 standard drinks    Types: 44 Cans of beer per week    Comment: 6 pack a day   Drug use: Not Currently    Types: Cocaine    Comment: Use to do cocaine 30 years ago   Sexual activity: Not on file  Other Topics Concern   Not on file  Social History Narrative   Not on file   Social Determinants of Health   Financial Resource Strain: Not on file  Food Insecurity: Not on file  Transportation Needs: Not on file  Physical Activity: Not on file  Stress: Not on file  Social Connections: Not on file   Family History  Problem Relation Age of Onset   Diabetes Mother    CAD Father 34   Schizophrenia Grandson    Allergies  Allergen Reactions   Nicotine     Nicotine patches (mild reaction)   Prior to Admission medications   Medication Sig Start Date End Date Taking? Authorizing Provider  atorvastatin (LIPITOR) 40 MG tablet Take 40 mg by mouth at bedtime. 07/28/20  Yes [provider]  b complex vitamins tablet Take 1 tablet  by mouth daily.   Yes [provider]  busPIRone (BUSPAR) 10 MG tablet Take 10 mg by mouth 2 (two) times daily.   Yes [provider]  Dolutegravir-Rilpivirine 50-25 MG TABS Take 1 tablet by mouth daily.    Yes [provider]  escitalopram (LEXAPRO) 20 MG tablet Take 30 mg by mouth daily. Takes one 10 mg and 20 mg tablets a day.   Yes [provider]  lisinopril (ZESTRIL) 2.5 MG tablet Take 2.5 mg by mouth daily. 09/20/20  Yes [provider]  Omega-3 1000 MG CAPS Take 1 capsule by mouth daily.   Yes [provider]  amoxicillin-clavulanate (AUGMENTIN) 875-125 MG tablet Take 1 tablet by mouth 2 (two) times daily for 5 days. 09/28/20 10/03/20  Sharen Hones, MD  KRILL OIL PO Take by mouth daily. Patient not taking: Reported on 09/26/2020    [provider]  PROAIR HFA 108 [id number] Base) MCG/ACT inhaler Inhale 1-2 puffs into  the lungs every 6 (six) hours as needed for wheezing or shortness of breath. 08/23/19   [provider]   CT Head Wo Contrast  Result Date: 10/01/2020 CLINICAL DATA:  75 year old male status post fall last night. Pain and swelling. EXAM: CT HEAD WITHOUT CONTRAST TECHNIQUE: Contiguous axial images were obtained from the base of the skull through the vertex without intravenous contrast. COMPARISON:  Head CT 10/14/2019. FINDINGS: Brain: Stable non contrast CT appearance of the brain. No midline shift, ventriculomegaly, mass effect, evidence of mass lesion, intracranial hemorrhage or evidence of cortically based acute infarction. Mild for age scattered white matter hypodensity. Vascular: Calcified atherosclerosis at the skull base. Chronic intracranial artery tortuosity. No suspicious intracranial vascular hyperdensity. Skull: Stable.  No acute osseous abnormality identified. Sinuses/Orbits: Visualized paranasal sinuses and mastoids are stable and well aerated. Other: Visualized orbits and scalp soft tissues are within normal limits. IMPRESSION: No acute intracranial abnormality or acute traumatic injury identified. Electronically Signed   By: Genevie Ann M.D.   On: 10/01/2020 10:05   CT Cervical Spine Wo Contrast  Result Date: 10/01/2020 CLINICAL DATA:  75 year old male status post fall last night. Pain and swelling. EXAM: CT CERVICAL SPINE WITHOUT CONTRAST TECHNIQUE: Multidetector CT imaging of the cervical spine was performed without intravenous contrast. Multiplanar CT image reconstructions were also generated. COMPARISON:  Head CT today reported separately. Cervical spine CT 09/18/2017. FINDINGS: Alignment: Stable straightening of cervical lordosis since 2019. Cervicothoracic junction alignment is within normal limits. Stable bilateral posterior element alignment. Skull base and vertebrae: Osteopenia. Visualized skull base is intact. No atlanto-occipital dissociation. C1 and C2 appear intact and  aligned. No acute osseous abnormality identified. Soft tissues and spinal canal: No prevertebral fluid or swelling. No visible canal hematoma. Stable visible noncontrast neck soft tissues. Partially retropharyngeal course of the right carotid. Disc levels: Chronic severe cervical spine degeneration appears stable since 2019, including partially calcified ligamentous hypertrophy about the odontoid. Upper chest: Stable visible upper thoracic levels. Negative lung apices. IMPRESSION: 1. No acute traumatic injury identified in the cervical spine. 2. Chronic severe cervical spine degeneration appears stable since 2019. Electronically Signed   By: Genevie Ann M.D.   On: 10/01/2020 10:08   CT PELVIS WO CONTRAST  Result Date: 10/01/2020 CLINICAL DATA:  Status post fall last night. Pelvic pain. Initial encounter. EXAM: CT OF THE BILATERAL HIPS WITHOUT CONTRAST TECHNIQUE: Multidetector CT imaging of the bilateral hips was performed according to the standard protocol. Multiplanar CT image reconstructions were also generated. COMPARISON:  Plain films  pelvis and right hip earlier today. FINDINGS: Bones/Joint/Cartilage Right hip arthroplasty is in place. No hardware complication is identified. Bones appear osteopenic. No fracture or focal lesion is identified. 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 Ligaments Suboptimally assessed by CT. Muscles and Tendons Appear normal. Soft tissues Fat containing right inguinal hernia is noted. Sigmoid diverticulosis is seen. Aortic atherosclerosis also noted. The patient is status post lower lumbar surgery. IMPRESSION: No acute abnormality. Osteopenia. Right inguinal hernia. Diverticulosis. Aortic Atherosclerosis (ICD10-I70.0). Electronically Signed   By: Inge Rise M.D.   On: 10/01/2020 11:30   CT Knee Right Wo Contrast  Result Date: 10/01/2020 CLINICAL DATA:  Right knee pain after a fall last night. History of prior knee replacement. EXAM: CT OF THE RIGHT KNEE WITHOUT  CONTRAST TECHNIQUE: Multidetector CT imaging of the right knee was performed according to the standard protocol. Multiplanar CT image reconstructions were also generated. COMPARISON:  Plain films right knee today. FINDINGS: Bones/Joint/Cartilage A total knee arthroplasty is in place and results in streak artifact on the exam. The patient has a lipohemarthrosis. No displaced fracture is identified. Subtle foci of cortical irregularity seen along the lateral metaphysis of the femur and the lateral femoral condyle. No hardware complication related to the patient's arthroplasty is seen. Bones appear osteopenic. Ligaments Suboptimally assessed by CT. Muscles and Tendons Appear intact. Soft tissues No fluid collection or mass. IMPRESSION: Lipohemarthrosis is consistent with the presence of a fracture but no displaced fracture is seen. Subtle cortical irregularity and lucency along the lateral metaphysis of the distal femur and lateral femoral condyle are worrisome for nondisplaced fractures. Right knee arthroplasty in place.  No hardware complication. Osteopenia. Electronically Signed   By: Inge Rise M.D.   On: 10/01/2020 11:16   DG Knee Complete 4 Views Right  Result Date: 10/01/2020 CLINICAL DATA:  Pain after fall EXAM: RIGHT KNEE - COMPLETE 4+ VIEW COMPARISON:  None. FINDINGS: The patient is status post knee replacement. Hardware is in good position. No fractures are identified. There is a large suprapatellar joint effusion with an associated fat fluid level consistent with a lipohemarthrosis. No other acute abnormalities. IMPRESSION: Lipohemarthrosis in the suprapatellar region suggesting a possible underlying occult fracture even though no fracture is seen on this study. CT imaging of the knee could help to evaluate for an underlying occult fracture. Electronically Signed   By: Dorise Bullion III M.D   On: 10/01/2020 09:58   DG Hip Unilat W or Wo Pelvis 2-3 Views Right  Result Date:  10/01/2020 CLINICAL DATA:  Chronic right hip and knee pain, worse after a fall today. EXAM: DG HIP (WITH OR WITHOUT PELVIS) 2-3V RIGHT COMPARISON:  None. FINDINGS: The patient is status post right hip replacement. Hardware is in good position. No fracture or dislocation. Degenerative changes in the lumbar spine. IMPRESSION: Right hip replacement as above. Hardware is in good position. No cause for right hip pain noted. Electronically Signed   By: Dorise Bullion III M.D   On: 10/01/2020 09:51   DG Femur Min 2 Views Right  Result Date: 10/01/2020 CLINICAL DATA:  Pain after fall. EXAM: RIGHT FEMUR 2 VIEWS COMPARISON:  None. FINDINGS: The patient is status post right knee and hip replacement. There is a large suprapatellar joint effusion with an associated fat fluid level. No convincing evidence of fracture. IMPRESSION: 1. There is a large suprapatellar joint effusion in the knee with an associated fat fluid level. This finding suggests the possibility of an underlying occult fracture. No  fracture seen on today's imaging. A CT scan of the knee could help to evaluate for an underlying occult fracture resulting in the lipohemarthrosis in the suprapatellar region. Electronically Signed   By: Dorise Bullion III M.D   On: 10/01/2020 09:57    Positive ROS: All other systems have been reviewed and were otherwise negative with the exception of those mentioned in the HPI and as above.  Physical Exam: General: Alert, no acute distress  MUSCULOSKELETAL: Right knee: Patient has intact skin overlying the right knee.  He has a large hemarthrosis with global tenderness to palpation around the right knee.  Patient can passively be brought into near full extension but cannot tolerate a flexion due to pain.  No obvious deformity other than the hemarthrosis.  He has no calf tenderness or lower leg edema.  His leg and thigh compartments are soft and compressible.  He has palpable pedal pulses and can flex and extend his toes  and dorsiflex and plantarflex his ankle.  He has slight Lee diminished sensation to light touch in the right foot than the left but is unsure whether this is his baseline.  Assessment: Right nondisplaced lateral femoral condyle fracture in the setting of total knee arthroplasty  Plan: I reviewed the patient's x-rays and CT scan.  His CT scan shows a nondisplaced fracture extending into the lateral femoral condyle.  He has a total knee arthroplasty but the femoral component does not appear to be loose despite his fracture.  I recommend nonoperative management for the patient's nondisplaced lateral femoral condyle fracture.  The patient has a knee immobilizer on his right knee.  I personally adjusted this to fit appropriately and positioned the side stays to support the knee.  I placed a pillow under his heel and lower leg to help the knee into extension.  He should ice and elevate his right lower extremity.  Patient needs to be non-weightbearing on the right lower extremity while this fracture heals and will need physical therapy evaluation tomorrow for safety.  He lives alone and may require a skilled nursing facility upon discharge, given his nonweightbearing status on the right lower extremity.  Patient understood this plan and understands his right lower extremity nonweightbearing restriction.   Thornton Park, MD    10/01/2020 4:35 PM

## 2020-10-02 DIAGNOSIS — F1010 Alcohol abuse, uncomplicated: Secondary | ICD-10-CM

## 2020-10-02 DIAGNOSIS — W19XXXA Unspecified fall, initial encounter: Secondary | ICD-10-CM

## 2020-10-02 LAB — COMPREHENSIVE METABOLIC PANEL
ALT: 15 U/L (ref 0–44)
AST: 31 U/L (ref 15–41)
Albumin: 3.1 g/dL — ABNORMAL LOW (ref 3.5–5.0)
Alkaline Phosphatase: 82 U/L (ref 38–126)
Anion gap: 6 (ref 5–15)
BUN: 17 mg/dL (ref 8–23)
CO2: 23 mmol/L (ref 22–32)
Calcium: 7.8 mg/dL — ABNORMAL LOW (ref 8.9–10.3)
Chloride: 103 mmol/L (ref 98–111)
Creatinine, Ser: 1.33 mg/dL — ABNORMAL HIGH (ref 0.61–1.24)
GFR, Estimated: 57 mL/min — ABNORMAL LOW (ref >60)
Glucose, Bld: 92 mg/dL (ref 70–99)
Potassium: 3.7 mmol/L (ref 3.5–5.1)
Sodium: 132 mmol/L — ABNORMAL LOW (ref 135–145)
Total Bilirubin: 1.8 mg/dL — ABNORMAL HIGH (ref 0.3–1.2)
Total Protein: 5.7 g/dL — ABNORMAL LOW (ref 6.5–8.1)

## 2020-10-02 LAB — CBC
HCT: 36.6 % — ABNORMAL LOW (ref 39.0–52.0)
Hemoglobin: 12.9 g/dL — ABNORMAL LOW (ref 13.0–17.0)
MCH: 36 pg — ABNORMAL HIGH (ref 26.0–34.0)
MCHC: 35.2 g/dL (ref 30.0–36.0)
MCV: 102.2 fL — ABNORMAL HIGH (ref 80.0–100.0)
Platelets: 130 10*3/uL — ABNORMAL LOW (ref 150–400)
RBC: 3.58 MIL/uL — ABNORMAL LOW (ref 4.22–5.81)
RDW: 12.7 % (ref 11.5–15.5)
WBC: 6.6 10*3/uL (ref 4.0–10.5)
nRBC: 0 % (ref 0.0–0.2)

## 2020-10-02 LAB — PHOSPHORUS: Phosphorus: 3.1 mg/dL (ref 2.5–4.6)

## 2020-10-02 LAB — MAGNESIUM: Magnesium: 2.1 mg/dL (ref 1.7–2.4)

## 2020-10-02 LAB — CK: Total CK: 322 U/L (ref 49–397)

## 2020-10-02 MED ORDER — ENSURE ENLIVE PO LIQD
237.0000 mL | Freq: Two times a day (BID) | ORAL | Status: DC
  Administered 2020-10-02 – 2020-10-06 (×9): 237 mL via ORAL

## 2020-10-02 MED ORDER — ATORVASTATIN CALCIUM 20 MG PO TABS
40.0000 mg | ORAL_TABLET | Freq: Every day | ORAL | Status: DC
  Administered 2020-10-02 – 2020-10-05 (×4): 40 mg via ORAL
  Filled 2020-10-02 (×4): qty 2

## 2020-10-02 MED ORDER — B COMPLEX-C PO TABS
1.0000 | ORAL_TABLET | Freq: Every day | ORAL | Status: DC
  Administered 2020-10-02 – 2020-10-06 (×5): 1 via ORAL
  Filled 2020-10-02 (×6): qty 1

## 2020-10-02 NOTE — Plan of Care (Signed)
Patient alert and oriented x4, continues to complain of acute pain to right lower extremity. Pain managed with prn medication, verbalized relief with re-assess. Vitals stable with no symptoms of withdrawal per CIWA assess. Bed in the lowest position, call bell within reach. Encouraged to call for assist with all transfers, bed alarm on. IV remains patent with continuous fluid. Stable condition at end of shift, will continue to monitor. Problem: Education: Goal: Knowledge of General Education information will improve Description: Including pain rating scale, medication(s)/side effects and non-pharmacologic comfort measures Outcome: Progressing   Problem: Health Behavior/Discharge Planning: Goal: Ability to manage health-related needs will improve Outcome: Progressing   Problem: Clinical Measurements: Goal: Ability to maintain clinical measurements within normal limits will improve Outcome: Progressing Goal: Will remain free from infection Outcome: Progressing Goal: Diagnostic test results will improve Outcome: Progressing Goal: Respiratory complications will improve Outcome: Progressing Goal: Cardiovascular complication will be avoided Outcome: Progressing

## 2020-10-02 NOTE — Evaluation (Signed)
Occupational Therapy Evaluation Patient Details Name: Anthony Olson. MRN: [id number] DOB: [birth year] Today's Date: 10/02/2020    History of Present Illness Anthony Olson. is a 27yoM who comes to [facility] on 10/01/20 after sustaining a fall, unknown time down, awoke to Rt knee swelling, pain, inability to bear weight. PMH: HIV, HTN, GAD/depression, ETOH, nicotine dependence, CKD3a, bilat TKA, recent scrotal cellulitis. Imaging revealing of Rt nondisplaced femur fracture extending into the lateral condyle. Orthopedics recommending conservative management with knee immobilizer, NWB. CK levels inititally elevated.   Clinical Impression   Pt was seen for OT evaluation this date. Prior to hospital admission, pt was independent, living alone, with no social supports to speak of. Pt endorses improved pain control this afternoon, however, still having 8/10 pain in RLE. Pt agreeable to perform sup>sit requiring supervision for safety. Pt set up with items to take seated sponge bath per pt's request, requiring MIN A for washing back and for BLE. MIN A for RLE mgt back to bed. Small KI replaced with larger more appropriate KI to improve knee extension and minimizing R knee ROM. Pt endorses improved comfort. Currently pt demonstrates impairments as described below (See OT problem list) which functionally limit his ability to perform ADL/self-care tasks. Pt educated in precautions and KI for RLE, educated in home/routines modifications to maximize safety/indep with ADL while maintaining NWBing on RLE. Pt would benefit from skilled OT services to address noted impairments and functional limitations (see below for any additional details) in order to maximize safety and independence while minimizing falls risk and caregiver burden. Upon hospital discharge, recommend STR to maximize pt safety and return to PLOF.     Follow Up Recommendations  SNF    Equipment Recommendations  3 in 1 bedside commode    Recommendations  for Other Services       Precautions / Restrictions Precautions Precautions: Fall Required Braces or Orthoses: Knee Immobilizer - Right Knee Immobilizer - Right: On at all times;On except when in CPM Restrictions Weight Bearing Restrictions: Yes RLE Weight Bearing: Non weight bearing      Mobility Bed Mobility Overal bed mobility: Needs Assistance Bed Mobility: Supine to Sit;Sit to Supine     Supine to sit: Supervision;HOB elevated Sit to supine: Min assist   General bed mobility comments: MIN A for RLE mgt    Transfers Overall transfer level: Needs assistance Equipment used: Rolling walker (2 wheeled) Transfers: Sit to/from [facility] Sit to Stand: Min assist Stand pivot transfers: Min guard       General transfer comment: from elevated surface can stand with monGuard assist    Balance Overall balance assessment: Needs assistance;History of Falls Sitting-balance support: No upper extremity supported Sitting balance-Leahy Scale: Good     Standing balance support: Bilateral upper extremity supported;During functional activity Standing balance-Leahy Scale: Poor                             ADL either performed or assessed with clinical judgement   ADL Overall ADL's : Needs assistance/impaired                                       General ADL Comments: Pt currently requires MIN A for LB and UB bathing and LB dressing while seated EOB. Anticipate MIN A for SPT to [facility] + RW     Vision  Baseline Vision/History: Wears glasses Wears Glasses: At all times Patient Visual Report: No change from baseline       Perception     Praxis      Pertinent Vitals/Pain Pain Assessment: 0-10 Pain Score: 8  Pain Location: Rt thigh pain, knee pain Pain Descriptors / Indicators: Aching Pain Intervention(s): Limited activity within patient's tolerance;Monitored during session;Premedicated before session;Repositioned     Hand  Dominance     Extremity/Trunk Assessment Upper Extremity Assessment Upper Extremity Assessment: Overall WFL for tasks assessed   Lower Extremity Assessment Lower Extremity Assessment: RLE deficits/detail RLE: Unable to fully assess due to immobilization;Unable to fully assess due to pain       Communication Communication Communication: No difficulties   Cognition Arousal/Alertness: Awake/alert Behavior During Therapy: WFL for tasks assessed/performed Overall Cognitive Status: Within Functional Limits for tasks assessed                                     General Comments       Exercises Other Exercises Other Exercises: Pt educated in precautions and KI for RLE, educated in home/routines modifications to maximize safety/indep with ADL while maintaining NWBing on RLE   Shoulder Instructions      Home Living Family/patient expects to be discharged to:: Private residence Living Arrangements: Alone Available Help at Discharge:  (None) Type of Home: House Home Access: Stairs to enter [employer] of Steps: 2   [place]: One level     Bathroom Shower/Tub: Tub/shower unit         [place]: Environmental consultant - 2 wheels;Cane - single point;Grab bars - tub/shower          Prior Functioning/Environment Level of Independence: Independent        Comments: Drives short distances, IND in IADLs, at least 2 falls in previous 12 months.        OT Problem List: Decreased strength;Decreased range of motion;Impaired balance (sitting and/or standing);Decreased knowledge of use of DME or AE;Decreased activity tolerance;Decreased knowledge of precautions;Pain      OT Treatment/Interventions: Self-care/ADL training;Therapeutic exercise;Therapeutic activities;DME and/or AE instruction;Patient/family education;Balance training    OT Goals(Current goals can be found in the care plan section) Acute Rehab OT Goals Patient Stated Goal: regain strength and  independence OT Goal Formulation: With patient Time For Goal Achievement: 10/16/20 Potential to Achieve Goals: Good ADL Goals Pt Will Perform Lower Body Dressing: with supervision;with set-up;sitting/lateral leans Pt Will Transfer to Toilet: with supervision;ambulating;bedside commode (RLE NWBing, LRAD) Additional ADL Goal #1: Pt will independently maintain RLE NWBing with KI donned while completing seated ADL tasks  OT Frequency: Min 1X/week   Barriers to D/C:            Co-evaluation              AM-PAC OT "6 Clicks" Daily Activity     Outcome Measure Help from another person eating meals?: None Help from another person taking care of personal grooming?: None Help from another person toileting, which includes using toliet, bedpan, or urinal?: A Lot Help from another person bathing (including washing, rinsing, drying)?: A Little Help from another person to put on and taking off regular upper body clothing?: None Help from another person to put on and taking off regular lower body clothing?: A Little 6 Click Score: 20   End of Session    Activity Tolerance: Patient tolerated treatment well Patient left: in bed;with  call bell/phone within reach;with bed alarm set  OT Visit Diagnosis: Other abnormalities of gait and mobility (R26.89);Repeated falls (R29.6);Pain Pain - Right/Left: Right Pain - part of body: Hip;Knee;Leg                Time: 1401-1430 OT Time Calculation (min): 29 min Charges:  OT General Charges $OT Visit: 1 Visit OT Evaluation $OT Eval Moderate Complexity: 1 Mod OT Treatments $Self Care/Home Management : 8-22 mins  Hanley Hays, MPH, MS, OTR/L ascom [phone] 10/02/20, 3:00 PM

## 2020-10-02 NOTE — Evaluation (Signed)
Physical Therapy Evaluation Patient Details Name: Anthony Olson. MRN: [id number] DOB: 1945-06-11 Today's Date: 10/02/2020   History of Present Illness  Anthony Olson. is a 89yoM who comes to [facility] on 10/01/20 after sustaining a fall, unknown time down, awoke to Rt knee swelling, pain, inability to bear weight. PMH: HIV, HTN, GAD/depression, ETOH, nicotine dependence, CKD3a, bilat TKA, recent scrotal cellulitis. Imaging revealing of Rt nondisplaced femur fracture extending into the lateral condyle. Orthopedics recommending conservative management with knee immobilizer, NWB. CK levels inititally elevated.  Clinical Impression  Pt admitted with above diagnosis. Pt currently with functional limitations due to the deficits listed below (see "PT Problem List"). Upon entry, pt in bed, awake and agreeable to participate, but having a lot of pain (meds recently received). The pt is alert, pleasant, interactive, and able to provide info regarding prior level of function, both in tolerance and independence. MinA to EOB, mostly for support of RLE due to ineffective knee immobilizer. MinA to rise to standing, but able with minGuard assist from elevated surface. Pt able to safely pivot to recliner with RW, struggles to maintain NWB, a few instances of TDWB. Pt not appropriate to attempt AMB at this time given pain and inability to remain NWB. Patient's performance this date reveals decreased ability, independence, and tolerance in performing all basic mobility required for performance of activities of daily living. Pt requires additional DME, close physical assistance, and cues for safe participate in mobility. Pt will benefit from skilled PT intervention to increase independence and safety with basic mobility in preparation for discharge to the venue listed below. Pt lives alone, has no social support, would do well at STR to improve independence with basic mobility while he prepares to return to living at home.        Follow Up Recommendations SNF;Supervision for mobility/OOB    Equipment Recommendations  None recommended by PT (has a RW at home)    Recommendations for Other Services       Precautions / Restrictions Precautions Precautions: Fall Restrictions RLE Weight Bearing: Non weight bearing      Mobility  Bed Mobility Overal bed mobility: Needs Assistance Bed Mobility: Supine to Sit     Supine to sit: Supervision          Transfers Overall transfer level: Needs assistance Equipment used: Rolling walker (2 wheeled) Transfers: Sit to/from [facility] Sit to Stand: Min assist Stand pivot transfers: Min guard       General transfer comment: from elevated surface can stand with monGuard assist  Ambulation/Gait Ambulation/Gait assistance:  (pain too elevated, just focused on pivot transfer to chair)              Stairs            Wheelchair Mobility    Modified Rankin (Stroke Patients Only)       Balance Overall balance assessment: Needs assistance;History of Falls Sitting-balance support: No upper extremity supported Sitting balance-Leahy Scale: Good     Standing balance support: Bilateral upper extremity supported;During functional activity Standing balance-Leahy Scale: Poor                               Pertinent Vitals/Pain Pain Assessment: 0-10 Pain Score: 9  Pain Location: Rt thigh pain, knee pain Pain Descriptors / Indicators: Aching Pain Intervention(s): Limited activity within patient's tolerance;Monitored during session;Premedicated before session    Home Living Family/patient expects to be discharged  to:: Private residence Living Arrangements: Alone Available Help at Discharge:  (None) Type of Home: House Home Access: Stairs to enter   [employer] of Steps: 2 Home Layout: One [place]: [place] - 2 wheels;Cane - single point;Grab bars - tub/shower      Prior Function Level of  Independence: Independent         Comments: Drives short distances, IND in IADLs, at least 2 falls in previous 12 months     Hand Dominance        Extremity/Trunk Assessment   Upper Extremity Assessment Upper Extremity Assessment: Overall WFL for tasks assessed    Lower Extremity Assessment Lower Extremity Assessment: Overall WFL for tasks assessed       Communication   Communication: No difficulties  Cognition Arousal/Alertness: Awake/alert Behavior During Therapy: WFL for tasks assessed/performed Overall Cognitive Status: Within Functional Limits for tasks assessed                                        General Comments      Exercises     Assessment/Plan    PT Assessment Patient needs continued PT services  PT Problem List Decreased strength;Decreased activity tolerance;Decreased balance;Decreased mobility;Decreased coordination;Decreased knowledge of use of DME;Decreased safety awareness       PT Treatment Interventions DME instruction;Balance training;Gait training;Stair training;Functional mobility training;Therapeutic activities;Therapeutic exercise;Patient/family education    PT Goals (Current goals can be found in the Care Plan section)  Acute Rehab PT Goals Patient Stated Goal: regain strength and independence PT Goal Formulation: With patient Time For Goal Achievement: 10/16/20 Potential to Achieve Goals: Good    Frequency 7X/week   Barriers to discharge Inaccessible home environment;Decreased caregiver support      Co-evaluation               AM-PAC PT "6 Clicks" Mobility  Outcome Measure Help needed turning from your back to your side while in a flat bed without using bedrails?: A Little Help needed moving from lying on your back to sitting on the side of a flat bed without using bedrails?: A Little Help needed moving to and from a bed to a chair (including a wheelchair)?: A Lot Help needed standing up from a chair  using your arms (e.g., wheelchair or bedside chair)?: A Lot Help needed to walk in hospital room?: A Lot Help needed climbing 3-5 steps with a railing? : A Lot 6 Click Score: 14    End of Session   Activity Tolerance: Patient tolerated treatment well;Patient limited by pain Patient left: with call bell/phone within reach;in chair;with chair alarm set Nurse Communication: Mobility status PT Visit Diagnosis: Other abnormalities of gait and mobility (R26.89);Repeated falls (R29.6);Muscle weakness (generalized) (M62.81);History of falling (Z91.81);Difficulty in walking, not elsewhere classified (R26.2)    Time: [id number] PT Time Calculation (min) (ACUTE ONLY): 25 min   Charges:   PT Evaluation $PT Eval Moderate Complexity: 1 Mod         12:45 PM, 10/02/20 Etta Grandchild, PT, DPT Physical Therapist - [facility]  [phone] [facility]) ;  Nabria Nevin C 10/02/2020, 12:40 PM

## 2020-10-02 NOTE — Progress Notes (Signed)
PROGRESS NOTE    Tennis Must.  WG:[id number] DOB: February 11, 1946 DOA: 10/01/2020 PCP: Theotis Burrow, MD   Brief Narrative: Taken from H&P.  Anthony Olson. is a 75 y.o. male with medical history significant of HIV, essential hypertension, anxiety/depression, EtOH/nicotine dependence, CKD stage IIIa, recent diagnosis cellulitis of scrotum who presented to [facility] ED on 7/31 following fall at home.  Patient reports was at a birthday party yesterday where he drank some tequila and when he returned home he did not recall etiology of his fall to the ground.  Unknown amount of time on the ground.  Lives alone.  He woke up with pain to his right knee with associated swelling.   Imaging concerning for nondisplaced femoral condyle fracture. Labs pertinent for mildly elevated CK which has been normalized.  EtOH level elevated at 62. Orthopedic was consulted and they are recommending conservative management without any surgical intervention with pain management, immobilization and nonweightbearing right lower extremity. PT is recommending SNF-TOC is working on it  Subjective: Patient was sitting comfortably in chair when seen today.  Complaining of some lateral sided thigh pain.  Patient drinks only daily basis, denies any prior withdrawal symptoms.  Assessment & Plan:   Principal Problem:   Femur fracture, right ([place]) Active Problems:   H/O total knee replacement   Human immunodeficiency virus (HIV) infection (HCC)   Alcohol use disorder, severe, dependence ([place])  Right femur fracture, nondisplaced Patient presenting to ED after fall at home for unclear etiology, suspect due to acute alcohol intoxication.  Orthopedic is recommending nonsurgical, conservative management and he will remain with knee immobilizer and nonweightbearing on right lower extremity and will follow-up with them as an outpatient.  Vitamin D level within normal limit PT is recommending SNF -TOC to look for  placement -Continue with pain management -Current IMU with supportive care  Mild rhabdomyolysis.  Most likely secondary to lying on floor for unknown time.  CK has been normalized. -Discontinue IV fluid -Can restart home statin  Acute EtOH intoxication/History of EtOH dependence Patient reports was at a birthday party, drinking tequila.  Typically drinks roughly a sixpack of beer daily.  Discussed need for complete cessation given his multiple recurrent falls.  Patient also amenable for EtOH rehab.  Denies any history of significant withdrawal symptoms or seizures. -Continue with CIWA protocol -Patient was counseled again-we will need persistent counseling.  Recent admission for scrotal cellulitis Recently discharged on 7/28 with scrotal cellulitis, received IV Zosyn while inpatient and transition to Augmentin to complete course on 10/03/2020. --Resume Augmentin to complete antibiotic course   Depression/anxiety --Lexapro 30 mg p.o. daily --BuSpar 10 mg p.o. twice daily   Essential hypertension --Continue lisinopril 2.5 mg p.o. daily --Hydralazine 25 mg p.o. every 8 hours prn SBP >180 or DBP >110   History of HIV --Resume home medications   HLD: Initially home statin was held due to elevated CK -Can restart home statin  Nicotine dependence: Continues to smoke 1 pack/day.  Counseled on need for cessation.  Declines nicotine patch.  Objective: Vitals:   10/02/20 0000 10/02/20 0427 10/02/20 0638 10/02/20 0745  BP: 127/73 127/75 120/70 (!) 132/91  Pulse: 63 60 68 60  Resp:  19  15  Temp:  98.1 F (36.7 C)  98.3 F (36.8 C)  TempSrc:      SpO2:  96%  98%  Weight:      Height:        Intake/Output Summary (Last 24 hours) at  10/02/2020 1438 Last data filed at 10/02/2020 1000 Gross per 24 hour  Intake 878.22 ml  Output 1200 ml  Net -321.78 ml   Filed Weights   10/01/20 0846  Weight: 83 kg    Examination:  General exam: Appears calm and comfortable  Respiratory  system: Clear to auscultation. Respiratory effort normal. Cardiovascular system: S1 & S2 heard, RRR. No JVD, murmurs, rubs, gallops or clicks. Gastrointestinal system: Soft, nontender, nondistended, bowel sounds positive. Central nervous system: Alert and oriented. No focal neurological deficits. Extremities: No edema, no cyanosis, pulses intact and symmetrical. Psychiatry: Judgement and insight appear normal.   DVT prophylaxis: Lovenox Code Status: DNR Family Communication: Discussed with patient, called son with no response. Disposition Plan:  Status is: Inpatient  Remains inpatient appropriate because:Inpatient level of care appropriate due to severity of illness  Dispo: The patient is from: Home              Anticipated d/c is to: SNF              Patient currently is medically stable to d/c.   Difficult to place patient No              Level of care: Med-Surg  All the records are reviewed and case discussed with Care Management/Social Worker. Management plans discussed with the patient, nursing and they are in agreement.  Consultants:  Orthopedic surgery  Procedures:  Antimicrobials:   Data Reviewed: I have personally reviewed following labs and imaging studies  CBC: Recent Labs  Lab 09/26/20 0910 10/01/20 0902 10/02/20 0417  WBC 10.1 10.4 6.6  NEUTROABS 6.5 7.7  --   HGB 15.1 13.9 12.9*  HCT 42.0 39.2 36.6*  MCV 101.7* 101.0* 102.2*  PLT 159 123* [id number]   Basic Metabolic Panel: Recent Labs  Lab 09/26/20 0910 09/26/20 1416 09/28/20 0417 10/01/20 0902 10/02/20 0417  NA 134*  --   --  137 132*  K 3.8  --   --  3.7 3.7  CL 100  --   --  103 103  CO2 23  --   --  20* 23  GLUCOSE 109*  --   --  81 92  BUN 11  --   --  14 17  CREATININE 1.42*  --  1.37* 1.50* 1.33*  CALCIUM 8.6*  --   --  8.5* 7.8*  MG  --  2.3  --   --  2.1  PHOS  --  3.3  --   --  3.1   GFR: Estimated Creatinine Clearance: 51.8 mL/min (A) (by C-G formula based on SCr of 1.33 mg/dL  (H)). Liver Function Tests: Recent Labs  Lab 09/26/20 0910 10/01/20 0902 10/02/20 0417  AST 20 37 31  ALT '16 18 15  '$ ALKPHOS 104 84 82  BILITOT 0.9 1.2 1.8*  PROT 6.9 6.5 5.7*  ALBUMIN 3.8 3.5 3.1*   No results for input(s): LIPASE, AMYLASE in the last 168 hours. No results for input(s): AMMONIA in the last 168 hours. Coagulation Profile: No results for input(s): INR, PROTIME in the last 168 hours. Cardiac Enzymes: Recent Labs  Lab 10/01/20 0902 10/02/20 0417  CKTOTAL 784* 322   BNP (last 3 results) No results for input(s): PROBNP in the last 8760 hours. HbA1C: No results for input(s): HGBA1C in the last 72 hours. CBG: No results for input(s): GLUCAP in the last 168 hours. Lipid Profile: No results for input(s): CHOL, HDL, LDLCALC, TRIG, CHOLHDL, LDLDIRECT in the last 72  hours. Thyroid Function Tests: No results for input(s): TSH, T4TOTAL, FREET4, T3FREE, THYROIDAB in the last 72 hours. Anemia Panel: No results for input(s): VITAMINB12, FOLATE, FERRITIN, TIBC, IRON, RETICCTPCT in the last 72 hours. Sepsis Labs: Recent Labs  Lab 09/26/20 1150  LATICACIDVEN 2.1*    Recent Results (from the past 240 hour(s))  Culture, blood (Routine X 2) w Reflex to ID Panel     Status: None (Preliminary result)   Collection Time: 09/26/20 11:35 AM   Specimen: BLOOD  Result Value Ref Range Status   Specimen Description BLOOD  LEFT FOREARM  Final   Special Requests   Final    BOTTLES DRAWN AEROBIC AND ANAEROBIC Blood Culture adequate volume   Culture   Final    NO GROWTH 3 DAYS Performed at [facility], [address], [place], [place] [address]    Report Status PENDING  Incomplete  Culture, blood (Routine X 2) w Reflex to ID Panel     Status: None (Preliminary result)   Collection Time: 09/26/20 11:45 AM   Specimen: BLOOD  Result Value Ref Range Status   Specimen Description BLOOD  LEFT AC  Final   Special Requests   Final    BOTTLES DRAWN AEROBIC AND ANAEROBIC  Blood Culture adequate volume   Culture   Final    NO GROWTH 3 DAYS Performed at [facility], [address], [place], [place] [address]    Report Status PENDING  Incomplete  Resp Panel by RT-PCR (Flu A&B, Covid) Nasopharyngeal Swab     Status: None   Collection Time: 09/26/20  2:16 PM   Specimen: Nasopharyngeal Swab; Nasopharyngeal(NP) swabs in vial transport medium  Result Value Ref Range Status   SARS Coronavirus 2 by RT PCR NEGATIVE NEGATIVE Final    Comment: (NOTE) SARS-CoV-2 target nucleic acids are NOT DETECTED.  The SARS-CoV-2 RNA is generally detectable in upper respiratory specimens during the acute phase of infection. The lowest concentration of SARS-CoV-2 viral copies this assay can detect is 138 copies/mL. A negative result does not preclude SARS-Cov-2 infection and should not be used as the sole basis for treatment or other patient management decisions. A negative result may occur with  improper specimen collection/handling, submission of specimen other than nasopharyngeal swab, presence of viral mutation(s) within the areas targeted by this assay, and inadequate number of viral copies(<138 copies/mL). A negative result must be combined with clinical observations, patient history, and epidemiological information. The expected result is Negative.  Fact Sheet for Patients:  EntrepreneurPulse.com.au  Fact Sheet for Healthcare Providers:  IncredibleEmployment.be  This test is no t yet approved or cleared by the [place] FDA and  has been authorized for detection and/or diagnosis of SARS-CoV-2 by FDA under an Emergency Use Authorization (EUA). This EUA will remain  in effect (meaning this test can be used) for the duration of the COVID-19 declaration under Section 564(b)(1) of the Act, 21 U.S.C.section 360bbb-3(b)(1), unless the authorization is terminated  or revoked sooner.       Influenza A by PCR NEGATIVE  NEGATIVE Final   Influenza B by PCR NEGATIVE NEGATIVE Final    Comment: (NOTE) The Xpert Xpress SARS-CoV-2/FLU/RSV plus assay is intended as an aid in the diagnosis of influenza from Nasopharyngeal swab specimens and should not be used as a sole basis for treatment. Nasal washings and aspirates are unacceptable for Xpert Xpress SARS-CoV-2/FLU/RSV testing.  Fact Sheet for Patients: EntrepreneurPulse.com.au  Fact Sheet for Healthcare Providers: IncredibleEmployment.be  This test is  not yet approved or cleared by the [place] and has been authorized for detection and/or diagnosis of SARS-CoV-2 by FDA under an Emergency Use Authorization (EUA). This EUA will remain in effect (meaning this test can be used) for the duration of the COVID-19 declaration under Section 564(b)(1) of the Act, 21 U.S.C. section 360bbb-3(b)(1), unless the authorization is terminated or revoked.  Performed at [facility], [place], [place] [address]   Resp Panel by RT-PCR (Flu A&B, Covid) Nasopharyngeal Swab     Status: None   Collection Time: 10/01/20 12:25 PM   Specimen: Nasopharyngeal Swab; Nasopharyngeal(NP) swabs in vial transport medium  Result Value Ref Range Status   SARS Coronavirus 2 by RT PCR NEGATIVE NEGATIVE Final    Comment: (NOTE) SARS-CoV-2 target nucleic acids are NOT DETECTED.  The SARS-CoV-2 RNA is generally detectable in upper respiratory specimens during the acute phase of infection. The lowest concentration of SARS-CoV-2 viral copies this assay can detect is 138 copies/mL. A negative result does not preclude SARS-Cov-2 infection and should not be used as the sole basis for treatment or other patient management decisions. A negative result may occur with  improper specimen collection/handling, submission of specimen other than nasopharyngeal swab, presence of viral mutation(s) within the areas targeted by this  assay, and inadequate number of viral copies(<138 copies/mL). A negative result must be combined with clinical observations, patient history, and epidemiological information. The expected result is Negative.  Fact Sheet for Patients:  EntrepreneurPulse.com.au  Fact Sheet for Healthcare Providers:  IncredibleEmployment.be  This test is no t yet approved or cleared by the [place] FDA and  has been authorized for detection and/or diagnosis of SARS-CoV-2 by FDA under an Emergency Use Authorization (EUA). This EUA will remain  in effect (meaning this test can be used) for the duration of the COVID-19 declaration under Section 564(b)(1) of the Act, 21 U.S.C.section 360bbb-3(b)(1), unless the authorization is terminated  or revoked sooner.       Influenza A by PCR NEGATIVE NEGATIVE Final   Influenza B by PCR NEGATIVE NEGATIVE Final    Comment: (NOTE) The Xpert Xpress SARS-CoV-2/FLU/RSV plus assay is intended as an aid in the diagnosis of influenza from Nasopharyngeal swab specimens and should not be used as a sole basis for treatment. Nasal washings and aspirates are unacceptable for Xpert Xpress SARS-CoV-2/FLU/RSV testing.  Fact Sheet for Patients: EntrepreneurPulse.com.au  Fact Sheet for Healthcare Providers: IncredibleEmployment.be  This test is not yet approved or cleared by the [place] FDA and has been authorized for detection and/or diagnosis of SARS-CoV-2 by FDA under an Emergency Use Authorization (EUA). This EUA will remain in effect (meaning this test can be used) for the duration of the COVID-19 declaration under Section 564(b)(1) of the Act, 21 U.S.C. section 360bbb-3(b)(1), unless the authorization is terminated or revoked.  Performed at [facility], [address] [place], [place] [address]      Radiology Studies: CT Head Wo Contrast  Result Date:  10/01/2020 CLINICAL DATA:  75 year old male status post fall last night. Pain and swelling. EXAM: CT HEAD WITHOUT CONTRAST TECHNIQUE: Contiguous axial images were obtained from the base of the skull through the vertex without intravenous contrast. COMPARISON:  Head CT 10/14/2019. FINDINGS: Brain: Stable non contrast CT appearance of the brain. No midline shift, ventriculomegaly, mass effect, evidence of mass lesion, intracranial hemorrhage or evidence of cortically based acute infarction. Mild for age scattered white matter hypodensity. Vascular: Calcified atherosclerosis at the skull  base. Chronic intracranial artery tortuosity. No suspicious intracranial vascular hyperdensity. Skull: Stable.  No acute osseous abnormality identified. Sinuses/Orbits: Visualized paranasal sinuses and mastoids are stable and well aerated. Other: Visualized orbits and scalp soft tissues are within normal limits. IMPRESSION: No acute intracranial abnormality or acute traumatic injury identified. Electronically Signed   By: Genevie Ann M.D.   On: 10/01/2020 10:05   CT Cervical Spine Wo Contrast  Result Date: 10/01/2020 CLINICAL DATA:  75 year old male status post fall last night. Pain and swelling. EXAM: CT CERVICAL SPINE WITHOUT CONTRAST TECHNIQUE: Multidetector CT imaging of the cervical spine was performed without intravenous contrast. Multiplanar CT image reconstructions were also generated. COMPARISON:  Head CT today reported separately. Cervical spine CT 09/18/2017. FINDINGS: Alignment: Stable straightening of cervical lordosis since 2019. Cervicothoracic junction alignment is within normal limits. Stable bilateral posterior element alignment. Skull base and vertebrae: Osteopenia. Visualized skull base is intact. No atlanto-occipital dissociation. C1 and C2 appear intact and aligned. No acute osseous abnormality identified. Soft tissues and spinal canal: No prevertebral fluid or swelling. No visible canal hematoma. Stable visible  noncontrast neck soft tissues. Partially retropharyngeal course of the right carotid. Disc levels: Chronic severe cervical spine degeneration appears stable since 2019, including partially calcified ligamentous hypertrophy about the odontoid. Upper chest: Stable visible upper thoracic levels. Negative lung apices. IMPRESSION: 1. No acute traumatic injury identified in the cervical spine. 2. Chronic severe cervical spine degeneration appears stable since 2019. Electronically Signed   By: Genevie Ann M.D.   On: 10/01/2020 10:08   CT PELVIS WO CONTRAST  Result Date: 10/01/2020 CLINICAL DATA:  Status post fall last night. Pelvic pain. Initial encounter. EXAM: CT OF THE BILATERAL HIPS WITHOUT CONTRAST TECHNIQUE: Multidetector CT imaging of the bilateral hips was performed according to the standard protocol. Multiplanar CT image reconstructions were also generated. COMPARISON:  Plain films pelvis and right hip earlier today. FINDINGS: Bones/Joint/Cartilage Right hip arthroplasty is in place. No hardware complication is identified. Bones appear osteopenic. No fracture or focal lesion is identified. 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 Ligaments Suboptimally assessed by CT. Muscles and Tendons Appear normal. Soft tissues Fat containing right inguinal hernia is noted. Sigmoid diverticulosis is seen. Aortic atherosclerosis also noted. The patient is status post lower lumbar surgery. IMPRESSION: No acute abnormality. Osteopenia. Right inguinal hernia. Diverticulosis. Aortic Atherosclerosis (ICD10-I70.0). Electronically Signed   By: Inge Rise M.D.   On: 10/01/2020 11:30   CT Knee Right Wo Contrast  Result Date: 10/01/2020 CLINICAL DATA:  Right knee pain after a fall last night. History of prior knee replacement. EXAM: CT OF THE RIGHT KNEE WITHOUT CONTRAST TECHNIQUE: Multidetector CT imaging of the right knee was performed according to the standard protocol. Multiplanar CT image reconstructions were also  generated. COMPARISON:  Plain films right knee today. FINDINGS: Bones/Joint/Cartilage A total knee arthroplasty is in place and results in streak artifact on the exam. The patient has a lipohemarthrosis. No displaced fracture is identified. Subtle foci of cortical irregularity seen along the lateral metaphysis of the femur and the lateral femoral condyle. No hardware complication related to the patient's arthroplasty is seen. Bones appear osteopenic. Ligaments Suboptimally assessed by CT. Muscles and Tendons Appear intact. Soft tissues No fluid collection or mass. IMPRESSION: Lipohemarthrosis is consistent with the presence of a fracture but no displaced fracture is seen. Subtle cortical irregularity and lucency along the lateral metaphysis of the distal femur and lateral femoral condyle are worrisome for nondisplaced fractures. Right knee arthroplasty in place.  No hardware complication. Osteopenia. Electronically Signed   By: Inge Rise M.D.   On: 10/01/2020 11:16   DG Knee Complete 4 Views Right  Result Date: 10/01/2020 CLINICAL DATA:  Pain after fall EXAM: RIGHT KNEE - COMPLETE 4+ VIEW COMPARISON:  None. FINDINGS: The patient is status post knee replacement. Hardware is in good position. No fractures are identified. There is a large suprapatellar joint effusion with an associated fat fluid level consistent with a lipohemarthrosis. No other acute abnormalities. IMPRESSION: Lipohemarthrosis in the suprapatellar region suggesting a possible underlying occult fracture even though no fracture is seen on this study. CT imaging of the knee could help to evaluate for an underlying occult fracture. Electronically Signed   By: Dorise Bullion III M.D   On: 10/01/2020 09:58   DG Hip Unilat W or Wo Pelvis 2-3 Views Right  Result Date: 10/01/2020 CLINICAL DATA:  Chronic right hip and knee pain, worse after a fall today. EXAM: DG HIP (WITH OR WITHOUT PELVIS) 2-3V RIGHT COMPARISON:  None. FINDINGS: The patient is  status post right hip replacement. Hardware is in good position. No fracture or dislocation. Degenerative changes in the lumbar spine. IMPRESSION: Right hip replacement as above. Hardware is in good position. No cause for right hip pain noted. Electronically Signed   By: Dorise Bullion III M.D   On: 10/01/2020 09:51   DG Femur Min 2 Views Right  Result Date: 10/01/2020 CLINICAL DATA:  Pain after fall. EXAM: RIGHT FEMUR 2 VIEWS COMPARISON:  None. FINDINGS: The patient is status post right knee and hip replacement. There is a large suprapatellar joint effusion with an associated fat fluid level. No convincing evidence of fracture. IMPRESSION: 1. There is a large suprapatellar joint effusion in the knee with an associated fat fluid level. This finding suggests the possibility of an underlying occult fracture. No fracture seen on today's imaging. A CT scan of the knee could help to evaluate for an underlying occult fracture resulting in the lipohemarthrosis in the suprapatellar region. Electronically Signed   By: Dorise Bullion III M.D   On: 10/01/2020 09:57    Scheduled Meds:  busPIRone  10 mg Oral BID   docusate sodium  100 mg Oral BID   dolutegravir  50 mg Oral QAC breakfast   And   rilpivirine  25 mg Oral Q breakfast   enoxaparin (LOVENOX) injection  40 mg Subcutaneous Q24H   escitalopram  30 mg Oral Daily   folic acid  1 mg Oral Daily   lisinopril  2.5 mg Oral Daily   LORazepam  0-4 mg Intravenous Q6H   Or   LORazepam  0-4 mg Oral Q6H   [START ON 10/03/2020] LORazepam  0-4 mg Intravenous Q12H   Or   [START ON 10/03/2020] LORazepam  0-4 mg Oral Q12H   multivitamin with minerals  1 tablet Oral Daily   thiamine  100 mg Oral Daily   Or   thiamine  100 mg Intravenous Daily   Continuous Infusions:  methocarbamol (ROBAXIN) IV       LOS: 1 day   Time spent: 38 minutes. More than 50% of the time was spent in counseling/coordination of care  Lorella Nimrod, MD [facility]  If  7PM-7AM, please contact night-coverage [email or web address]  10/02/2020, 2:38 PM   This record has been created using Systems analyst. Errors have been sought and corrected,but may not always be located. Such creation errors do not reflect on the standard of care.

## 2020-10-02 NOTE — TOC Progression Note (Signed)
Transition of Care (TOC) - Progression Note    Patient Details  Name: Anthony Olson. MRN: [id number] Date of Birth: [birth year]  Transition of Care [facility]) CM/SW Contact  Su Hilt, RN Phone Number: 10/02/2020, 4:50 PM  Clinical Narrative:     Met with the patient to discuss DC plan and needs He is agreeable [facility], he has had 2 covid vaccines and 2 boosters,        Expected Discharge Plan and Services                                                 Social Determinants of Health (SDOH) Interventions    Readmission Risk Interventions No flowsheet data found.

## 2020-10-02 NOTE — Progress Notes (Signed)
Initial Nutrition Assessment  DOCUMENTATION CODES:  Non-severe (moderate) malnutrition in context of social or environmental circumstances  INTERVENTION:  Continue current diet as ordered Ensure Enlive po BID, each supplement provides 350 kcal and 20 grams of protein Continue vitamin regimen for hx of EtOH abuse  NUTRITION DIAGNOSIS:  Moderate Malnutrition related to social / environmental circumstances (excessive EtOH intake) as evidenced by mild fat depletion, severe fat depletion, mild muscle depletion.  GOAL:  Patient will meet greater than or equal to 90% of their needs  MONITOR:  PO intake, Supplement acceptance  REASON FOR ASSESSMENT:  Consult Hip fracture protocol  ASSESSMENT:  75 y.o. male with medical history significant for HIV, HTN, anxiety, alcohol abuse, and nicotine dependence presented to ED after a fall at home the night before. Reports pain to his knee.  Orthopedics reviewed imaging and determined CT of the right knee showed a non-diplaced cortical irregularity involving distal femur extending into the lateral femoral condyle. Recommended a knee immobilizer and outpatient follow-up.  Pt known to RD from admission last week. Last assessment <1 week ago.  Pt resting in bedside chair at the time of visit. Reports that he has been napping this morning and feels pretty good. States that appetite is fair. Still agreeable to receiving ensure supplements to assist in meeting nutrition needs this admission.   Nutritionally Relevant Medications: Scheduled Meds:  docusate sodium  100 mg Oral BID   dolutegravir  50 mg Oral QAC breakfast   rilpivirine  25 mg Oral Q breakfast   folic acid  1 mg Oral Daily   multivitamin with minerals  1 tablet Oral Daily   thiamine  100 mg Oral Daily   Continuous Infusions:  sodium chloride 100 mL/hr at 10/02/20 0405   methocarbamol (ROBAXIN) IV     PRN Meds: ondansetron, polyethylene glycol  Labs Reviewed: Na 132 Creatinine  1.33  NUTRITION - FOCUSED PHYSICAL EXAM: Flowsheet Row Most Recent Value  Orbital Region Mild depletion  Upper Arm Region Severe depletion  Thoracic and Lumbar Region Severe depletion  Buccal Region Mild depletion  Temple Region No depletion  Clavicle Bone Region No depletion  Clavicle and Acromion Bone Region No depletion  Scapular Bone Region No depletion  Dorsal Hand No depletion  Patellar Region Mild depletion  Anterior Thigh Region Mild depletion  Posterior Calf Region Mild depletion  Edema (RD Assessment) None  Hair Reviewed  Eyes Reviewed  Mouth Reviewed  Skin Reviewed  Nails Reviewed   Diet Order:   Diet Order             Diet Heart Room service appropriate? Yes; Fluid consistency: Thin  Diet effective now                   EDUCATION NEEDS:  No education needs have been identified at this time  Skin:  Skin Assessment: Reviewed RN Assessment  Last BM:  7/30  Height:  Ht Readings from Last 1 Encounters:  10/01/20 '5\' 10"'$  (1.778 m)    Weight:  Wt Readings from Last 1 Encounters:  10/01/20 83 kg    Ideal Body Weight:  75.5 kg  BMI:  Body mass index is 26.26 kg/m.  Estimated Nutritional Needs:  Kcal:  1800-2000 kcal/d Protein:  90-100 g/d Fluid:  >2 L/d   Ranell Patrick, RD, LDN Clinical Dietitian Pager on [place]

## 2020-10-03 DIAGNOSIS — W19XXXA Unspecified fall, initial encounter: Secondary | ICD-10-CM | POA: Diagnosis not present

## 2020-10-03 DIAGNOSIS — S72401A Unspecified fracture of lower end of right femur, initial encounter for closed fracture: Secondary | ICD-10-CM | POA: Diagnosis not present

## 2020-10-03 DIAGNOSIS — F1010 Alcohol abuse, uncomplicated: Secondary | ICD-10-CM | POA: Diagnosis not present

## 2020-10-03 LAB — CULTURE, BLOOD (ROUTINE X 2)
Culture: NO GROWTH
Culture: NO GROWTH
Special Requests: ADEQUATE
Special Requests: ADEQUATE

## 2020-10-03 NOTE — NC FL2 (Signed)
[place] LEVEL OF CARE SCREENING TOOL     IDENTIFICATION  Patient Name: Anthony Olson. Birthdate: [birth year] Sex: male Admission Date (Current Location): 10/01/2020  [facility] and [place] Number:  Engineering geologist and Address:  [facility], [address], [place], [place] [address]      Provider Number: [id number]  Attending Physician Name and Address:  Lorella Nimrod, MD  Relative Name and Phone Number:  Blythe Stanford SOn [phone]    Current Level of Care: Hospital Recommended Level of Care: [place] Prior Approval Number:    Date Approved/Denied:   PASRR Number: [id number]  Discharge Plan:      Current Diagnoses: Patient Active Problem List   Diagnosis Date Noted   Alcohol abuse    Fall    Femur fracture, right ([place]) 10/01/2020   Malnutrition of moderate degree 09/28/2020   Cellulitis of scrotum 09/26/2020   Nicotine dependence 09/26/2020   Encounter for screening colonoscopy    Alcohol use disorder, severe, dependence ([place]) 12/22/2019   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/22/2019   History of posttraumatic stress disorder (PTSD) 12/22/2019   Cocaine use disorder, moderate, in sustained remission ([place]) 12/22/2019   Leg pain 11/10/2019   Unilateral recurrent inguinal hernia without obstruction or gangrene 10/02/2017   Syncope 09/18/2017   Monoclonal gammopathy of unknown significance (MGUS) 07/19/2016   Thrombocytopenia ([place]) 07/19/2016   Macrocytosis 03/20/2015   Kidney disease 03/20/2015   Peripheral vascular disease ([place]) 03/20/2015   Hypertensive disorder 03/20/2015   Peripheral neuritis 03/20/2015   Seborrheic dermatitis [id number]   Urinary complication, postoperative 03/20/2015   Right inguinal hernia 02/01/2015   Right hip pain 02/01/2015   H/O neoplasm 10/03/2014   History of nonmelanoma skin cancer 10/03/2014   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   Spondylosis  of lumbar region without myelopathy or radiculopathy 04/25/2014   H/O total knee replacement 04/18/2014   LBP (low back pain) 03/11/2014   Acute confusion 02/17/2014   Acute renal failure ([place]) 02/17/2014   Arterial blood pressure decreased 02/17/2014   Elevated WBC count 02/17/2014   Hypotension 02/17/2014   Anxiety and depression 02/15/2014   Personal history of other infectious and parasitic diseases 02/15/2014   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   Arthritis of knee, degenerative 12/13/2013   Derangement of posterior horn of medial meniscus due to old injury, left 12/13/2013   Primary osteoarthritis of left knee 12/13/2013   Subacromial impingement of right shoulder 08/19/2013   Acid reflux 08/02/2013   Human immunodeficiency virus (HIV) infection ([place]) 08/02/2013   Human immunodeficiency virus (HIV) disease ([place]) 08/02/2013   Biceps tendonitis on right 07/30/2013   Rotator cuff syndrome of left shoulder 07/30/2013    Orientation RESPIRATION BLADDER Height & Weight     Self, Time, Situation, Place  Normal Continent Weight: 83 kg Height:  '5\' 10"'$  (177.8 cm)  BEHAVIORAL SYMPTOMS/MOOD NEUROLOGICAL BOWEL NUTRITION STATUS      Continent Diet (regular)  AMBULATORY STATUS COMMUNICATION OF NEEDS Skin   Extensive Assist Verbally Surgical wounds                       Personal Care Assistance Level of Assistance  Bathing, Dressing Bathing Assistance: Limited assistance   Dressing Assistance: Limited assistance     Functional Limitations Info             SPECIAL CARE FACTORS FREQUENCY  Contractures Contractures Info: Not present    Additional Factors Info  Code Status, Allergies Code Status Info: DNR Allergies Info: Nicotine           Current Medications (10/03/2020):  This is the current hospital active medication list Current Facility-Administered Medications  Medication Dose Route Frequency Provider Last Rate Last Admin    acetaminophen (TYLENOL) tablet 650 mg  650 mg Oral Q6H PRN [place] Eric J, DO       albuterol (PROVENTIL) (2.5 MG/3ML) 0.083% nebulizer solution 3 mL  3 mL Inhalation Q6H PRN [place] J, DO       atorvastatin (LIPITOR) tablet 40 mg  40 mg Oral QHS Lorella Nimrod, MD   40 mg at 10/02/20 2102   B-complex with vitamin C tablet 1 tablet  1 tablet Oral Daily Lorella Nimrod, MD   1 tablet at 10/03/20 1054   busPIRone (BUSPAR) tablet 10 mg  10 mg Oral BID [place] Eric J, DO   10 mg at 10/03/20 1055   docusate sodium (COLACE) capsule 100 mg  100 mg Oral BID [place] Eric J, DO   100 mg at 10/03/20 1053   dolutegravir (TIVICAY) tablet 50 mg  50 mg Oral QAC breakfast [place] Eric J, DO   50 mg at 10/03/20 [id number]   And   rilpivirine (EDURANT) tablet 25 mg  25 mg Oral Q breakfast [place] Eric J, DO   25 mg at 10/03/20 0756   enoxaparin (LOVENOX) injection 40 mg  40 mg Subcutaneous Q24H [place] Eric J, DO   40 mg at 10/02/20 2026   escitalopram (LEXAPRO) tablet 30 mg  30 mg Oral Daily [place] Eric J, DO   30 mg at 10/03/20 1055   feeding supplement (ENSURE ENLIVE / ENSURE PLUS) liquid 237 mL  237 mL Oral BID BM Lorella Nimrod, MD   237 mL at [id number]   folic acid (FOLVITE) tablet 1 mg  1 mg Oral Daily [place] Donnamarie Poag, DO   1 mg at 10/03/20 1054   hydrALAZINE (APRESOLINE) tablet 25 mg  25 mg Oral Q8H PRN [place] Donnamarie Poag, DO       HYDROcodone-acetaminophen (NORCO/VICODIN) 5-325 MG per tablet 1-2 tablet  1-2 tablet Oral Q6H PRN [place] Eric J, DO   2 tablet at 10/03/20 0156   lisinopril (ZESTRIL) tablet 2.5 mg  2.5 mg Oral Daily [place] Donnamarie Poag, DO   2.5 mg at 10/03/20 1053   LORazepam (ATIVAN) injection 0-4 mg  0-4 mg Intravenous Q6H Vanessa [place], MD   2 mg at 10/02/20 2029   Or   LORazepam (ATIVAN) tablet 0-4 mg  0-4 mg Oral Q6H Vanessa [place], MD   2 mg at 10/02/20 1519   LORazepam (ATIVAN) injection 0-4 mg  0-4 mg Intravenous Q12H Vanessa [place], MD       Or   LORazepam (ATIVAN) tablet 0-4 mg   0-4 mg Oral Q12H Vanessa [place], MD       LORazepam (ATIVAN) tablet 1-4 mg  1-4 mg Oral Q1H PRN [place] Eric J, DO   2 mg at 10/02/20 1028   Or   LORazepam (ATIVAN) injection 1-4 mg  1-4 mg Intravenous Q1H PRN [place] Eric J, DO       methocarbamol (ROBAXIN) tablet 500 mg  500 mg Oral Q6H PRN [place] Eric J, DO   500 mg at 10/02/20 1519   Or   methocarbamol (ROBAXIN) 500 mg in dextrose 5 % 50 mL IVPB  500  mg Intravenous Q6H PRN [place] Donnamarie Poag, DO       morphine 2 MG/ML injection 0.5 mg  0.5 mg Intravenous Q2H PRN [place] Eric J, DO       multivitamin with minerals tablet 1 tablet  1 tablet Oral Daily [place] Donnamarie Poag, DO   1 tablet at 10/03/20 1054   ondansetron (ZOFRAN) injection 4 mg  4 mg Intravenous Q8H PRN [place] Eric J, DO       polyethylene glycol (MIRALAX / GLYCOLAX) packet 17 g  17 g Oral Daily PRN [place] Eric J, DO   17 g at 10/02/20 1002   thiamine tablet 100 mg  100 mg Oral Daily [place] Donnamarie Poag, DO   100 mg at 10/03/20 1053   Or   thiamine (B-1) injection 100 mg  100 mg Intravenous Daily [place] Eric J, DO         Discharge Medications: Please see discharge summary for a list of discharge medications.  Relevant Imaging Results:  Relevant Lab Results:   Additional Information SS3 [id number] had 2 vaccines and 2 boosters  Wynton Hufstetler Jen Mow, RN

## 2020-10-03 NOTE — Progress Notes (Signed)
Occupational Therapy Treatment Patient Details Name: Anthony Olson. MRN: [id number] DOB: 1945/09/06 Today's Date: 10/03/2020    History of present illness Anthony Olson. is a 47yoM who [facility] after sustaining a fall, unknown time down, awoke to Rt knee swelling, pain, inability to bear weight. PMH: HIV, HTN, GAD/depression, ETOH, nicotine dependence, CKD3a, bilat TKA, recent scrotal cellulitis. Imaging revealing of Rt nondisplaced femur fracture extending into the lateral condyle. Orthopedics recommending conservative management with knee immobilizer, NWB. CK levels inititally elevated.   OT comments  Pt seen for OT tx this date. Pt eager to sit up in the recliner. Supervision for Supine>sit EOB. MIN A for STS and SPT + RW and VC for NWBing to RLE. Pt tolerated well, cues for safety. Once seated, set up with urinal for seated toileting. Pt educated in phone to dial out, requiring assist from OT to complete call to friend to help him in paying bills. RN/NT notified pt up in chair, chair alarm on, call bell in lap. Continue to recommend SNF at discharge for short term rehab in order to maximize safety/indep and return to PLOF.    Follow Up Recommendations  SNF    Equipment Recommendations  3 in 1 bedside commode    Recommendations for Other Services      Precautions / Restrictions Precautions Precautions: Fall Required Braces or Orthoses: Knee Immobilizer - Right Knee Immobilizer - Right: On at all times;On except when in CPM Restrictions Weight Bearing Restrictions: Yes RLE Weight Bearing: Non weight bearing       Mobility Bed Mobility Overal bed mobility: Needs Assistance Bed Mobility: Supine to Sit     Supine to sit: Supervision;HOB elevated          Transfers Overall transfer level: Needs assistance Equipment used: Rolling walker (2 wheeled) Transfers: Sit to/from [facility] Sit to Stand: Min assist Stand pivot transfers: Min  assist       General transfer comment: VC for NWBing RLE, from EOB to recliner    Balance Overall balance assessment: Needs assistance;History of Falls Sitting-balance support: No upper extremity supported;Feet supported Sitting balance-Leahy Scale: Good     Standing balance support: Bilateral upper extremity supported;During functional activity Standing balance-Leahy Scale: Poor Standing balance comment: Requires CGA to min A with standing for balance, RLE NWB                           ADL either performed or assessed with clinical judgement   ADL Overall ADL's : Needs assistance/impaired                         Toilet Transfer: Set up;Supervision/safety Toilet Transfer Details (indicate cue type and reason): seated EOB with set up and supv for safety, used urinal                 Vision       Perception     Praxis      Cognition Arousal/Alertness: Awake/alert Behavior During Therapy: WFL for tasks assessed/performed Overall Cognitive Status: Within Functional Limits for tasks assessed                                 General Comments: difficulty using phone to dial out, assist required        Exercises     Shoulder Instructions  General Comments      Pertinent Vitals/ Pain       Pain Assessment: 0-10 Pain Score: 7  Pain Location: right thigh/leg pain Pain Descriptors / Indicators: Tightness;Spasm Pain Intervention(s): Limited activity within patient's tolerance;Monitored during session;Repositioned  Home Living                                          Prior Functioning/Environment              Frequency  Min 1X/week        Progress Toward Goals  OT Goals(current goals can now be found in the care plan section)  Progress towards OT goals: Progressing toward goals  Acute Rehab OT Goals Patient Stated Goal: regain strength and independence OT Goal Formulation: With  patient Time For Goal Achievement: 10/16/20 Potential to Achieve Goals: Good  Plan Discharge plan remains appropriate;Frequency remains appropriate    Co-evaluation                 AM-PAC OT "6 Clicks" Daily Activity     Outcome Measure   Help from another person eating meals?: None Help from another person taking care of personal grooming?: None Help from another person toileting, which includes using toliet, bedpan, or urinal?: A Lot Help from another person bathing (including washing, rinsing, drying)?: A Little Help from another person to put on and taking off regular upper body clothing?: None Help from another person to put on and taking off regular lower body clothing?: A Little 6 Click Score: 20    End of Session Equipment Utilized During Treatment: Gait belt;Rolling walker  OT Visit Diagnosis: Other abnormalities of gait and mobility (R26.89);Repeated falls (R29.6);Pain Pain - Right/Left: Right Pain - part of body: Hip;Knee;Leg   Activity Tolerance Patient tolerated treatment well   Patient Left in chair;with call bell/phone within reach;with chair alarm set   Nurse Communication Other (comment) (in recliner, chair alarm on)        Time: 1410-1419 OT Time Calculation (min): 9 min  Charges: OT General Charges $OT Visit: 1 Visit OT Treatments $Self Care/Home Management : 8-22 mins  Hanley Hays, MPH, MS, OTR/L ascom [phone] 10/03/20, 3:30 PM

## 2020-10-03 NOTE — Plan of Care (Signed)

## 2020-10-03 NOTE — Progress Notes (Signed)
Physical Therapy Treatment Patient Details Name: Anthony Olson. MRN: [id number] DOB: 1945-10-06 Today's Date: 10/03/2020    History of Present Illness Anthony Olson. is a 67yoM who [facility] after sustaining a fall, unknown time down, awoke to Rt knee swelling, pain, inability to bear weight. PMH: HIV, HTN, GAD/depression, ETOH, nicotine dependence, CKD3a, bilat TKA, recent scrotal cellulitis. Imaging revealing of Rt nondisplaced femur fracture extending into the lateral condyle. Orthopedics recommending conservative management with knee immobilizer, NWB. CK levels inititally elevated.    PT Comments    Patient motivated and participated well within session. He was able to tolerate LE exercise fair, with increased pain/spasms reported in RLE with movement. PT provided assistance with AAROM to RLE for better tolerance. Patient does require min VCs for proper positioning and exercise technique. He was able to progress transfers and take a few steps, maintaining RLE NWB well. Patient does require min VCs for hand placement and to advance RLE forward to maintain RLE NWB well during transfers. He reports increased fatigue at end of session. Would benefit from additional skilled PT Intervention to improve strength and mobility. Current plan of SNF rehab upon discharge is appropriate.   Follow Up Recommendations  SNF;Supervision for mobility/OOB     Equipment Recommendations  None recommended by PT (has a RW at home)    Recommendations for Other Services       Precautions / Restrictions Precautions Precautions: Fall Required Braces or Orthoses: Knee Immobilizer - Right Knee Immobilizer - Right: On at all times;On except when in CPM Restrictions Weight Bearing Restrictions: Yes RLE Weight Bearing: Non weight bearing    Mobility  Bed Mobility Overal bed mobility: Needs Assistance Bed Mobility: Supine to Sit     Supine to sit: Supervision;HOB elevated           Transfers Overall transfer level: Needs assistance Equipment used: Rolling walker (2 wheeled) Transfers: Sit to/from [facility] Sit to Stand: Min assist Stand pivot transfers: Min assist       General transfer comment: Patient required min A from bed with min VCs for hand placement for safety; He was able to take a few steps during pivot transfer while maintaining RLE NWB well; Required cues to advance RLE forward to maintain NWB prior to sitting with cues to reach back for chair for safety x3 reps;  Ambulation/Gait Ambulation/Gait assistance: Min guard Gait Distance (Feet): 5 Feet Assistive device: Rolling walker (2 wheeled)   Gait velocity: decreased   General Gait Details: Pt able to take 5 steps forward/backward, hop to gait pattern, maintaining NWB on RLE with Knee Immobilizer on RLE; He required CGA for safety and min VCs for walker positioning for safety; Does report increased fatigue but able to keep RLE off floor well with good positioning.   Stairs             Wheelchair Mobility    Modified Rankin (Stroke Patients Only)       Balance Overall balance assessment: Needs assistance;History of Falls Sitting-balance support: No upper extremity supported Sitting balance-Leahy Scale: Good     Standing balance support: Bilateral upper extremity supported;During functional activity Standing balance-Leahy Scale: Poor Standing balance comment: Requires CGA to min A with standing for balance, RLE NWB                            Cognition Arousal/Alertness: Awake/alert Behavior During Therapy: WFL for tasks assessed/performed Overall  Cognitive Status: Within Functional Limits for tasks assessed                                        Exercises Other Exercises Other Exercises: Instructed patient in LE ROM/strengthening exercise: RLE: ankle pump x15 reps AROM, SLR hip flexion AAROM x10 reps with increased spasm and  discomfort reported; Hip abduction/adduction to midline, AAROM x10 reps to tolerated ROM; Knee immobilizer worn at all times Other Exercises: Pt instructed in LLE strengthening/ROM exercise: SLR hip flexion x15 reps with visual cues to increase ROM to tolerance for better strengthening, LLE: suspended heel slides x10 reps, hip abduction/adduction to midline x15 reps; all AROM; Patient required min VCs to increase ROM to tolerance for better strengthening. He denies any pain with LLE movement; Other Exercises: Pt tolerated exercise fair. He does report increased pain/spasms with RLE movement.    General Comments        Pertinent Vitals/Pain Pain Assessment: 0-10 Pain Score: 6  Pain Location: right thigh/leg pain Pain Descriptors / Indicators: Tightness;Spasm Pain Intervention(s): Limited activity within patient's tolerance;Monitored during session;Premedicated before session;Repositioned    Home Living                      Prior Function            PT Goals (current goals can now be found in the care plan section) Acute Rehab PT Goals Patient Stated Goal: regain strength and independence PT Goal Formulation: With patient Time For Goal Achievement: 10/16/20 Potential to Achieve Goals: Good Progress towards PT goals: Progressing toward goals    Frequency    7X/week      PT Plan      Co-evaluation              AM-PAC PT "6 Clicks" Mobility   Outcome Measure  Help needed turning from your back to your side while in a flat bed without using bedrails?: A Little Help needed moving from lying on your back to sitting on the side of a flat bed without using bedrails?: A Little Help needed moving to and from a bed to a chair (including a wheelchair)?: A Little Help needed standing up from a chair using your arms (e.g., wheelchair or bedside chair)?: A Little Help needed to walk in hospital room?: A Lot Help needed climbing 3-5 steps with a railing? : A Lot 6 Click  Score: 16    End of Session Equipment Utilized During Treatment: Gait belt Activity Tolerance: Patient tolerated treatment well;Patient limited by pain Patient left: with call bell/phone within reach;in chair;with chair alarm set Nurse Communication: Mobility status PT Visit Diagnosis: Other abnormalities of gait and mobility (R26.89);Repeated falls (R29.6);Muscle weakness (generalized) (M62.81);History of falling (Z91.81);Difficulty in walking, not elsewhere classified (R26.2)     Time: 1010-1038 PT Time Calculation (min) (ACUTE ONLY): 28 min  Charges:  $Therapeutic Exercise: 8-22 mins $Therapeutic Activity: 8-22 mins                        Marlynn Hinckley PT, DPT 10/03/2020, 10:56 AM

## 2020-10-03 NOTE — Progress Notes (Signed)
PROGRESS NOTE    Tennis Must.  [id number] DOB: June 03, 1945 DOA: 10/01/2020 PCP: Theotis Burrow, MD   Brief Narrative: Taken from H&P.  Anthony Olson. is a 75 y.o. male with medical history significant of HIV, essential hypertension, anxiety/depression, EtOH/nicotine dependence, CKD stage IIIa, recent diagnosis cellulitis of scrotum who presented to [facility] ED on 7/31 following fall at home.  Patient reports was at a birthday party yesterday where he drank some tequila and when he returned home he did not recall etiology of his fall to the ground.  Unknown amount of time on the ground.  Lives alone.  He woke up with pain to his right knee with associated swelling.   Imaging concerning for nondisplaced femoral condyle fracture. Labs pertinent for mildly elevated CK which has been normalized.  EtOH level elevated at 62. Orthopedic was consulted and they are recommending conservative management without any surgical intervention with pain management, immobilization and nonweightbearing right lower extremity. PT is recommending SNF-TOC is working on it  Subjective: Patient was complaining of pain in right hip and thigh.  Pain increased with any movements.  Denies any tingling or numbness.  Sitting comfortably in chair when seen today.  Assessment & Plan:   Principal Problem:   Femur fracture, right (HCC) Active Problems:   H/O total knee replacement   Human immunodeficiency virus (HIV) infection (HCC)   Alcohol use disorder, severe, dependence ([place])   Alcohol abuse   Fall  Right femur fracture, nondisplaced Patient presenting to ED after fall at home for unclear etiology, suspect due to acute alcohol intoxication.  Orthopedic is recommending nonsurgical, conservative management and he will remain with knee immobilizer and nonweightbearing on right lower extremity and will follow-up with them as an outpatient.  Vitamin D level within normal limit PT is recommending SNF -TOC to  look for placement -Continue with pain management  Mild rhabdomyolysis.  Most likely secondary to lying on floor for unknown time.  CK has been normalized.  And IV fluids were discontinued yesterday, home statin was restarted.  Acute EtOH intoxication/History of EtOH dependence Patient reports was at a birthday party, drinking tequila.  Typically drinks roughly a sixpack of beer daily.  Discussed need for complete cessation given his multiple recurrent falls.  Patient also amenable for EtOH rehab.  Denies any history of significant withdrawal symptoms or seizures. -Continue with CIWA protocol.  No need of Ativan so far. -Patient was counseled again-we will need persistent counseling.  Recent admission for scrotal cellulitis Recently discharged on 7/28 with scrotal cellulitis, received IV Zosyn while inpatient and transition to Augmentin to complete course on 10/03/2020. --Resume Augmentin to complete antibiotic course   Depression/anxiety --Lexapro 30 mg p.o. daily --BuSpar 10 mg p.o. twice daily   Essential hypertension --Continue lisinopril 2.5 mg p.o. daily --Hydralazine 25 mg p.o. every 8 hours prn SBP >180 or DBP >110   History of HIV --Resume home medications   HLD: Initially home statin was held due to elevated CK -Can restart home statin  Nicotine dependence: Continues to smoke 1 pack/day.  Counseled on need for cessation.  Declines nicotine patch.  Objective: Vitals:   10/03/20 0320 10/03/20 0432 10/03/20 0801 10/03/20 1207  BP: 130/86 (!) 141/85 (!) 141/81 108/72  Pulse: 65 62 71 65  Resp:  '17 18 20  '$ Temp: 98 F (36.7 C) 97.9 F (36.6 C) 98.9 F (37.2 C) 97.9 F (36.6 C)  TempSrc:      SpO2: 95% 97% 97%  100%  Weight:      Height:        Intake/Output Summary (Last 24 hours) at 10/03/2020 1455 Last data filed at 10/03/2020 0156 Gross per 24 hour  Intake --  Output 425 ml  Net -425 ml    Filed Weights   10/01/20 0846  Weight: 83 kg     Examination:  General.  Well-developed gentleman, in no acute distress. Pulmonary.  Lungs clear bilaterally, normal respiratory effort. CV.  Regular rate and rhythm, no JVD, rub or murmur. Abdomen.  Soft, nontender, nondistended, BS positive. CNS.  Alert and oriented x3.  No focal neurologic deficit. Extremities.  No edema, no cyanosis, pulses intact and symmetrical. Psychiatry.  Judgment and insight appears normal.   DVT prophylaxis: Lovenox Code Status: DNR Family Communication: Discussed with patient, called son with no response. Disposition Plan:  Status is: Inpatient  Remains inpatient appropriate because:Inpatient level of care appropriate due to severity of illness  Dispo: The patient is from: Home              Anticipated d/c is to: SNF              Patient currently is medically stable to d/c.   Difficult to place patient No              Level of care: Med-Surg  All the records are reviewed and case discussed with Care Management/Social Worker. Management plans discussed with the patient, nursing and they are in agreement.  Consultants:  Orthopedic surgery  Procedures:  Antimicrobials:   Data Reviewed: I have personally reviewed following labs and imaging studies  CBC: Recent Labs  Lab 10/01/20 0902 10/02/20 0417  WBC 10.4 6.6  NEUTROABS 7.7  --   HGB 13.9 12.9*  HCT 39.2 36.6*  MCV 101.0* 102.2*  PLT 123* 130*    Basic Metabolic Panel: Recent Labs  Lab 09/28/20 0417 10/01/20 0902 10/02/20 0417  NA  --  137 132*  K  --  3.7 3.7  CL  --  103 103  CO2  --  20* 23  GLUCOSE  --  81 92  BUN  --  14 17  CREATININE 1.37* 1.50* 1.33*  CALCIUM  --  8.5* 7.8*  MG  --   --  2.1  PHOS  --   --  3.1    GFR: Estimated Creatinine Clearance: 51.8 mL/min (A) (by C-G formula based on SCr of 1.33 mg/dL (H)). Liver Function Tests: Recent Labs  Lab 10/01/20 0902 10/02/20 0417  AST 37 31  ALT 18 15  ALKPHOS 84 82  BILITOT 1.2 1.8*  PROT 6.5 5.7*   ALBUMIN 3.5 3.1*    No results for input(s): LIPASE, AMYLASE in the last 168 hours. No results for input(s): AMMONIA in the last 168 hours. Coagulation Profile: No results for input(s): INR, PROTIME in the last 168 hours. Cardiac Enzymes: Recent Labs  Lab 10/01/20 0902 10/02/20 0417  CKTOTAL 784* 322    BNP (last 3 results) No results for input(s): PROBNP in the last 8760 hours. HbA1C: No results for input(s): HGBA1C in the last 72 hours. CBG: No results for input(s): GLUCAP in the last 168 hours. Lipid Profile: No results for input(s): CHOL, HDL, LDLCALC, TRIG, CHOLHDL, LDLDIRECT in the last 72 hours. Thyroid Function Tests: No results for input(s): TSH, T4TOTAL, FREET4, T3FREE, THYROIDAB in the last 72 hours. Anemia Panel: No results for input(s): VITAMINB12, FOLATE, FERRITIN, TIBC, IRON, RETICCTPCT in the last 72  hours. Sepsis Labs: No results for input(s): PROCALCITON, LATICACIDVEN in the last 168 hours.   Recent Results (from the past 240 hour(s))  Culture, blood (Routine X 2) w Reflex to ID Panel     Status: None   Collection Time: 09/26/20 11:35 AM   Specimen: BLOOD  Result Value Ref Range Status   Specimen Description BLOOD  LEFT FOREARM  Final   Special Requests   Final    BOTTLES DRAWN AEROBIC AND ANAEROBIC Blood Culture adequate volume   Culture   Final    NO GROWTH 7 DAYS Performed at [facility], [address] [place], [place] [address]    Report Status 10/03/2020 FINAL  Final  Culture, blood (Routine X 2) w Reflex to ID Panel     Status: None   Collection Time: 09/26/20 11:45 AM   Specimen: BLOOD  Result Value Ref Range Status   Specimen Description BLOOD  LEFT AC  Final   Special Requests   Final    BOTTLES DRAWN AEROBIC AND ANAEROBIC Blood Culture adequate volume   Culture   Final    NO GROWTH 7 DAYS Performed at [facility], [address] [place], [place] [address]    Report Status 10/03/2020 FINAL  Final  Resp Panel  by RT-PCR (Flu A&B, Covid) Nasopharyngeal Swab     Status: None   Collection Time: 09/26/20  2:16 PM   Specimen: Nasopharyngeal Swab; Nasopharyngeal(NP) swabs in vial transport medium  Result Value Ref Range Status   SARS Coronavirus 2 by RT PCR NEGATIVE NEGATIVE Final    Comment: (NOTE) SARS-CoV-2 target nucleic acids are NOT DETECTED.  The SARS-CoV-2 RNA is generally detectable in upper respiratory specimens during the acute phase of infection. The lowest concentration of SARS-CoV-2 viral copies this assay can detect is 138 copies/mL. A negative result does not preclude SARS-Cov-2 infection and should not be used as the sole basis for treatment or other patient management decisions. A negative result may occur with  improper specimen collection/handling, submission of specimen other than nasopharyngeal swab, presence of viral mutation(s) within the areas targeted by this assay, and inadequate number of viral copies(<138 copies/mL). A negative result must be combined with clinical observations, patient history, and epidemiological information. The expected result is Negative.  Fact Sheet for Patients:  EntrepreneurPulse.com.au  Fact Sheet for Healthcare Providers:  IncredibleEmployment.be  This test is no t yet approved or cleared by the [place] FDA and  has been authorized for detection and/or diagnosis of SARS-CoV-2 by FDA under an Emergency Use Authorization (EUA). This EUA will remain  in effect (meaning this test can be used) for the duration of the COVID-19 declaration under Section 564(b)(1) of the Act, 21 U.S.C.section 360bbb-3(b)(1), unless the authorization is terminated  or revoked sooner.       Influenza A by PCR NEGATIVE NEGATIVE Final   Influenza B by PCR NEGATIVE NEGATIVE Final    Comment: (NOTE) The Xpert Xpress SARS-CoV-2/FLU/RSV plus assay is intended as an aid in the diagnosis of influenza from Nasopharyngeal swab  specimens and should not be used as a sole basis for treatment. Nasal washings and aspirates are unacceptable for Xpert Xpress SARS-CoV-2/FLU/RSV testing.  Fact Sheet for Patients: EntrepreneurPulse.com.au  Fact Sheet for Healthcare Providers: IncredibleEmployment.be  This test is not yet approved or cleared by the [place] FDA and has been authorized for detection and/or diagnosis of SARS-CoV-2 by FDA under an Emergency Use Authorization (EUA). This EUA will remain in effect (meaning  this test can be used) for the duration of the COVID-19 declaration under Section 564(b)(1) of the Act, 21 U.S.C. section 360bbb-3(b)(1), unless the authorization is terminated or revoked.  Performed at [facility], [place], [place] [address]   Resp Panel by RT-PCR (Flu A&B, Covid) Nasopharyngeal Swab     Status: None   Collection Time: 10/01/20 12:25 PM   Specimen: Nasopharyngeal Swab; Nasopharyngeal(NP) swabs in vial transport medium  Result Value Ref Range Status   SARS Coronavirus 2 by RT PCR NEGATIVE NEGATIVE Final    Comment: (NOTE) SARS-CoV-2 target nucleic acids are NOT DETECTED.  The SARS-CoV-2 RNA is generally detectable in upper respiratory specimens during the acute phase of infection. The lowest concentration of SARS-CoV-2 viral copies this assay can detect is 138 copies/mL. A negative result does not preclude SARS-Cov-2 infection and should not be used as the sole basis for treatment or other patient management decisions. A negative result may occur with  improper specimen collection/handling, submission of specimen other than nasopharyngeal swab, presence of viral mutation(s) within the areas targeted by this assay, and inadequate number of viral copies(<138 copies/mL). A negative result must be combined with clinical observations, patient history, and epidemiological information. The expected result is  Negative.  Fact Sheet for Patients:  EntrepreneurPulse.com.au  Fact Sheet for Healthcare Providers:  IncredibleEmployment.be  This test is no t yet approved or cleared by the [place] FDA and  has been authorized for detection and/or diagnosis of SARS-CoV-2 by FDA under an Emergency Use Authorization (EUA). This EUA will remain  in effect (meaning this test can be used) for the duration of the COVID-19 declaration under Section 564(b)(1) of the Act, 21 U.S.C.section 360bbb-3(b)(1), unless the authorization is terminated  or revoked sooner.       Influenza A by PCR NEGATIVE NEGATIVE Final   Influenza B by PCR NEGATIVE NEGATIVE Final    Comment: (NOTE) The Xpert Xpress SARS-CoV-2/FLU/RSV plus assay is intended as an aid in the diagnosis of influenza from Nasopharyngeal swab specimens and should not be used as a sole basis for treatment. Nasal washings and aspirates are unacceptable for Xpert Xpress SARS-CoV-2/FLU/RSV testing.  Fact Sheet for Patients: EntrepreneurPulse.com.au  Fact Sheet for Healthcare Providers: IncredibleEmployment.be  This test is not yet approved or cleared by the [place] FDA and has been authorized for detection and/or diagnosis of SARS-CoV-2 by FDA under an Emergency Use Authorization (EUA). This EUA will remain in effect (meaning this test can be used) for the duration of the COVID-19 declaration under Section 564(b)(1) of the Act, 21 U.S.C. section 360bbb-3(b)(1), unless the authorization is terminated or revoked.  Performed at [facility], [address] [place], [place] [address]       Radiology Studies: No results found.  Scheduled Meds:  atorvastatin  40 mg Oral QHS   B-complex with vitamin C  1 tablet Oral Daily   busPIRone  10 mg Oral BID   docusate sodium  100 mg Oral BID   dolutegravir  50 mg Oral QAC breakfast   And   rilpivirine  25 mg  Oral Q breakfast   enoxaparin (LOVENOX) injection  40 mg Subcutaneous Q24H   escitalopram  30 mg Oral Daily   feeding supplement  237 mL Oral BID BM   folic acid  1 mg Oral Daily   lisinopril  2.5 mg Oral Daily   LORazepam  0-4 mg Intravenous Q6H   Or   LORazepam  0-4 mg Oral  Q6H   LORazepam  0-4 mg Intravenous Q12H   Or   LORazepam  0-4 mg Oral Q12H   multivitamin with minerals  1 tablet Oral Daily   thiamine  100 mg Oral Daily   Or   thiamine  100 mg Intravenous Daily   Continuous Infusions:  methocarbamol (ROBAXIN) IV       LOS: 2 days   Time spent: 30 minutes. More than 50% of the time was spent in counseling/coordination of care  Lorella Nimrod, MD [facility]  If 7PM-7AM, please contact night-coverage [email or web address]  10/03/2020, 2:55 PM   This record has been created using Systems analyst. Errors have been sought and corrected,but may not always be located. Such creation errors do not reflect on the standard of care.

## 2020-10-03 NOTE — TOC Progression Note (Signed)
Transition of Care (TOC) - Progression Note    Patient Details  Name: Anthony Olson. MRN: [id number] Date of Birth: 14-Oct-1945  Transition [facility]) CM/SW Contact  Su Hilt, RN Phone Number: 10/03/2020, 3:01 PM  Clinical Narrative:     Reached out to the facilities requesting that they review for a bed, awaiting bed offers, no offers at this time       Expected Discharge Plan and Services                                                 Social Determinants of Health (SDOH) Interventions    Readmission Risk Interventions No flowsheet data found.

## 2020-10-03 NOTE — Progress Notes (Signed)
PT Cancellation Note  Patient Details Name: Anthony Olson. MRN: [id number] DOB: Oct 29, 1945   Cancelled Treatment:    Reason Eval/Treat Not Completed: Other (comment)  Patient eating breakfast. Will re-attempt treatment when patient agreeable and ready.  Thank you for this referral.   Carrington Mullenax PT, DPT 10/03/2020, 9:20 AM

## 2020-10-03 NOTE — Progress Notes (Signed)
Vitals entered manually ° °

## 2020-10-04 DIAGNOSIS — S72401A Unspecified fracture of lower end of right femur, initial encounter for closed fracture: Secondary | ICD-10-CM | POA: Diagnosis not present

## 2020-10-04 MED ORDER — HYDROCODONE-ACETAMINOPHEN 5-325 MG PO TABS
1.0000 | ORAL_TABLET | Freq: Four times a day (QID) | ORAL | Status: DC | PRN
  Administered 2020-10-05 – 2020-10-06 (×4): 1 via ORAL
  Filled 2020-10-04 (×4): qty 1

## 2020-10-04 MED ORDER — HALOPERIDOL LACTATE 5 MG/ML IJ SOLN
1.0000 mg | Freq: Four times a day (QID) | INTRAMUSCULAR | Status: DC | PRN
  Administered 2020-10-04: 1 mg via INTRAVENOUS
  Filled 2020-10-04: qty 1

## 2020-10-04 MED ORDER — QUETIAPINE FUMARATE 25 MG PO TABS
25.0000 mg | ORAL_TABLET | Freq: Every evening | ORAL | Status: DC | PRN
  Administered 2020-10-04: 25 mg via ORAL
  Filled 2020-10-04: qty 1

## 2020-10-04 NOTE — Progress Notes (Signed)
Physical Therapy Treatment Patient Details Name: Anthony Olson. MRN: [id number] DOB: 1945/10/24 Today's Date: 10/04/2020    History of Present Illness Anthony Olson. is a 27yoM who comes to [facility] on 10/01/20 after sustaining a fall, unknown time down, awoke to Rt knee swelling, pain, inability to bear weight. PMH: HIV, HTN, GAD/depression, ETOH, nicotine dependence, CKD3a, bilat TKA, recent scrotal cellulitis. Imaging revealing of Rt nondisplaced femur fracture extending into the lateral condyle. Orthopedics recommending conservative management with knee immobilizer, NWB. CK levels inititally elevated.    PT Comments    Pt seen from hallway exiting BR with RW unattended. Author enters, remains patient of call bell protocol as well as NWB RLE status. Pt's orientation is altered today, alert to self, but not to current location, needs help to identify room as hospital, gives October as current month. Pt does recall that he is s/p Rt leg fracture. Pt does say to recognize author from previous days. Pt later refers to knee immobilizer as a pillow and attempts to put butter into his coffee, author orients him to creamer. NA made aware of pt's acute disorientation, NWB status. Pt left in recliner with meal presented, alarm armed.    Follow Up Recommendations  SNF;Supervision for mobility/OOB     Equipment Recommendations  None recommended by PT    Recommendations for Other Services       Precautions / Restrictions Precautions Precautions: Fall Required Braces or Orthoses: Knee Immobilizer - Right Knee Immobilizer - Right: On at all times;On except when in CPM Restrictions Weight Bearing Restrictions: Yes RLE Weight Bearing: Non weight bearing    Mobility  Bed Mobility               General bed mobility comments: standing in BR doorway upon entry    Transfers Overall transfer level: Needs assistance Equipment used: Rolling walker (2 wheeled) Transfers: Sit to/from Stand Sit  to Stand: Min guard;Supervision         General transfer comment: sequential one-step cues for safe return to sitting.  Ambulation/Gait Ambulation/Gait assistance: Min guard Gait Distance (Feet): 20 Feet Assistive device: Rolling walker (2 wheeled)       General Gait Details: needs frequent cues for weightbearing. successful 90% of time.   Stairs             Wheelchair Mobility    Modified Rankin (Stroke Patients Only)       Balance Overall balance assessment: History of Falls;Mild deficits observed, not formally tested                                          Cognition Arousal/Alertness: Awake/alert Behavior During Therapy: WFL for tasks assessed/performed Overall Cognitive Status: Impaired/Different from baseline Area of Impairment: Orientation                 Orientation Level: Person (required orientation to hospital twice, but thinks to be in a stranger's bedroom/living room.)             General Comments: difficulty using phone to dial out yesterday, required help from OT      Exercises      General Comments        Pertinent Vitals/Pain Pain Assessment: Faces Faces Pain Scale: Hurts worst Pain Location: hollers out intermittently when muscle spams hit, otherwise well managed Pain Descriptors / Indicators: Tightness;Spasm Pain Intervention(s): Limited activity within  patient's tolerance;Monitored during session    Home Living                      Prior Function            PT Goals (current goals can now be found in the care plan section) Acute Rehab PT Goals Patient Stated Goal: regain strength and independence PT Goal Formulation: With patient Time For Goal Achievement: 10/16/20 Potential to Achieve Goals: Good Progress towards PT goals: Progressing toward goals    Frequency    7X/week      PT Plan Current plan remains appropriate    Co-evaluation              AM-PAC PT "6  Clicks" Mobility   Outcome Measure  Help needed turning from your back to your side while in a flat bed without using bedrails?: A Little Help needed moving from lying on your back to sitting on the side of a flat bed without using bedrails?: A Little Help needed moving to and from a bed to a chair (including a wheelchair)?: A Little Help needed standing up from a chair using your arms (e.g., wheelchair or bedside chair)?: A Little Help needed to walk in hospital room?: A Lot Help needed climbing 3-5 steps with a railing? : A Lot 6 Click Score: 16    End of Session Equipment Utilized During Treatment: Gait belt Activity Tolerance: Patient tolerated treatment well;Treatment limited secondary to medical complications (Comment) (acute disorientation, confusion this morning) Patient left: with call bell/phone within reach;in chair;with chair alarm set Nurse Communication: Mobility status PT Visit Diagnosis: Other abnormalities of gait and mobility (R26.89);Repeated falls (R29.6);Muscle weakness (generalized) (M62.81);History of falling (Z91.81);Difficulty in walking, not elsewhere classified (R26.2)     Time: [id number] PT Time Calculation (min) (ACUTE ONLY): 15 min  Charges:  $Gait Training: 8-22 mins                    9:27 AM, 10/04/20 Etta Grandchild, PT, DPT Physical Therapist - [facility]  [phone] ([place])     Champ C 10/04/2020, 9:24 AM

## 2020-10-04 NOTE — TOC Progression Note (Addendum)
Transition of Care (TOC) - Progression Note    Patient Details  Name: Anthony Olson. MRN: [id number] Date of Birth: February 20, 1946  Transition of Care [facility]) CM/SW Contact  Su Hilt, RN Phone Number: 10/04/2020, 2:54 PM  Clinical Narrative:     Went in the review the bed offer of [facility] in [place], the patient is agreeable to the bed offer, I called the son Inkem and he is agreeable to the patient going to [facility] in [place] Attempted to start insurance auth for approval, Not able to find in the [place], Attempted to call [facility] and was on hold 1 Min. No one answered, I requested a call back       Expected Discharge Plan and Services                                                 Social Determinants of Health (SDOH) Interventions    Readmission Risk Interventions No flowsheet data found.

## 2020-10-04 NOTE — Progress Notes (Addendum)
Pt slept fairly okay during the night, but more confused. He made several attempt to get out of bed to void. He wants to go home to pay his bills which are due.  Vitals are stable.   10/04/20 0404  Vitals  Temp 97.9 F (36.6 C)  BP 134/83  MAP (mmHg) 99  BP Method Automatic  Pulse Rate 74  Pulse Rate Source Monitor  Resp 18  Level of Consciousness  Level of Consciousness Alert  MEWS COLOR  MEWS Score Color Green  Oxygen Therapy  SpO2 99 %  MEWS Score  MEWS Temp 0  MEWS Systolic 0  MEWS Pulse 0  MEWS RR 0  MEWS LOC 0  MEWS Score 0

## 2020-10-04 NOTE — Progress Notes (Addendum)
PROGRESS NOTE    Tennis Must.  [id number] DOB: Apr 26, 1945 DOA: 10/01/2020 PCP: Theotis Burrow, MD   Brief Narrative: Taken from H&P.  Anthony Olson. is a 75 y.o. male with medical history significant of HIV, essential hypertension, anxiety/depression, EtOH/nicotine dependence, CKD stage IIIa, recent diagnosis cellulitis of scrotum who presented to [facility] ED on 7/31 following fall at home.  Patient reports was at a birthday party yesterday where he drank some tequila and when he returned home he did not recall etiology of his fall to the ground.  Unknown amount of time on the ground.  Lives alone.  He woke up with pain to his right knee with associated swelling.   Imaging concerning for nondisplaced femoral condyle fracture. Labs pertinent for mildly elevated CK which has been normalized.  EtOH level elevated at 62. Orthopedic was consulted and they are recommending conservative management without any surgical intervention with pain management, immobilization and nonweightbearing right lower extremity. PT is recommending SNF-TOC is working on it  Subjective: Pt was noted to be more confused today.  Around mid-day, pt had a fall.  Pt reported falling backwards, and complained of pain in the middle of his back, left hip, and knee.     Assessment & Plan:   Principal Problem:   Femur fracture, right (HCC) Active Problems:   H/O total knee replacement   Human immunodeficiency virus (HIV) infection (HCC)   Alcohol use disorder, severe, dependence ([place])   Alcohol abuse   Fall  Right femur fracture, nondisplaced Patient presenting to ED after fall at home for unclear etiology, suspect due to acute alcohol intoxication.  Orthopedic is recommending nonsurgical, conservative management and he will remain with knee immobilizer and nonweightbearing on right lower extremity and will follow-up with them as an outpatient.  Vitamin D level within normal limit --d/c to SNF -Continue  with pain management --fall precautions.  Delirium --pt became more confused on day 3 of hospitalization.   --seroquel or haldol PRN for agitation  Mild rhabdomyolysis.  Most likely secondary to lying on floor for unknown time.  CK has been normalized.   --no need for further IVF  Acute EtOH intoxication/History of EtOH dependence Patient reports was at a birthday party, drinking tequila.  Typically drinks roughly a sixpack of beer daily.  Discussed need for complete cessation given his multiple recurrent falls.  Patient also amenable for EtOH rehab.  Denies any history of significant withdrawal symptoms or seizures. -Continue with CIWA protocol.  No need of Ativan so far. --cont thiamine   Recent admission for scrotal cellulitis Recently discharged on 7/28 with scrotal cellulitis, received IV Zosyn while inpatient and transition to Augmentin to complete course on 10/03/2020.   Depression/anxiety --Lexapro 30 mg p.o. daily and BuSpar 10 mg p.o. twice daily --cont home regimen   Essential hypertension --Continue lisinopril 2.5 mg p.o. daily   History of HIV --cont home anti-virals   HLD:  --cont home statin  Nicotine dependence: Continues to smoke 1 pack/day.  Counseled on need for cessation.  Declines nicotine patch.  Objective: Vitals:   10/04/20 0744 10/04/20 1107 10/04/20 1227 10/04/20 1559  BP: 115/85 115/77 (!) 141/89 129/82  Pulse: 71 70 87 72  Resp: '17 17 16 16  '$ Temp: 97.8 F (36.6 C) 97.6 F (36.4 C)  97.9 F (36.6 C)  TempSrc:      SpO2: 97% 100% 98% 100%  Weight:      Height:  Intake/Output Summary (Last 24 hours) at 10/04/2020 1639 Last data filed at 10/04/2020 1000 Gross per 24 hour  Intake 120 ml  Output 550 ml  Net -430 ml   Filed Weights   10/01/20 0846  Weight: 83 kg    Examination:  Constitutional: NAD, alert, oriented to self only HEENT: conjunctivae and lids normal, EOMI.  One small bump on the top back of his head. CV: No cyanosis.    RESP: normal respiratory effort, on RA Extremities: No effusions, edema in BLE SKIN: warm, dry Neuro: II - XII grossly intact.     DVT prophylaxis: Lovenox Code Status: DNR Family Communication:  Disposition Plan:  Status is: Inpatient  Remains inpatient appropriate because:Inpatient level of care appropriate due to severity of illness  Dispo: The patient is from: Home              Anticipated d/c is to: SNF              Patient currently is medically stable to d/c.   Difficult to place patient No              Level of care: Med-Surg  All the records are reviewed and case discussed with Care Management/Social Worker. Management plans discussed with the patient, nursing and they are in agreement.  Consultants:  Orthopedic surgery  Procedures:  Antimicrobials:   Data Reviewed: I have personally reviewed following labs and imaging studies  CBC: Recent Labs  Lab 10/01/20 0902 10/02/20 0417  WBC 10.4 6.6  NEUTROABS 7.7  --   HGB 13.9 12.9*  HCT 39.2 36.6*  MCV 101.0* 102.2*  PLT 123* [id number]   Basic Metabolic Panel: Recent Labs  Lab 09/28/20 0417 10/01/20 0902 10/02/20 0417  NA  --  137 132*  K  --  3.7 3.7  CL  --  103 103  CO2  --  20* 23  GLUCOSE  --  81 92  BUN  --  14 17  CREATININE 1.37* 1.50* 1.33*  CALCIUM  --  8.5* 7.8*  MG  --   --  2.1  PHOS  --   --  3.1   GFR: Estimated Creatinine Clearance: 51.8 mL/min (A) (by C-G formula based on SCr of 1.33 mg/dL (H)). Liver Function Tests: Recent Labs  Lab 10/01/20 0902 10/02/20 0417  AST 37 31  ALT 18 15  ALKPHOS 84 82  BILITOT 1.2 1.8*  PROT 6.5 5.7*  ALBUMIN 3.5 3.1*   No results for input(s): LIPASE, AMYLASE in the last 168 hours. No results for input(s): AMMONIA in the last 168 hours. Coagulation Profile: No results for input(s): INR, PROTIME in the last 168 hours. Cardiac Enzymes: Recent Labs  Lab 10/01/20 0902 10/02/20 0417  CKTOTAL 784* 322   BNP (last 3 results) No results for  input(s): PROBNP in the last 8760 hours. HbA1C: No results for input(s): HGBA1C in the last 72 hours. CBG: No results for input(s): GLUCAP in the last 168 hours. Lipid Profile: No results for input(s): CHOL, HDL, LDLCALC, TRIG, CHOLHDL, LDLDIRECT in the last 72 hours. Thyroid Function Tests: No results for input(s): TSH, T4TOTAL, FREET4, T3FREE, THYROIDAB in the last 72 hours. Anemia Panel: No results for input(s): VITAMINB12, FOLATE, FERRITIN, TIBC, IRON, RETICCTPCT in the last 72 hours. Sepsis Labs: No results for input(s): PROCALCITON, LATICACIDVEN in the last 168 hours.   Recent Results (from the past 240 hour(s))  Culture, blood (Routine X 2) w Reflex to ID Panel  Status: None   Collection Time: 09/26/20 11:35 AM   Specimen: BLOOD  Result Value Ref Range Status   Specimen Description BLOOD  LEFT FOREARM  Final   Special Requests   Final    BOTTLES DRAWN AEROBIC AND ANAEROBIC Blood Culture adequate volume   Culture   Final    NO GROWTH 7 DAYS Performed at [facility], [address] [place], [place] [address]    Report Status 10/03/2020 FINAL  Final  Culture, blood (Routine X 2) w Reflex to ID Panel     Status: None   Collection Time: 09/26/20 11:45 AM   Specimen: BLOOD  Result Value Ref Range Status   Specimen Description BLOOD  LEFT AC  Final   Special Requests   Final    BOTTLES DRAWN AEROBIC AND ANAEROBIC Blood Culture adequate volume   Culture   Final    NO GROWTH 7 DAYS Performed at [facility], [address] [place], [place] [address]    Report Status 10/03/2020 FINAL  Final  Resp Panel by RT-PCR (Flu A&B, Covid) Nasopharyngeal Swab     Status: None   Collection Time: 09/26/20  2:16 PM   Specimen: Nasopharyngeal Swab; Nasopharyngeal(NP) swabs in vial transport medium  Result Value Ref Range Status   SARS Coronavirus 2 by RT PCR NEGATIVE NEGATIVE Final    Comment: (NOTE) SARS-CoV-2 target nucleic acids are NOT DETECTED.  The  SARS-CoV-2 RNA is generally detectable in upper respiratory specimens during the acute phase of infection. The lowest concentration of SARS-CoV-2 viral copies this assay can detect is 138 copies/mL. A negative result does not preclude SARS-Cov-2 infection and should not be used as the sole basis for treatment or other patient management decisions. A negative result may occur with  improper specimen collection/handling, submission of specimen other than nasopharyngeal swab, presence of viral mutation(s) within the areas targeted by this assay, and inadequate number of viral copies(<138 copies/mL). A negative result must be combined with clinical observations, patient history, and epidemiological information. The expected result is Negative.  Fact Sheet for Patients:  EntrepreneurPulse.com.au  Fact Sheet for Healthcare Providers:  IncredibleEmployment.be  This test is no t yet approved or cleared by the [place] FDA and  has been authorized for detection and/or diagnosis of SARS-CoV-2 by FDA under an Emergency Use Authorization (EUA). This EUA will remain  in effect (meaning this test can be used) for the duration of the COVID-19 declaration under Section 564(b)(1) of the Act, 21 U.S.C.section 360bbb-3(b)(1), unless the authorization is terminated  or revoked sooner.       Influenza A by PCR NEGATIVE NEGATIVE Final   Influenza B by PCR NEGATIVE NEGATIVE Final    Comment: (NOTE) The Xpert Xpress SARS-CoV-2/FLU/RSV plus assay is intended as an aid in the diagnosis of influenza from Nasopharyngeal swab specimens and should not be used as a sole basis for treatment. Nasal washings and aspirates are unacceptable for Xpert Xpress SARS-CoV-2/FLU/RSV testing.  Fact Sheet for Patients: EntrepreneurPulse.com.au  Fact Sheet for Healthcare Providers: IncredibleEmployment.be  This test is not yet approved or  cleared by the [place] FDA and has been authorized for detection and/or diagnosis of SARS-CoV-2 by FDA under an Emergency Use Authorization (EUA). This EUA will remain in effect (meaning this test can be used) for the duration of the COVID-19 declaration under Section 564(b)(1) of the Act, 21 U.S.C. section 360bbb-3(b)(1), unless the authorization is terminated or revoked.  Performed at [facility], [address] [place], [place] [address]   Resp Panel by RT-PCR (Flu A&B, Covid) Nasopharyngeal Swab     Status: None   Collection Time: 10/01/20 12:25 PM   Specimen: Nasopharyngeal Swab; Nasopharyngeal(NP) swabs in vial transport medium  Result Value Ref Range Status   SARS Coronavirus 2 by RT PCR NEGATIVE NEGATIVE Final    Comment: (NOTE) SARS-CoV-2 target nucleic acids are NOT DETECTED.  The SARS-CoV-2 RNA is generally detectable in upper respiratory specimens during the acute phase of infection. The lowest concentration of SARS-CoV-2 viral copies this assay can detect is 138 copies/mL. A negative result does not preclude SARS-Cov-2 infection and should not be used as the sole basis for treatment or other patient management decisions. A negative result may occur with  improper specimen collection/handling, submission of specimen other than nasopharyngeal swab, presence of viral mutation(s) within the areas targeted by this assay, and inadequate number of viral copies(<138 copies/mL). A negative result must be combined with clinical observations, patient history, and epidemiological information. The expected result is Negative.  Fact Sheet for Patients:  EntrepreneurPulse.com.au  Fact Sheet for Healthcare Providers:  IncredibleEmployment.be  This test is no t yet approved or cleared by the [place] FDA and  has been authorized for detection and/or diagnosis of SARS-CoV-2 by FDA under an Emergency Use Authorization (EUA).  This EUA will remain  in effect (meaning this test can be used) for the duration of the COVID-19 declaration under Section 564(b)(1) of the Act, 21 U.S.C.section 360bbb-3(b)(1), unless the authorization is terminated  or revoked sooner.       Influenza A by PCR NEGATIVE NEGATIVE Final   Influenza B by PCR NEGATIVE NEGATIVE Final    Comment: (NOTE) The Xpert Xpress SARS-CoV-2/FLU/RSV plus assay is intended as an aid in the diagnosis of influenza from Nasopharyngeal swab specimens and should not be used as a sole basis for treatment. Nasal washings and aspirates are unacceptable for Xpert Xpress SARS-CoV-2/FLU/RSV testing.  Fact Sheet for Patients: EntrepreneurPulse.com.au  Fact Sheet for Healthcare Providers: IncredibleEmployment.be  This test is not yet approved or cleared by the [place] FDA and has been authorized for detection and/or diagnosis of SARS-CoV-2 by FDA under an Emergency Use Authorization (EUA). This EUA will remain in effect (meaning this test can be used) for the duration of the COVID-19 declaration under Section 564(b)(1) of the Act, 21 U.S.C. section 360bbb-3(b)(1), unless the authorization is terminated or revoked.  Performed at [facility], [address] [place], [place] [address]      Radiology Studies: No results found.  Scheduled Meds:  atorvastatin  40 mg Oral QHS   B-complex with vitamin C  1 tablet Oral Daily   busPIRone  10 mg Oral BID   docusate sodium  100 mg Oral BID   dolutegravir  50 mg Oral QAC breakfast   And   rilpivirine  25 mg Oral Q breakfast   enoxaparin (LOVENOX) injection  40 mg Subcutaneous Q24H   escitalopram  30 mg Oral Daily   feeding supplement  237 mL Oral BID BM   folic acid  1 mg Oral Daily   lisinopril  2.5 mg Oral Daily   LORazepam  0-4 mg Intravenous Q12H   Or   LORazepam  0-4 mg Oral Q12H   multivitamin with minerals  1 tablet Oral Daily   thiamine  100 mg  Oral Daily   Or   thiamine  100 mg Intravenous Daily   Continuous Infusions:  methocarbamol (ROBAXIN) IV  LOS: 3 days    Enzo Bi, MD [facility]  If 7PM-7AM, please contact night-coverage [email or web address]  10/04/2020, 4:39 PM

## 2020-10-04 NOTE — TOC Progression Note (Addendum)
Transition of Care (TOC) - Progression Note    Patient Details  Name: Anthony Olson. MRN: [id number] Date of Birth: 23-Apr-1945  Transition of Care [facility]) CM/SW Contact  Su Hilt, RN Phone Number: 10/04/2020, 9:23 AM  Clinical Narrative:   Reached out to the facilities requesting a bed offer sent request to all facilities in the Hub, awaiting offers to review for choice         Expected Discharge Plan and Services                                                 Social Determinants of Health (SDOH) Interventions    Readmission Risk Interventions No flowsheet data found.

## 2020-10-04 NOTE — Progress Notes (Signed)
Dr Billie Ruddy notified of patient falling. No apparent injuries noted. Per MD continue to monitor pt. No new orders received

## 2020-10-05 DIAGNOSIS — S72401A Unspecified fracture of lower end of right femur, initial encounter for closed fracture: Secondary | ICD-10-CM | POA: Diagnosis not present

## 2020-10-05 LAB — BASIC METABOLIC PANEL
Anion gap: 7 (ref 5–15)
BUN: 17 mg/dL (ref 8–23)
CO2: 27 mmol/L (ref 22–32)
Calcium: 8.9 mg/dL (ref 8.9–10.3)
Chloride: 100 mmol/L (ref 98–111)
Creatinine, Ser: 1.46 mg/dL — ABNORMAL HIGH (ref 0.61–1.24)
GFR, Estimated: 51 mL/min — ABNORMAL LOW (ref >60)
Glucose, Bld: 92 mg/dL (ref 70–99)
Potassium: 3.8 mmol/L (ref 3.5–5.1)
Sodium: 134 mmol/L — ABNORMAL LOW (ref 135–145)

## 2020-10-05 LAB — MAGNESIUM: Magnesium: 2.3 mg/dL (ref 1.7–2.4)

## 2020-10-05 LAB — CBC
HCT: 36 % — ABNORMAL LOW (ref 39.0–52.0)
Hemoglobin: 12.7 g/dL — ABNORMAL LOW (ref 13.0–17.0)
MCH: 36 pg — ABNORMAL HIGH (ref 26.0–34.0)
MCHC: 35.3 g/dL (ref 30.0–36.0)
MCV: 102 fL — ABNORMAL HIGH (ref 80.0–100.0)
Platelets: 121 10*3/uL — ABNORMAL LOW (ref 150–400)
RBC: 3.53 MIL/uL — ABNORMAL LOW (ref 4.22–5.81)
RDW: 12.6 % (ref 11.5–15.5)
WBC: 7.7 10*3/uL (ref 4.0–10.5)
nRBC: 0 % (ref 0.0–0.2)

## 2020-10-05 MED ORDER — QUETIAPINE FUMARATE 25 MG PO TABS
50.0000 mg | ORAL_TABLET | Freq: Every day | ORAL | Status: DC
  Administered 2020-10-05: 50 mg via ORAL
  Filled 2020-10-05: qty 2

## 2020-10-05 NOTE — Progress Notes (Signed)
PROGRESS NOTE    Tennis Must.  [id number] DOB: 1945-05-27 DOA: 10/01/2020 PCP: Theotis Burrow, MD   Brief Narrative: Taken from H&P.  Anthony Cuen. is a 75 y.o. male with medical history significant of HIV, essential hypertension, anxiety/depression, EtOH/nicotine dependence, CKD stage IIIa, recent diagnosis cellulitis of scrotum who presented to [facility] ED on 7/31 following fall at home.  Patient reports was at a birthday party yesterday where he drank some tequila and when he returned home he did not recall etiology of his fall to the ground.  Unknown amount of time on the ground.  Lives alone.  He woke up with pain to his right knee with associated swelling.   Imaging concerning for nondisplaced femoral condyle fracture. Labs pertinent for mildly elevated CK which has been normalized.  EtOH level elevated at 62. Orthopedic was consulted and they are recommending conservative management without any surgical intervention with pain management, immobilization and nonweightbearing right lower extremity. PT is recommending SNF-TOC is working on it  Subjective: Pt was noted to be confused and agitated overnight.  During the day, pt was no longer confused, and pt wondered how he got so confused about where he was.  Pt was worried about his unpaid bills today.  Complained of pain in his left hip as well, which made it hard for him to bear weight.   Assessment & Plan:   Principal Problem:   Femur fracture, right (HCC) Active Problems:   H/O total knee replacement   Human immunodeficiency virus (HIV) infection (HCC)   Alcohol use disorder, severe, dependence ([place])   Alcohol abuse   Fall  Right femur fracture, nondisplaced Patient presenting to ED after fall at home for unclear etiology, suspect due to acute alcohol intoxication.  Orthopedic is recommending nonsurgical, conservative management and he will remain with knee immobilizer and nonweightbearing on right lower  extremity and will follow-up with them as an outpatient.  Vitamin D level within normal limit --PT/OT -Continue with pain management --fall precautions.  Delirium --pt became more confused on day 3 of hospitalization.   --increase seroquel to 50 mg nightly --avoid sitter since pt is pending discharge to SNF  Mild rhabdomyolysis.  Most likely secondary to lying on floor for unknown time.  CK has been normalized.    Acute EtOH intoxication/History of EtOH dependence Patient reports was at a birthday party, drinking tequila.  Typically drinks roughly a sixpack of beer daily.  Discussed need for complete cessation given his multiple recurrent falls.  Patient also amenable for EtOH rehab.  Denies any history of significant withdrawal symptoms or seizures. -Continue with CIWA protocol.  No need of Ativan so far. --cont thiamine   Recent admission for scrotal cellulitis Recently discharged on 7/28 with scrotal cellulitis, received IV Zosyn while inpatient and transition to Augmentin to complete course on 10/03/2020.   Depression/anxiety --Lexapro 30 mg p.o. daily and BuSpar 10 mg p.o. twice daily --cont home regimen   Essential hypertension --cont Lisinopril   History of HIV --cont home anti-virals   HLD:  --cont home statin  Nicotine dependence: Continues to smoke 1 pack/day.  Counseled on need for cessation.  Declines nicotine patch.  Objective: Vitals:   10/05/20 0350 10/05/20 0817 10/05/20 1157 10/05/20 1541  BP: 115/78 128/65 111/71 115/69  Pulse: 77 72 75 75  Resp: '18 16 15 16  '$ Temp: 98.3 F (36.8 C) 98 F (36.7 C) 98.2 F (36.8 C) 98.7 F (37.1 C)  TempSrc:  SpO2: 98% 94% 96% 99%  Weight:      Height:        Intake/Output Summary (Last 24 hours) at 10/05/2020 1743 Last data filed at 10/05/2020 [id number] Gross per 24 hour  Intake --  Output 800 ml  Net -800 ml   Filed Weights   10/01/20 0846  Weight: 83 kg    Examination:  Constitutional: NAD, alert,  oriented to person and place HEENT: conjunctivae and lids normal, EOMI CV: No cyanosis.   RESP: normal respiratory effort, on RA Extremities: No effusions, edema in BLE SKIN: warm, dry Neuro: II - XII grossly intact.   Psych: Normal mood and affect.     DVT prophylaxis: Lovenox Code Status: DNR Family Communication:  Disposition Plan:  Status is: Inpatient  Remains inpatient appropriate because:Inpatient level of care appropriate due to severity of illness  Dispo: The patient is from: Home              Anticipated d/c is to: SNF              Patient currently is medically stable to d/c.   Difficult to place patient No              Level of care: Med-Surg  All the records are reviewed and case discussed with Care Management/Social Worker. Management plans discussed with the patient, nursing and they are in agreement.  Consultants:  Orthopedic surgery  Procedures:  Antimicrobials:   Data Reviewed: I have personally reviewed following labs and imaging studies  CBC: Recent Labs  Lab 10/01/20 0902 10/02/20 0417 10/05/20 0425  WBC 10.4 6.6 7.7  NEUTROABS 7.7  --   --   HGB 13.9 12.9* 12.7*  HCT 39.2 36.6* 36.0*  MCV 101.0* 102.2* 102.0*  PLT 123* 130* 123XX123*   Basic Metabolic Panel: Recent Labs  Lab 10/01/20 0902 10/02/20 0417 10/05/20 0425  NA 137 132* 134*  K 3.7 3.7 3.8  CL 103 103 100  CO2 20* 23 27  GLUCOSE 81 92 92  BUN '14 17 17  '$ CREATININE 1.50* 1.33* 1.46*  CALCIUM 8.5* 7.8* 8.9  MG  --  2.1 2.3  PHOS  --  3.1  --    GFR: Estimated Creatinine Clearance: 47.2 mL/min (A) (by C-G formula based on SCr of 1.46 mg/dL (H)). Liver Function Tests: Recent Labs  Lab 10/01/20 0902 10/02/20 0417  AST 37 31  ALT 18 15  ALKPHOS 84 82  BILITOT 1.2 1.8*  PROT 6.5 5.7*  ALBUMIN 3.5 3.1*   No results for input(s): LIPASE, AMYLASE in the last 168 hours. No results for input(s): AMMONIA in the last 168 hours. Coagulation Profile: No results for input(s):  INR, PROTIME in the last 168 hours. Cardiac Enzymes: Recent Labs  Lab 10/01/20 0902 10/02/20 0417  CKTOTAL 784* 322   BNP (last 3 results) No results for input(s): PROBNP in the last 8760 hours. HbA1C: No results for input(s): HGBA1C in the last 72 hours. CBG: No results for input(s): GLUCAP in the last 168 hours. Lipid Profile: No results for input(s): CHOL, HDL, LDLCALC, TRIG, CHOLHDL, LDLDIRECT in the last 72 hours. Thyroid Function Tests: No results for input(s): TSH, T4TOTAL, FREET4, T3FREE, THYROIDAB in the last 72 hours. Anemia Panel: No results for input(s): VITAMINB12, FOLATE, FERRITIN, TIBC, IRON, RETICCTPCT in the last 72 hours. Sepsis Labs: No results for input(s): PROCALCITON, LATICACIDVEN in the last 168 hours.   Recent Results (from the past 240 hour(s))  Culture, blood (Routine  X 2) w Reflex to ID Panel     Status: None   Collection Time: 09/26/20 11:35 AM   Specimen: BLOOD  Result Value Ref Range Status   Specimen Description BLOOD  LEFT FOREARM  Final   Special Requests   Final    BOTTLES DRAWN AEROBIC AND ANAEROBIC Blood Culture adequate volume   Culture   Final    NO GROWTH 7 DAYS Performed at [facility], [address], [place], [place] [address]    Report Status 10/03/2020 FINAL  Final  Culture, blood (Routine X 2) w Reflex to ID Panel     Status: None   Collection Time: 09/26/20 11:45 AM   Specimen: BLOOD  Result Value Ref Range Status   Specimen Description BLOOD  LEFT AC  Final   Special Requests   Final    BOTTLES DRAWN AEROBIC AND ANAEROBIC Blood Culture adequate volume   Culture   Final    NO GROWTH 7 DAYS Performed at [facility], [address] [place], [place] [address]    Report Status 10/03/2020 FINAL  Final  Resp Panel by RT-PCR (Flu A&B, Covid) Nasopharyngeal Swab     Status: None   Collection Time: 09/26/20  2:16 PM   Specimen: Nasopharyngeal Swab; Nasopharyngeal(NP) swabs in vial transport medium  Result  Value Ref Range Status   SARS Coronavirus 2 by RT PCR NEGATIVE NEGATIVE Final    Comment: (NOTE) SARS-CoV-2 target nucleic acids are NOT DETECTED.  The SARS-CoV-2 RNA is generally detectable in upper respiratory specimens during the acute phase of infection. The lowest concentration of SARS-CoV-2 viral copies this assay can detect is 138 copies/mL. A negative result does not preclude SARS-Cov-2 infection and should not be used as the sole basis for treatment or other patient management decisions. A negative result may occur with  improper specimen collection/handling, submission of specimen other than nasopharyngeal swab, presence of viral mutation(s) within the areas targeted by this assay, and inadequate number of viral copies(<138 copies/mL). A negative result must be combined with clinical observations, patient history, and epidemiological information. The expected result is Negative.  Fact Sheet for Patients:  EntrepreneurPulse.com.au  Fact Sheet for Healthcare Providers:  IncredibleEmployment.be  This test is no t yet approved or cleared by the [place] FDA and  has been authorized for detection and/or diagnosis of SARS-CoV-2 by FDA under an Emergency Use Authorization (EUA). This EUA will remain  in effect (meaning this test can be used) for the duration of the COVID-19 declaration under Section 564(b)(1) of the Act, 21 U.S.C.section 360bbb-3(b)(1), unless the authorization is terminated  or revoked sooner.       Influenza A by PCR NEGATIVE NEGATIVE Final   Influenza B by PCR NEGATIVE NEGATIVE Final    Comment: (NOTE) The Xpert Xpress SARS-CoV-2/FLU/RSV plus assay is intended as an aid in the diagnosis of influenza from Nasopharyngeal swab specimens and should not be used as a sole basis for treatment. Nasal washings and aspirates are unacceptable for Xpert Xpress SARS-CoV-2/FLU/RSV testing.  Fact Sheet for  Patients: EntrepreneurPulse.com.au  Fact Sheet for Healthcare Providers: IncredibleEmployment.be  This test is not yet approved or cleared by the [place] FDA and has been authorized for detection and/or diagnosis of SARS-CoV-2 by FDA under an Emergency Use Authorization (EUA). This EUA will remain in effect (meaning this test can be used) for the duration of the COVID-19 declaration under Section 564(b)(1) of the Act, 21 U.S.C. section 360bbb-3(b)(1), unless the authorization is terminated  or revoked.  Performed at [facility], [place], [place] [address]   Resp Panel by RT-PCR (Flu A&B, Covid) Nasopharyngeal Swab     Status: None   Collection Time: 10/01/20 12:25 PM   Specimen: Nasopharyngeal Swab; Nasopharyngeal(NP) swabs in vial transport medium  Result Value Ref Range Status   SARS Coronavirus 2 by RT PCR NEGATIVE NEGATIVE Final    Comment: (NOTE) SARS-CoV-2 target nucleic acids are NOT DETECTED.  The SARS-CoV-2 RNA is generally detectable in upper respiratory specimens during the acute phase of infection. The lowest concentration of SARS-CoV-2 viral copies this assay can detect is 138 copies/mL. A negative result does not preclude SARS-Cov-2 infection and should not be used as the sole basis for treatment or other patient management decisions. A negative result may occur with  improper specimen collection/handling, submission of specimen other than nasopharyngeal swab, presence of viral mutation(s) within the areas targeted by this assay, and inadequate number of viral copies(<138 copies/mL). A negative result must be combined with clinical observations, patient history, and epidemiological information. The expected result is Negative.  Fact Sheet for Patients:  EntrepreneurPulse.com.au  Fact Sheet for Healthcare Providers:  IncredibleEmployment.be  This test is no t  yet approved or cleared by the [place] FDA and  has been authorized for detection and/or diagnosis of SARS-CoV-2 by FDA under an Emergency Use Authorization (EUA). This EUA will remain  in effect (meaning this test can be used) for the duration of the COVID-19 declaration under Section 564(b)(1) of the Act, 21 U.S.C.section 360bbb-3(b)(1), unless the authorization is terminated  or revoked sooner.       Influenza A by PCR NEGATIVE NEGATIVE Final   Influenza B by PCR NEGATIVE NEGATIVE Final    Comment: (NOTE) The Xpert Xpress SARS-CoV-2/FLU/RSV plus assay is intended as an aid in the diagnosis of influenza from Nasopharyngeal swab specimens and should not be used as a sole basis for treatment. Nasal washings and aspirates are unacceptable for Xpert Xpress SARS-CoV-2/FLU/RSV testing.  Fact Sheet for Patients: EntrepreneurPulse.com.au  Fact Sheet for Healthcare Providers: IncredibleEmployment.be  This test is not yet approved or cleared by the [place] FDA and has been authorized for detection and/or diagnosis of SARS-CoV-2 by FDA under an Emergency Use Authorization (EUA). This EUA will remain in effect (meaning this test can be used) for the duration of the COVID-19 declaration under Section 564(b)(1) of the Act, 21 U.S.C. section 360bbb-3(b)(1), unless the authorization is terminated or revoked.  Performed at [facility], [address], [place], [place] [address]      Radiology Studies: No results found.  Scheduled Meds:  atorvastatin  40 mg Oral QHS   B-complex with vitamin C  1 tablet Oral Daily   busPIRone  10 mg Oral BID   docusate sodium  100 mg Oral BID   dolutegravir  50 mg Oral QAC breakfast   And   rilpivirine  25 mg Oral Q breakfast   enoxaparin (LOVENOX) injection  40 mg Subcutaneous Q24H   escitalopram  30 mg Oral Daily   feeding supplement  237 mL Oral BID BM   folic acid  1 mg Oral Daily    lisinopril  2.5 mg Oral Daily   LORazepam  0-4 mg Intravenous Q12H   Or   LORazepam  0-4 mg Oral Q12H   multivitamin with minerals  1 tablet Oral Daily   thiamine  100 mg Oral Daily   Or   thiamine  100 mg  Intravenous Daily   Continuous Infusions:  methocarbamol (ROBAXIN) IV       LOS: 4 days    Enzo Bi, MD [facility]  If 7PM-7AM, please contact night-coverage [email or web address]  10/05/2020, 5:43 PM

## 2020-10-05 NOTE — TOC Progression Note (Signed)
Transition of Care (TOC) - Progression Note    Patient Details  Name: Anthony Olson. MRN: [id number] Date of Birth: 29-Mar-1945  Transition of Care [facility]) CM/SW Contact  Su Hilt, RN Phone Number: 10/05/2020, 4:00 PM  Clinical Narrative:     Checked the status of the insurance authorization, it is pending still, will continue to check       Expected Discharge Plan and Services                                                 Social Determinants of Health (SDOH) Interventions    Readmission Risk Interventions No flowsheet data found.

## 2020-10-05 NOTE — TOC Progression Note (Addendum)
Transition of Care (TOC) - Progression Note    Patient Details  Name: Anthony Olson. MRN: [id number] Date of Birth: September 21, 1945  Transition of Care [facility]) CM/SW Contact  Su Hilt, RN Phone Number: 10/05/2020, 10:46 AM  Clinical Narrative:   was unable to locate the patient in the [employer] portal, [employer] never returned call yesterday, I called 2 times and was on hold more than 45 min each time left a message with a request to call back, I reached out again this morning, still unable to locate the patient on the portal, I called [facility] again and was on hold for more than 45 min and left a message requesting a call back         Expected Discharge Plan and Services                                                 Social Determinants of Health (SDOH) Interventions    Readmission Risk Interventions No flowsheet data found.

## 2020-10-05 NOTE — Progress Notes (Signed)
Occupational Therapy Treatment Patient Details Name: Anthony Olson. MRN: [id number] DOB: [birth year] Today's Date: 10/05/2020    History of present illness Anthony Olson. is a 50yoM who [facility]20 after sustaining a fall, unknown time down, awoke to Rt knee swelling, pain, inability to bear weight. PMH: HIV, HTN, GAD/depression, ETOH, nicotine dependence, CKD3a, bilat TKA, recent scrotal cellulitis. Imaging revealing of Rt nondisplaced femur fracture extending into the lateral condyle. Orthopedics recommending conservative management with knee immobilizer, NWB. CK levels inititally elevated. Of note pt did have a fall yesterday  8/3. Per nsg note no new orders.   OT comments  Chart reviewed, pt greeted semi supine in bed, agreeable to OT tx session. 1:1 present. Pt is oriented to self, place, situation, and year/month. Requires cueing for date. Pt with appropriate recall of WBing precautions, however poor adherence and carry over throughout session. ADLs are completed at edge of bed due to pt attempting to WB on RLE while in standing.Pt performs SPT to bedside chair  with RW, again attempting to WB on RLE despite tactile and verbal cueing. During tx session, pt is able to appropriately sequence all ADL tasks but is presenting with apparent STM deficits, will continue to assess. Pt is left in bedside chair, +chair alarm, +1:1, NAD all needs met. OT continues to recommend discharge to SNF to address functional deficits. OT will continue to follow while admitted.     Follow Up Recommendations  SNF    Equipment Recommendations  3 in 1 bedside commode    Recommendations for Other Services      Precautions / Restrictions Precautions Precautions: Fall Required Braces or Orthoses: Knee Immobilizer - Right Restrictions Weight Bearing Restrictions: Yes RLE Weight Bearing: Non weight bearing       Mobility Bed Mobility Overal bed mobility: Needs Assistance Bed Mobility: Supine to  Sit;Sit to Supine     Supine to sit: Min guard;HOB elevated Sit to supine: Min assist        Transfers Overall transfer level: Needs assistance Equipment used: Rolling walker (2 wheeled) Transfers: Sit to/from [facility] Sit to Stand: Min guard Stand pivot transfers: Min assist       General transfer comment: frequent verbal cues required throughout to maintain NWBing status    Balance Overall balance assessment: History of Falls Sitting-balance support: No upper extremity supported;Feet supported Sitting balance-Leahy Scale: Good     Standing balance support: Bilateral upper extremity supported;During functional activity Standing balance-Leahy Scale: Poor Standing balance comment: Significanat cueing for adherence to RLE NWBing                           ADL either performed or assessed with clinical judgement   ADL Overall ADL's : Needs assistance/impaired     Grooming: Wash/dry hands;Wash/dry face;Sitting;Set up Grooming Details (indicate cue type and reason): at edge of bed; no cueing required for ADL sequencing; attempted in standing and pt unable to maintain WBing precautions Upper Body Bathing: Minimal assistance;Sitting Upper Body Bathing Details (indicate cue type and reason): for back Lower Body Bathing: Sit to/from stand;Moderate assistance   Upper Body Dressing : Supervision/safety;Sitting   Lower Body Dressing: Maximal assistance;Sitting/lateral leans               Functional mobility during ADLs: Minimal assistance       Vision Baseline Vision/History: Wears glasses Wears Glasses: At all times Patient Visual Report: No change from baseline  Cognition Arousal/Alertness: Awake/alert Behavior During Therapy: WFL for tasks assessed/performed Overall Cognitive Status: Impaired/Different from baseline Area of Impairment: Memory;Orientation                     Memory: Decreased recall of  precautions;Decreased short-term memory         General Comments: poor recall of recent events                          Pertinent Vitals/ Pain       Pain Assessment: 0-10 Pain Score: 7  Pain Location: R leg Pain Descriptors / Indicators: Spasm;Tightness Pain Intervention(s): Limited activity within patient's tolerance;Monitored during session   Frequency  Min 1X/week        Progress Toward Goals  OT Goals(current goals can now be found in the care plan section)  Progress towards OT goals: Progressing toward goals  Acute Rehab OT Goals Patient Stated Goal: get stronger OT Goal Formulation: With patient Time For Goal Achievement: 10/19/20 Potential to Achieve Goals: Good  Plan Discharge plan remains appropriate;Frequency remains appropriate                    AM-PAC OT "6 Clicks" Daily Activity     Outcome Measure   Help from another person eating meals?: None Help from another person taking care of personal grooming?: None Help from another person toileting, which includes using toliet, bedpan, or urinal?: A Lot Help from another person bathing (including washing, rinsing, drying)?: A Little Help from another person to put on and taking off regular upper body clothing?: None Help from another person to put on and taking off regular lower body clothing?: A Little 6 Click Score: 20    End of Session Equipment Utilized During Treatment: Gait belt;Rolling walker  OT Visit Diagnosis: Other abnormalities of gait and mobility (R26.89);Repeated falls (R29.6);Pain   Activity Tolerance Patient tolerated treatment well   Patient Left in chair;with call bell/phone within reach;with chair alarm set;with nursing/sitter in room   Nurse Communication Mobility status;Weight bearing status        Time: 0102-7253 OT Time Calculation (min): 33 min  Charges: OT General Charges $OT Visit: 1 Visit OT Treatments $Self Care/Home Management : 8-22  mins $Therapeutic Activity: 8-22 mins  Anthony Olson, OTD OTR/L  10/05/20, 12:48 PM

## 2020-10-05 NOTE — Care Management Important Message (Signed)
Important Message  Patient Details  Name: Anthony Olson. MRN: [id number] Date of Birth: Feb 20, 1946   Medicare Important Message Given:  Yes     Juliann Pulse A Tomie Spizzirri 10/05/2020, 10:12 AM

## 2020-10-05 NOTE — Progress Notes (Signed)
Patient alert to self and place, disoriented to time and situation. Patient was calm and cooperative at the beginning of shift, but has become extremely confused, impulsive, and combative with nursing staff during the night. MD notified PRN medication ordered and administered, 1:1 sitter at bedside. Safety and relaxation techniques in place. Will continue to monitor.

## 2020-10-05 NOTE — Progress Notes (Signed)
Physical Therapy Treatment Patient Details Name: Anthony Olson. MRN: [id number] DOB: 1945-11-24 Today's Date: 10/05/2020    History of Present Illness Anthony Olson. is a 54yoM who comes to [facility] on 10/01/20 after sustaining a fall, unknown time down, awoke to Rt knee swelling, pain, inability to bear weight. PMH: HIV, HTN, GAD/depression, ETOH, nicotine dependence, CKD3a, bilat TKA, recent scrotal cellulitis. Imaging revealing of Rt nondisplaced femur fracture extending into the lateral condyle. Orthopedics recommending conservative management with knee immobilizer, NWB. CK levels inititally elevated. Of note pt did have a fall yesterday  8/3. Per nsg note no new orders.    PT Comments    Pt in recliner upon entry, asking about help to go back to bed. Unclear if pt recognizes author from prior sessions. Author tries to obtain pt's perspective of fall yesterday, pt only able to provide some detail, but ultimately his story is confounded by his acute disorientation yesterday. Pt oriented again to course of care and location, seems 50% improved since previous day. Pt reporting new Left knee and left hip pain since fall, denies any progression in RLE pain. Author able to visualize a quarter dollar sized dark red bruise inferolateral the left patella, no gross joint edema seen. Author sees no left hip skin injury nor bruising, no lateral tenderness, but (+) tenderness to posterior hip musculature. Pt able to SLR in supine with soreness, but most pain is generated by LLE weightbearing which makes RLE NWB exteremly difficult this date. KI is removed, all metal straightened, excess foam trimmed, refitted. Pt has improved TKA ROM immediately, tolerated well. Pt left in bed at end of session, alarm on, all needs met, HOB at 20 degrees.     Follow Up Recommendations  SNF;Supervision for mobility/OOB     Equipment Recommendations  None recommended by PT    Recommendations for Other Services        Precautions / Restrictions Precautions Precautions: Fall Required Braces or Orthoses: Knee Immobilizer - Right Knee Immobilizer - Right: On at all times;On except when in CPM Restrictions Weight Bearing Restrictions: Yes RLE Weight Bearing: Non weight bearing    Mobility  Bed Mobility Overal bed mobility: Needs Assistance Bed Mobility: Sit to Supine     Supine to sit: Min guard;HOB elevated Sit to supine: Min assist   General bed mobility comments: author helps with legs    Transfers Overall transfer level: Needs assistance Equipment used: Rolling walker (2 wheeled) Transfers: Sit to/from Stand Sit to Stand: Mod assist Stand pivot transfers: Min assist       General transfer comment: Pain in LLE much more limiting now since he fell yesterday  Ambulation/Gait Ambulation/Gait assistance:  (deferred due to new issues with pain adn ability to maintain weightbearing)               Stairs             Wheelchair Mobility    Modified Rankin (Stroke Patients Only)       Balance Overall balance assessment: History of Falls Sitting-balance support: No upper extremity supported;Feet supported Sitting balance-Leahy Scale: Good     Standing balance support: Bilateral upper extremity supported;During functional activity Standing balance-Leahy Scale: Poor Standing balance comment: Significanat cueing for adherence to RLE NWBing                            Cognition Arousal/Alertness: Awake/alert Behavior During Therapy: [facility] for tasks assessed/performed Overall Cognitive  Status: Impaired/Different from baseline Area of Impairment: Memory;Orientation                     Memory: Decreased recall of precautions;Decreased short-term memory         General Comments: requires orientation to place for yesterday but is able to recount some detail regarding his fall yesterday, but still confused      Exercises General Exercises - Lower  Extremity Heel Slides: AROM;Left;10 reps;Supine Hip ABduction/ADduction: AAROM;Right;10 reps;Supine;Other (comment);Limitations Hip Abduction/Adduction Limitations: 5x on Left A/ROM Straight Leg Raises: Left;AROM;5 reps;Supine    General Comments        Pertinent Vitals/Pain Pain Assessment: 0-10 Pain Score: 7  Faces Pain Scale: Hurts whole lot Pain Location: left hip in weightbearing Pain Descriptors / Indicators: Spasm;Tightness Pain Intervention(s): Limited activity within patient's tolerance;Monitored during session;Premedicated before session    Home Living                      Prior Function            PT Goals (current goals can now be found in the care plan section) Acute Rehab PT Goals Patient Stated Goal: get stronger PT Goal Formulation: With patient Time For Goal Achievement: 10/16/20 Potential to Achieve Goals: Good Progress towards PT goals: Not progressing toward goals - comment    Frequency    7X/week      PT Plan Current plan remains appropriate    Co-evaluation              AM-PAC PT "6 Clicks" Mobility   Outcome Measure  Help needed turning from your back to your side while in a flat bed without using bedrails?: A Lot Help needed moving from lying on your back to sitting on the side of a flat bed without using bedrails?: A Lot Help needed moving to and from a bed to a chair (including a wheelchair)?: A Lot Help needed standing up from a chair using your arms (e.g., wheelchair or bedside chair)?: A Lot Help needed to walk in hospital room?: Total Help needed climbing 3-5 steps with a railing? : Total 6 Click Score: 10    End of Session Equipment Utilized During Treatment: Gait belt Activity Tolerance: Patient tolerated treatment well;Patient limited by pain Patient left: in bed;with bed alarm set Nurse Communication: Mobility status PT Visit Diagnosis: Other abnormalities of gait and mobility (R26.89);Repeated falls  (R29.6);Muscle weakness (generalized) (M62.81);History of falling (Z91.81);Difficulty in walking, not elsewhere classified (R26.2)     Time: 3474-2595 PT Time Calculation (min) (ACUTE ONLY): 28 min  Charges:  $Therapeutic Exercise: 8-22 mins $Therapeutic Activity: 8-22 mins                    1:45 PM, 10/05/20 Etta Grandchild, PT, DPT Physical Therapist - [facility]  [phone] ([place])     Madison C 10/05/2020, 1:37 PM

## 2020-10-05 NOTE — TOC Progression Note (Signed)
Transition of Care (TOC) - Progression Note    Patient Details  Name: Anthony Olson. MRN: [id number] Date of Birth: 11-Sep-1945  Transition of Care [facility]) CM/SW Contact  Su Hilt, RN Phone Number: 10/05/2020, 1:51 PM  Clinical Narrative:     Finally was able to get in touch with [facility] after multiple phone calls and long hold times, Uploaded clinical documents to go to [facility] in [place], ref number [phone]      Expected Discharge Plan and Services                                                 Social Determinants of Health (SDOH) Interventions    Readmission Risk Interventions No flowsheet data found.

## 2020-10-06 DIAGNOSIS — S72401A Unspecified fracture of lower end of right femur, initial encounter for closed fracture: Secondary | ICD-10-CM | POA: Diagnosis not present

## 2020-10-06 LAB — MAGNESIUM: Magnesium: 2.4 mg/dL (ref 1.7–2.4)

## 2020-10-06 LAB — BASIC METABOLIC PANEL
Anion gap: 5 (ref 5–15)
BUN: 24 mg/dL — ABNORMAL HIGH (ref 8–23)
CO2: 27 mmol/L (ref 22–32)
Calcium: 8.8 mg/dL — ABNORMAL LOW (ref 8.9–10.3)
Chloride: 103 mmol/L (ref 98–111)
Creatinine, Ser: 1.67 mg/dL — ABNORMAL HIGH (ref 0.61–1.24)
GFR, Estimated: 43 mL/min — ABNORMAL LOW (ref >60)
Glucose, Bld: 107 mg/dL — ABNORMAL HIGH (ref 70–99)
Potassium: 3.9 mmol/L (ref 3.5–5.1)
Sodium: 135 mmol/L (ref 135–145)

## 2020-10-06 LAB — RESP PANEL BY RT-PCR (FLU A&B, COVID) ARPGX2
Influenza A by PCR: NEGATIVE
Influenza B by PCR: NEGATIVE
SARS Coronavirus 2 by RT PCR: NEGATIVE

## 2020-10-06 LAB — CBC
HCT: 36.4 % — ABNORMAL LOW (ref 39.0–52.0)
Hemoglobin: 12.7 g/dL — ABNORMAL LOW (ref 13.0–17.0)
MCH: 35.4 pg — ABNORMAL HIGH (ref 26.0–34.0)
MCHC: 34.9 g/dL (ref 30.0–36.0)
MCV: 101.4 fL — ABNORMAL HIGH (ref 80.0–100.0)
Platelets: 131 K/uL — ABNORMAL LOW (ref 150–400)
RBC: 3.59 MIL/uL — ABNORMAL LOW (ref 4.22–5.81)
RDW: 12.5 % (ref 11.5–15.5)
WBC: 7.5 K/uL (ref 4.0–10.5)
nRBC: 0 % (ref 0.0–0.2)

## 2020-10-06 MED ORDER — DOCUSATE SODIUM 100 MG PO CAPS: 100.0000 mg | ORAL_CAPSULE | Freq: Two times a day (BID) | ORAL | 0 refills | Status: AC

## 2020-10-06 MED ORDER — THIAMINE HCL 100 MG PO TABS: 100.0000 mg | ORAL_TABLET | Freq: Every day | ORAL | Status: AC

## 2020-10-06 MED ORDER — HYDROCODONE-ACETAMINOPHEN 5-325 MG PO TABS: 1.0000 | ORAL_TABLET | Freq: Four times a day (QID) | ORAL | 0 refills | Status: AC | PRN

## 2020-10-06 MED ORDER — POLYETHYLENE GLYCOL 3350 17 G PO PACK: 17.0000 g | PACK | Freq: Every day | ORAL | 0 refills | Status: AC

## 2020-10-06 MED ORDER — ENSURE ENLIVE PO LIQD: 237.0000 mL | Freq: Two times a day (BID) | ORAL | 12 refills | Status: AC

## 2020-10-06 MED ORDER — METHOCARBAMOL 500 MG PO TABS: 500.0000 mg | ORAL_TABLET | Freq: Four times a day (QID) | ORAL | Status: AC | PRN

## 2020-10-06 MED ORDER — FOLIC ACID 1 MG PO TABS: 1.0000 mg | ORAL_TABLET | Freq: Every day | ORAL | Status: AC

## 2020-10-06 MED ORDER — QUETIAPINE FUMARATE 50 MG PO TABS: 50.0000 mg | ORAL_TABLET | Freq: Every day | ORAL | 0 refills | Status: AC

## 2020-10-06 MED ORDER — ADULT MULTIVITAMIN W/MINERALS CH: 1.0000 | ORAL_TABLET | Freq: Every day | ORAL | Status: AC

## 2020-10-06 MED ORDER — POLYETHYLENE GLYCOL 3350 17 G PO PACK
34.0000 g | PACK | Freq: Two times a day (BID) | ORAL | Status: DC
  Administered 2020-10-06: 34 g via ORAL
  Filled 2020-10-06: qty 2

## 2020-10-06 NOTE — Discharge Summary (Signed)
Physician Discharge Summary   Anthony Olson  male DOB: November 17, 1945  [id number]  PCP: Theotis Burrow, MD  Admit date: 10/01/2020 Discharge date: 10/06/2020  Admitted From: home Disposition:  SNF CODE STATUS: DNR   Hospital Course:  For full details, please see H&P, progress notes, consult notes and ancillary notes.  Briefly,  Anthony Olson. is a 75 y.o. male with medical history significant of HIV, essential hypertension, anxiety/depression, EtOH/nicotine dependence, CKD stage IIIa, recent diagnosis cellulitis of scrotum who presented to [facility] ED on 7/31 following fall at home.    Patient reported he was at a birthday party the day prior where he drank some tequila and when he returned home he did not recall etiology of his fall to the ground.  Unknown amount of time on the ground.  Lives alone.  He woke up with pain to his right knee with associated swelling.   Imaging concerning for nondisplaced femoral condyle fracture. Labs pertinent for mildly elevated CK which has been normalized.  EtOH level elevated at 62.  Right femur fracture, nondisplaced Patient presenting to ED after fall at home for unclear etiology, suspect due to acute alcohol intoxication.  Orthopedic is recommending nonsurgical, conservative management and he will remain with knee immobilizer and nonweightbearing on right lower extremity and will follow-up with them as an outpatient.  Vitamin D level within normal limit. --non-weightbearing on the right lower extremity --PT/OT rec SNF rehab --Norco PRN for pain --fall precautions.   Delirium --pt became more confused on day 3 of hospitalization.  Started on seroquel nightly and mental status improved prior to discharge.   --cont seroquel 50 mg nightly for 7 days after discharge.   Mild rhabdomyolysis.   Most likely secondary to lying on floor for unknown time.  CK has normalized.     Acute EtOH intoxication History of EtOH dependence Patient  reports was at a birthday party, drinking tequila.  Typically drinks roughly a six-pack of beer daily.  Discussed need for complete cessation given his multiple recurrent falls.   --was monitored on CIWA  --cont thiamine and folic acid   Recent admission for scrotal cellulitis Recently discharged on 7/28 with scrotal cellulitis, received IV Zosyn while inpatient and transitioned to Augmentin to complete course on 10/03/2020.   Depression/anxiety --Lexapro 30 mg p.o. daily and BuSpar 10 mg p.o. twice daily --cont home regimen   Essential hypertension --cont Lisinopril   History of HIV --cont home anti-virals   HLD: --cont home statin   Nicotine dependence: Continues to smoke 1 pack/day.  Counseled on need for cessation.  Declines nicotine patch.   Discharge Diagnoses:  Principal Problem:   Femur fracture, right ([place]) Active Problems:   H/O total knee replacement   Human immunodeficiency virus (HIV) infection ([place])   Alcohol use disorder, severe, dependence ([place])   Alcohol abuse   Fall   30 Day Unplanned Readmission Risk Score    Flowsheet Row ED to Hosp-Admission (Current) from 10/01/2020 in [place] (1A)  30 Day Unplanned Readmission Risk Score (%) 30.11 Filed at 10/06/2020 0801       This score is the patient's risk of an unplanned readmission within 30 days of being discharged (0 -100%). The score is based on dignosis, age, lab data, medications, orders, and past utilization.   Low:  0-14.9   Medium: 15-21.9   High: 22-29.9   Extreme: 30 and above         Discharge Instructions:  Allergies as of 10/06/2020       Reactions   Nicotine    Nicotine patches (mild reaction)        Medication List     STOP taking these medications    amoxicillin-clavulanate 875-125 MG tablet Commonly known as: Augmentin   KRILL OIL PO       TAKE these medications    atorvastatin 40 MG tablet Commonly known as: LIPITOR Take 40 mg  by mouth at bedtime.   b complex vitamins tablet Take 1 tablet by mouth daily.   busPIRone 10 MG tablet Commonly known as: BUSPAR Take 10 mg by mouth 2 (two) times daily.   docusate sodium 100 MG capsule Commonly known as: COLACE Take 1 capsule (100 mg total) by mouth 2 (two) times daily.   Dolutegravir-Rilpivirine 50-25 MG Tabs Take 1 tablet by mouth daily.   escitalopram 20 MG tablet Commonly known as: LEXAPRO Take 30 mg by mouth daily. Takes one 10 mg and 20 mg tablets a day.   feeding supplement Liqd Take 237 mLs by mouth 2 (two) times daily between meals.   folic acid 1 MG tablet Commonly known as: FOLVITE Take 1 tablet (1 mg total) by mouth daily. Start taking on: October 07, 2020   HYDROcodone-acetaminophen 5-325 MG tablet Commonly known as: NORCO/VICODIN Take 1 tablet by mouth every 6 (six) hours as needed for up to 5 days for moderate pain.   lisinopril 2.5 MG tablet Commonly known as: ZESTRIL Take 2.5 mg by mouth daily.   methocarbamol 500 MG tablet Commonly known as: ROBAXIN Take 1 tablet (500 mg total) by mouth every 6 (six) hours as needed for muscle spasms.   multivitamin with minerals Tabs tablet Take 1 tablet by mouth daily. Start taking on: October 07, 2020   Omega-3 1000 MG Caps Take 1 capsule by mouth daily.   polyethylene glycol 17 g packet Commonly known as: MIRALAX / GLYCOLAX Take 17 g by mouth daily.   ProAir HFA 108 (90 Base) MCG/ACT inhaler Generic drug: albuterol Inhale 1-2 puffs into the lungs every 6 (six) hours as needed for wheezing or shortness of breath.   QUEtiapine 50 MG tablet Commonly known as: SEROQUEL Take 1 tablet (50 mg total) by mouth at bedtime for 7 days.   thiamine 100 MG tablet Take 1 tablet (100 mg total) by mouth daily. Start taking on: October 07, 2020         Contact information for follow-up providers     Revelo, Elyse Jarvis, MD Follow up in 1 week(s).   Specialty: Family Medicine Contact  information: [address] [place] [address] [phone]         Kate Sable, MD .   Specialties: Cardiology, Radiology Contact information: [place] [address] [phone]              Contact information for after-discharge care     [facility] .   Service: Skilled Nursing Contact information: [place] [address] [phone]                     Allergies  Allergen Reactions   Nicotine     Nicotine patches (mild reaction)     The results of significant diagnostics from this hospitalization (including imaging, microbiology, ancillary and laboratory) are listed below for reference.   Consultations:   Procedures/Studies: CT Head Wo Contrast  Result  Date: 10/01/2020 CLINICAL DATA:  75 year old male status post fall last night. Pain and swelling. EXAM: CT HEAD WITHOUT CONTRAST TECHNIQUE: Contiguous axial images were obtained from the base of the skull through the vertex without intravenous contrast. COMPARISON:  Head CT 10/14/2019. FINDINGS: Brain: Stable non contrast CT appearance of the brain. No midline shift, ventriculomegaly, mass effect, evidence of mass lesion, intracranial hemorrhage or evidence of cortically based acute infarction. Mild for age scattered white matter hypodensity. Vascular: Calcified atherosclerosis at the skull base. Chronic intracranial artery tortuosity. No suspicious intracranial vascular hyperdensity. Skull: Stable.  No acute osseous abnormality identified. Sinuses/Orbits: Visualized paranasal sinuses and mastoids are stable and well aerated. Other: Visualized orbits and scalp soft tissues are within normal limits. IMPRESSION: No acute intracranial abnormality or acute traumatic injury identified. Electronically Signed   By: Genevie Ann M.D.   On: 10/01/2020 10:05   CT Cervical Spine Wo Contrast  Result Date:  10/01/2020 CLINICAL DATA:  75 year old male status post fall last night. Pain and swelling. EXAM: CT CERVICAL SPINE WITHOUT CONTRAST TECHNIQUE: Multidetector CT imaging of the cervical spine was performed without intravenous contrast. Multiplanar CT image reconstructions were also generated. COMPARISON:  Head CT today reported separately. Cervical spine CT 09/18/2017. FINDINGS: Alignment: Stable straightening of cervical lordosis since 2019. Cervicothoracic junction alignment is within normal limits. Stable bilateral posterior element alignment. Skull base and vertebrae: Osteopenia. Visualized skull base is intact. No atlanto-occipital dissociation. C1 and C2 appear intact and aligned. No acute osseous abnormality identified. Soft tissues and spinal canal: No prevertebral fluid or swelling. No visible canal hematoma. Stable visible noncontrast neck soft tissues. Partially retropharyngeal course of the right carotid. Disc levels: Chronic severe cervical spine degeneration appears stable since 2019, including partially calcified ligamentous hypertrophy about the odontoid. Upper chest: Stable visible upper thoracic levels. Negative lung apices. IMPRESSION: 1. No acute traumatic injury identified in the cervical spine. 2. Chronic severe cervical spine degeneration appears stable since 2019. Electronically Signed   By: Genevie Ann M.D.   On: 10/01/2020 10:08   CT PELVIS WO CONTRAST  Result Date: 10/01/2020 CLINICAL DATA:  Status post fall last night. Pelvic pain. Initial encounter. EXAM: CT OF THE BILATERAL HIPS WITHOUT CONTRAST TECHNIQUE: Multidetector CT imaging of the bilateral hips was performed according to the standard protocol. Multiplanar CT image reconstructions were also generated. COMPARISON:  Plain films pelvis and right hip earlier today. FINDINGS: Bones/Joint/Cartilage Right hip arthroplasty is in place. No hardware complication is identified. Bones appear osteopenic. No fracture or focal lesion is  identified. 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 Ligaments Suboptimally assessed by CT. Muscles and Tendons Appear normal. Soft tissues Fat containing right inguinal hernia is noted. Sigmoid diverticulosis is seen. Aortic atherosclerosis also noted. The patient is status post lower lumbar surgery. IMPRESSION: No acute abnormality. Osteopenia. Right inguinal hernia. Diverticulosis. Aortic Atherosclerosis (ICD10-I70.0). Electronically Signed   By: Inge Rise M.D.   On: 10/01/2020 11:30   CT Knee Right Wo Contrast  Result Date: 10/01/2020 CLINICAL DATA:  Right knee pain after a fall last night. History of prior knee replacement. EXAM: CT OF THE RIGHT KNEE WITHOUT CONTRAST TECHNIQUE: Multidetector CT imaging of the right knee was performed according to the standard protocol. Multiplanar CT image reconstructions were also generated. COMPARISON:  Plain films right knee today. FINDINGS: Bones/Joint/Cartilage A total knee arthroplasty is in place and results in streak artifact on the exam. The patient has a lipohemarthrosis. No displaced fracture is identified. Subtle foci of cortical irregularity seen  along the lateral metaphysis of the femur and the lateral femoral condyle. No hardware complication related to the patient's arthroplasty is seen. Bones appear osteopenic. Ligaments Suboptimally assessed by CT. Muscles and Tendons Appear intact. Soft tissues No fluid collection or mass. IMPRESSION: Lipohemarthrosis is consistent with the presence of a fracture but no displaced fracture is seen. Subtle cortical irregularity and lucency along the lateral metaphysis of the distal femur and lateral femoral condyle are worrisome for nondisplaced fractures. Right knee arthroplasty in place.  No hardware complication. Osteopenia. Electronically Signed   By: Inge Rise M.D.   On: 10/01/2020 11:16   DG Knee Complete 4 Views Right  Result Date: 10/01/2020 CLINICAL DATA:  Pain after fall EXAM: RIGHT  KNEE - COMPLETE 4+ VIEW COMPARISON:  None. FINDINGS: The patient is status post knee replacement. Hardware is in good position. No fractures are identified. There is a large suprapatellar joint effusion with an associated fat fluid level consistent with a lipohemarthrosis. No other acute abnormalities. IMPRESSION: Lipohemarthrosis in the suprapatellar region suggesting a possible underlying occult fracture even though no fracture is seen on this study. CT imaging of the knee could help to evaluate for an underlying occult fracture. Electronically Signed   By: Dorise Bullion III M.D   On: 10/01/2020 09:58   US SCROTUM W/DOPPLER  Result Date: 09/26/2020 CLINICAL DATA:  Testicular pain, question abscess possibly on the right. Bilateral testicular pain, redness. EXAM: SCROTAL ULTRASOUND DOPPLER ULTRASOUND OF THE TESTICLES TECHNIQUE: Complete ultrasound examination of the testicles, epididymis, and other scrotal structures was performed. Color and spectral Doppler ultrasound were also utilized to evaluate blood flow to the testicles. COMPARISON:  None. FINDINGS: Right testicle Measurements: 3.6 x 3.0 x 3.6 cm. No mass or microlithiasis visualized. Left testicle Measurements: 3.8 x 3.0 x 3.0 cm. No mass or microlithiasis visualized. Right epididymis:  8 mm epididymal head cyst. Left epididymis:  2 mm epididymal head cyst. Hydrocele:  None visualized. Varicocele:  None visualized. Pulsed Doppler interrogation of both testes demonstrates normal low resistance arterial and venous waveforms bilaterally. IMPRESSION: No testicular abnormality.  No evidence of torsion or infection. Electronically Signed   By: Rolm Baptise M.D.   On: 09/26/2020 12:01   DG Hip Unilat W or Wo Pelvis 2-3 Views Right  Result Date: 10/01/2020 CLINICAL DATA:  Chronic right hip and knee pain, worse after a fall today. EXAM: DG HIP (WITH OR WITHOUT PELVIS) 2-3V RIGHT COMPARISON:  None. FINDINGS: The patient is status post right hip replacement.  Hardware is in good position. No fracture or dislocation. Degenerative changes in the lumbar spine. IMPRESSION: Right hip replacement as above. Hardware is in good position. No cause for right hip pain noted. Electronically Signed   By: Dorise Bullion III M.D   On: 10/01/2020 09:51   DG Femur Min 2 Views Right  Result Date: 10/01/2020 CLINICAL DATA:  Pain after fall. EXAM: RIGHT FEMUR 2 VIEWS COMPARISON:  None. FINDINGS: The patient is status post right knee and hip replacement. There is a large suprapatellar joint effusion with an associated fat fluid level. No convincing evidence of fracture. IMPRESSION: 1. There is a large suprapatellar joint effusion in the knee with an associated fat fluid level. This finding suggests the possibility of an underlying occult fracture. No fracture seen on today's imaging. A CT scan of the knee could help to evaluate for an underlying occult fracture resulting in the lipohemarthrosis in the suprapatellar region. Electronically Signed   By: Dorise Bullion III M.D  On: 10/01/2020 09:57      Labs: BNP (last 3 results) Recent Labs    10/14/19 1847  BNP 123456   Basic Metabolic Panel: Recent Labs  Lab 10/01/20 0902 10/02/20 0417 10/05/20 0425 10/06/20 0448  NA 137 132* 134* 135  K 3.7 3.7 3.8 3.9  CL 103 103 100 103  CO2 20* '23 27 27  '$ GLUCOSE 81 92 92 107*  BUN '14 17 17 '$ 24*  CREATININE 1.50* 1.33* 1.46* 1.67*  CALCIUM 8.5* 7.8* 8.9 8.8*  MG  --  2.1 2.3 2.4  PHOS  --  3.1  --   --    Liver Function Tests: Recent Labs  Lab 10/01/20 0902 10/02/20 0417  AST 37 31  ALT 18 15  ALKPHOS 84 82  BILITOT 1.2 1.8*  PROT 6.5 5.7*  ALBUMIN 3.5 3.1*   No results for input(s): LIPASE, AMYLASE in the last 168 hours. No results for input(s): AMMONIA in the last 168 hours. CBC: Recent Labs  Lab 10/01/20 0902 10/02/20 0417 10/05/20 0425 10/06/20 0448  WBC 10.4 6.6 7.7 7.5  NEUTROABS 7.7  --   --   --   HGB 13.9 12.9* 12.7* 12.7*  HCT 39.2 36.6*  36.0* 36.4*  MCV 101.0* 102.2* 102.0* 101.4*  PLT 123* 130* 121* 131*   Cardiac Enzymes: Recent Labs  Lab 10/01/20 0902 10/02/20 0417  CKTOTAL 784* 322   BNP: Invalid input(s): POCBNP CBG: No results for input(s): GLUCAP in the last 168 hours. D-Dimer No results for input(s): DDIMER in the last 72 hours. Hgb A1c No results for input(s): HGBA1C in the last 72 hours. Lipid Profile No results for input(s): CHOL, HDL, LDLCALC, TRIG, CHOLHDL, LDLDIRECT in the last 72 hours. Thyroid function studies No results for input(s): TSH, T4TOTAL, T3FREE, THYROIDAB in the last 72 hours.  Invalid input(s): FREET3 Anemia work up No results for input(s): VITAMINB12, FOLATE, FERRITIN, TIBC, IRON, RETICCTPCT in the last 72 hours. Urinalysis    Component Value Date/Time   COLORURINE YELLOW (A) 10/01/2020 0902   APPEARANCEUR CLEAR (A) 10/01/2020 0902   LABSPEC 1.012 10/01/2020 0902   PHURINE 7.0 10/01/2020 0902   GLUCOSEU 50 (A) 10/01/2020 0902   HGBUR SMALL (A) 10/01/2020 0902   BILIRUBINUR NEGATIVE 10/01/2020 0902   KETONESUR NEGATIVE 10/01/2020 0902   PROTEINUR 30 (A) 10/01/2020 0902   NITRITE NEGATIVE 10/01/2020 0902   LEUKOCYTESUR NEGATIVE 10/01/2020 0902   Sepsis Labs Invalid input(s): PROCALCITONIN,  WBC,  LACTICIDVEN Microbiology Recent Results (from the past 240 hour(s))  Culture, blood (Routine X 2) w Reflex to ID Panel     Status: None   Collection Time: 09/26/20 11:35 AM   Specimen: BLOOD  Result Value Ref Range Status   Specimen Description BLOOD  LEFT FOREARM  Final   Special Requests   Final    BOTTLES DRAWN AEROBIC AND ANAEROBIC Blood Culture adequate volume   Culture   Final    NO GROWTH 7 DAYS Performed at [facility], [address], [place], [place] [address]    Report Status 10/03/2020 FINAL  Final  Culture, blood (Routine X 2) w Reflex to ID Panel     Status: None   Collection Time: 09/26/20 11:45 AM   Specimen: BLOOD  Result Value Ref Range  Status   Specimen Description BLOOD  LEFT [facility]  Final   Special Requests   Final    BOTTLES DRAWN AEROBIC AND ANAEROBIC Blood Culture adequate volume   Culture   Final  NO GROWTH 7 DAYS Performed at [facility], [address] [place], [place] [address]    Report Status 10/03/2020 FINAL  Final  Resp Panel by RT-PCR (Flu A&B, Covid) Nasopharyngeal Swab     Status: None   Collection Time: 09/26/20  2:16 PM   Specimen: Nasopharyngeal Swab; Nasopharyngeal(NP) swabs in vial transport medium  Result Value Ref Range Status   SARS Coronavirus 2 by RT PCR NEGATIVE NEGATIVE Final    Comment: (NOTE) SARS-CoV-2 target nucleic acids are NOT DETECTED.  The SARS-CoV-2 RNA is generally detectable in upper respiratory specimens during the acute phase of infection. The lowest concentration of SARS-CoV-2 viral copies this assay can detect is 138 copies/mL. A negative result does not preclude SARS-Cov-2 infection and should not be used as the sole basis for treatment or other patient management decisions. A negative result may occur with  improper specimen collection/handling, submission of specimen other than nasopharyngeal swab, presence of viral mutation(s) within the areas targeted by this assay, and inadequate number of viral copies(<138 copies/mL). A negative result must be combined with clinical observations, patient history, and epidemiological information. The expected result is Negative.  Fact Sheet for Patients:  EntrepreneurPulse.com.au  Fact Sheet for Healthcare Providers:  IncredibleEmployment.be  This test is no t yet approved or cleared by the [place] FDA and  has been authorized for detection and/or diagnosis of SARS-CoV-2 by FDA under an Emergency Use Authorization (EUA). This EUA will remain  in effect (meaning this test can be used) for the duration of the COVID-19 declaration under Section 564(b)(1) of the Act,  21 U.S.C.section 360bbb-3(b)(1), unless the authorization is terminated  or revoked sooner.       Influenza A by PCR NEGATIVE NEGATIVE Final   Influenza B by PCR NEGATIVE NEGATIVE Final    Comment: (NOTE) The Xpert Xpress SARS-CoV-2/FLU/RSV plus assay is intended as an aid in the diagnosis of influenza from Nasopharyngeal swab specimens and should not be used as a sole basis for treatment. Nasal washings and aspirates are unacceptable for Xpert Xpress SARS-CoV-2/FLU/RSV testing.  Fact Sheet for Patients: EntrepreneurPulse.com.au  Fact Sheet for Healthcare Providers: IncredibleEmployment.be  This test is not yet approved or cleared by the [place] FDA and has been authorized for detection and/or diagnosis of SARS-CoV-2 by FDA under an Emergency Use Authorization (EUA). This EUA will remain in effect (meaning this test can be used) for the duration of the COVID-19 declaration under Section 564(b)(1) of the Act, 21 U.S.C. section 360bbb-3(b)(1), unless the authorization is terminated or revoked.  Performed at [facility], [place], [place] [address]   Resp Panel by RT-PCR (Flu A&B, Covid) Nasopharyngeal Swab     Status: None   Collection Time: 10/01/20 12:25 PM   Specimen: Nasopharyngeal Swab; Nasopharyngeal(NP) swabs in vial transport medium  Result Value Ref Range Status   SARS Coronavirus 2 by RT PCR NEGATIVE NEGATIVE Final    Comment: (NOTE) SARS-CoV-2 target nucleic acids are NOT DETECTED.  The SARS-CoV-2 RNA is generally detectable in upper respiratory specimens during the acute phase of infection. The lowest concentration of SARS-CoV-2 viral copies this assay can detect is 138 copies/mL. A negative result does not preclude SARS-Cov-2 infection and should not be used as the sole basis for treatment or other patient management decisions. A negative result may occur with  improper specimen  collection/handling, submission of specimen other than nasopharyngeal swab, presence of viral mutation(s) within the areas targeted by this assay, and inadequate number  of viral copies(<138 copies/mL). A negative result must be combined with clinical observations, patient history, and epidemiological information. The expected result is Negative.  Fact Sheet for Patients:  EntrepreneurPulse.com.au  Fact Sheet for Healthcare Providers:  IncredibleEmployment.be  This test is no t yet approved or cleared by the [place] FDA and  has been authorized for detection and/or diagnosis of SARS-CoV-2 by FDA under an Emergency Use Authorization (EUA). This EUA will remain  in effect (meaning this test can be used) for the duration of the COVID-19 declaration under Section 564(b)(1) of the Act, 21 U.S.C.section 360bbb-3(b)(1), unless the authorization is terminated  or revoked sooner.       Influenza A by PCR NEGATIVE NEGATIVE Final   Influenza B by PCR NEGATIVE NEGATIVE Final    Comment: (NOTE) The Xpert Xpress SARS-CoV-2/FLU/RSV plus assay is intended as an aid in the diagnosis of influenza from Nasopharyngeal swab specimens and should not be used as a sole basis for treatment. Nasal washings and aspirates are unacceptable for Xpert Xpress SARS-CoV-2/FLU/RSV testing.  Fact Sheet for Patients: EntrepreneurPulse.com.au  Fact Sheet for Healthcare Providers: IncredibleEmployment.be  This test is not yet approved or cleared by the [place] FDA and has been authorized for detection and/or diagnosis of SARS-CoV-2 by FDA under an Emergency Use Authorization (EUA). This EUA will remain in effect (meaning this test can be used) for the duration of the COVID-19 declaration under Section 564(b)(1) of the Act, 21 U.S.C. section 360bbb-3(b)(1), unless the authorization is terminated or revoked.  Performed at [facility], [address] [place], [place] [address]      Total time spend on discharging this patient, including the last patient exam, discussing the hospital stay, instructions for ongoing care as it relates to all pertinent caregivers, as well as preparing the medical discharge records, prescriptions, and/or referrals as applicable, is 35 minutes.    Enzo Bi, MD  [facility] 10/06/2020, 9:03 AM

## 2020-10-06 NOTE — Progress Notes (Signed)
VSS, IV cath removed site was c/d/I. Attempted to call report to [facility] the facility where pt is going unsuccessfully each time. Pt transported via EMS to facility with all belongings to go to facility.

## 2020-10-06 NOTE — TOC Progression Note (Signed)
Transition of Care (TOC) - Progression Note    Patient Details  Name: Anthony Olson. MRN: [id number] Date of Birth: 1945-07-15  Transition of Care [facility]) CM/SW Contact  Su Hilt, RN Phone Number: 10/06/2020, 2:07 PM  Clinical Narrative:    Contacted the patient's friend Anthony Olson, he is agreeable to taking the patient's HIV meds to the facility [facility] where the patient will be going to bed B22 bed 2, the nurse is calling report, [facility] to pick up at 430        Expected Discharge Plan and Services           Expected Discharge Date: 10/06/20                                     Social Determinants of Health (SDOH) Interventions    Readmission Risk Interventions No flowsheet data found.

## 2020-10-06 NOTE — Progress Notes (Signed)
Physical Therapy Treatment Patient Details Name: Anthony Olson. MRN: [id number] DOB: [birth year] Today's Date: 10/06/2020    History of Present Illness Anthony Olson. is a 39yoM who comes [facility] on 10/01/20 after sustaining a fall, unknown time down, awoke to Rt knee swelling, pain, inability to bear weight. PMH: HIV, HTN, GAD/depression, ETOH, nicotine dependence, CKD3a, bilat TKA, recent scrotal cellulitis. Imaging revealing of Rt nondisplaced femur fracture extending into the lateral condyle. Orthopedics recommending conservative management with knee immobilizer, NWB. CK levels inititally elevated. Of note pt did have a fall yesterday  8/3. Per nsg note no new orders.    PT Comments    Pt in bed upon entry, conversational, mentation appears much improved since previous day. Pt reports improved pain in LLE. RLE pain also improved. KI still in place, fitted appropriately. Pt partakes in bed level leg strengthening, minimal assist needed. Most pain with Left single leg bridges, but is able to perform 75% ROM 10x. WIll continue to follow. Pt left in bed due to drowsiness and pending DC.   Follow Up Recommendations  SNF;Supervision for mobility/OOB     Equipment Recommendations  None recommended by PT    Recommendations for Other Services       Precautions / Restrictions Precautions Precautions: Fall Required Braces or Orthoses: Knee Immobilizer - Right Knee Immobilizer - Right: On at all times;On except when in CPM Restrictions Weight Bearing Restrictions: Yes RLE Weight Bearing: Non weight bearing    Mobility  Bed Mobility               General bed mobility comments: deferred, pt sleepy/drowsy, just back to bed recently    Transfers                    Ambulation/Gait                 Stairs             Wheelchair Mobility    Modified Rankin (Stroke Patients Only)       Balance                                             Cognition Arousal/Alertness: Awake/alert Behavior During Therapy: WFL for tasks assessed/performed Overall Cognitive Status: Within Functional Limits for tasks assessed                                 General Comments: much closer to baseline mentation seen 2 and 3 days prior.      Exercises Other Exercises Other Exercises: Supine  RLE: abduction x15, SLR c minA x15 Other Exercises: Supiine LLE: SAQ, HS, ABDCT/Add, SL bridge x10 (all others x15)    General Comments        Pertinent Vitals/Pain Pain Assessment: 0-10 Faces Pain Scale: Hurts whole lot Pain Location: left hip in weightbearing Pain Descriptors / Indicators: Spasm;Tightness Pain Intervention(s): Limited activity within patient's tolerance;Monitored during session    Home Living                      Prior Function            PT Goals (current goals can now be found in the care plan section) Acute Rehab PT Goals Patient Stated Goal: get stronger PT  Goal Formulation: With patient Time For Goal Achievement: 10/16/20 Potential to Achieve Goals: Good Progress towards PT goals: Progressing toward goals    Frequency    7X/week      PT Plan      Co-evaluation              AM-PAC PT "6 Clicks" Mobility   Outcome Measure  Help needed turning from your back to your side while in a flat bed without using bedrails?: A Lot Help needed moving from lying on your back to sitting on the side of a flat bed without using bedrails?: A Lot Help needed moving to and from a bed to a chair (including a wheelchair)?: A Lot Help needed standing up from a chair using your arms (e.g., wheelchair or bedside chair)?: A Lot Help needed to walk in hospital room?: Total Help needed climbing 3-5 steps with a railing? : Total 6 Click Score: 10    End of Session   Activity Tolerance: Patient tolerated treatment well;No increased pain Patient left: in bed;with bed alarm set;with call bell/phone  within reach Nurse Communication: Mobility status PT Visit Diagnosis: Other abnormalities of gait and mobility (R26.89);Repeated falls (R29.6);Muscle weakness (generalized) (M62.81);History of falling (Z91.81);Difficulty in walking, not elsewhere classified (R26.2)     Time: [id number] PT Time Calculation (min) (ACUTE ONLY): 15 min  Charges:  $Therapeutic Exercise: 8-22 mins                    1:14 PM, 10/06/20 Etta Grandchild, PT, DPT Physical Therapist - [facility]  [phone] ([place])     [place] 10/06/2020, 1:04 PM

## 2020-10-06 NOTE — TOC Progression Note (Addendum)
Transition of Care (TOC) - Progression Note    Patient Details  Name: Anthony Olson. MRN: [id number] Date of Birth: [birth year]  Transition of Care [facility]) CM/SW Contact  Su Hilt, RN Phone Number: 10/06/2020, 8:41 AM  Clinical Narrative:     Received authorization to go to [facility] today Auth ref ID number [id number] Contacted Admissions at [facility] and notified that the patient would DC there today, Left a VM for a call back with a room number      Expected Discharge Plan and Services                                                 Social Determinants of Health (SDOH) Interventions    Readmission Risk Interventions No flowsheet data found.

## 2020-10-18 ENCOUNTER — Telehealth: Payer: Self-pay | Admitting: Orthopedic Surgery

## 2020-10-18 NOTE — Telephone Encounter (Signed)
Call from [place] ([facility] Garter) facility, [place], ph# [phone], per Delcie Roch, request for follow up appointment; states at their facility, discharged from [facility]. Per practice administrator Wendy's review, relayed to ask facility to call the orthopaedic consulting provider at [facility], [address]. States will call there.

## 2020-10-23 ENCOUNTER — Other Ambulatory Visit: Payer: Self-pay | Admitting: Orthopedic Surgery

## 2020-11-03 ENCOUNTER — Encounter: Payer: Self-pay | Admitting: Nurse Practitioner

## 2020-11-03 ENCOUNTER — Ambulatory Visit: Payer: Medicare Other | Admitting: Nurse Practitioner

## 2020-11-03 NOTE — Progress Notes (Deleted)
Office Visit    Patient Name: Anthony Olson. Date of Encounter: 11/03/2020  Primary Care Provider:  Theotis Burrow, MD Primary Cardiologist:  Anthony Sable, MD  Chief Complaint    75 year old male with a history of HIV, hypertension, hyperlipidemia, alcohol abuse, stage III chronic kidney disease, anxiety, depression, tobacco abuse, and syncope, who presents for follow-up after recent hospitalization for fall.  Past Medical History    Past Medical History:  Diagnosis Date   Alcohol abuse    self-reported   Anxiety    Hernia, abdominal    HIV infection (HCC)    Sees Dr. Ola Olson for this   HTN (hypertension)    Inguinal hernia    Kidney failure    Neoplasm of skin    Past Surgical History:  Procedure Laterality Date   CARPAL TUNNEL RELEASE Left    COLONOSCOPY WITH PROPOFOL N/A 01/20/2020   Procedure: COLONOSCOPY WITH PROPOFOL;  Surgeon: Anthony Lame, MD;  Location: [facility] ENDOSCOPY;  Service: Endoscopy;  Laterality: N/A;   HERNIA REPAIR  1997   Left Lower Abdomen- [place], [place]   HERNIA REPAIR  [id number]   [place]   INGUINAL HERNIA REPAIR Left 1996   [place]   INGUINAL HERNIA REPAIR Right 1993   [place]   JOINT REPLACEMENT     LAMINECTOMY     OPEN ANTERIOR SHOULDER RECONSTRUCTION Right 08/2013   [facility]   REPLACEMENT TOTAL KNEE Right    REPLACEMENT TOTAL KNEE Left    [facility]   UMBILICAL HERNIA REPAIR  1997   [place]    Allergies  Allergies  Allergen Reactions   Nicotine     Nicotine patches (mild reaction)    History of Present Illness    75 year old male with the above past medical history including HIV, hypertension, hyperlipidemia, alcohol and tobacco abuse, stage III chronic kidney disease, anxiety, depression, and syncope.  He was previously evaluated in September 2021 secondary to fall and syncope.  Event monitoring did not show any significant arrhythmias.  Prior echo in July 2019 showed normal LV function.  He  was last seen in cardiology clinic in October 2021, at which time he was doing well.  [name] was recently admitted to [facility] following fall with nondisplaced right femur fracture.  He was intoxicated at the time of the fall.  He was noted to have mild rhabdomyolysis and delirium during hospitalization.  He was seen by Ortho with recommendation for conservative management, knee immobilization, and nonweightbearing for the right lower extremity.  Home Medications    Current Outpatient Medications  Medication Sig Dispense Refill   atorvastatin (LIPITOR) 40 MG tablet Take 40 mg by mouth at bedtime.     b complex vitamins tablet Take 1 tablet by mouth daily.     busPIRone (BUSPAR) 10 MG tablet Take 10 mg by mouth 2 (two) times daily.     docusate sodium (COLACE) 100 MG capsule Take 1 capsule (100 mg total) by mouth 2 (two) times daily. 10 capsule 0   Dolutegravir-Rilpivirine 50-25 MG TABS Take 1 tablet by mouth daily.      escitalopram (LEXAPRO) 20 MG tablet Take 30 mg by mouth daily. Takes one 10 mg and 20 mg tablets a day.     feeding supplement (ENSURE ENLIVE / ENSURE PLUS) LIQD Take 237 mLs by mouth 2 (two) times daily between meals. [id number] mL 12   folic acid (FOLVITE) 1 MG tablet Take 1 tablet (1 mg total) by mouth  daily.     lisinopril (ZESTRIL) 2.5 MG tablet Take 2.5 mg by mouth daily.     methocarbamol (ROBAXIN) 500 MG tablet Take 1 tablet (500 mg total) by mouth every 6 (six) hours as needed for muscle spasms.     Multiple Vitamin (MULTIVITAMIN WITH MINERALS) TABS tablet Take 1 tablet by mouth daily.     Omega-3 1000 MG CAPS Take 1 capsule by mouth daily.     polyethylene glycol (MIRALAX / GLYCOLAX) 17 g packet Take 17 g by mouth daily.  0   PROAIR HFA 108 (90 Base) MCG/ACT inhaler Inhale 1-2 puffs into the lungs every 6 (six) hours as needed for wheezing or shortness of breath.     QUEtiapine (SEROQUEL) 50 MG tablet Take 1 tablet (50 mg total) by mouth at bedtime for 7 days. 7 tablet 0    thiamine 100 MG tablet Take 1 tablet (100 mg total) by mouth daily.     No current facility-administered medications for this visit.     Review of Systems    ***.  All other systems reviewed and are otherwise negative except as noted above.  Physical Exam    VS:  There were no vitals taken for this visit. , BMI There is no height or weight on file to calculate BMI.     GEN: Well nourished, well developed, in no acute distress. HEENT: normal. Neck: Supple, no JVD, carotid bruits, or masses. Cardiac: RRR, no murmurs, rubs, or gallops. No clubbing, cyanosis, edema.  Radials/DP/PT 2+ and equal bilaterally.  Respiratory:  Respirations regular and unlabored, clear to auscultation bilaterally. GI: Soft, nontender, nondistended, BS + x 4. MS: no deformity or atrophy. Skin: warm and dry, no rash. Neuro:  Strength and sensation are intact. Psych: Normal affect.  Accessory Clinical Findings    ECG personally reviewed by me today - *** - no acute changes.  Lab Results  Component Value Date   WBC 7.5 10/06/2020   HGB 12.7 (L) 10/06/2020   HCT 36.4 (L) 10/06/2020   MCV 101.4 (H) 10/06/2020   PLT 131 (L) 10/06/2020   Lab Results  Component Value Date   CREATININE 1.67 (H) 10/06/2020   BUN 24 (H) 10/06/2020   NA 135 10/06/2020   K 3.9 10/06/2020   CL 103 10/06/2020   CO2 27 10/06/2020   Lab Results  Component Value Date   ALT 15 10/02/2020   AST 31 10/02/2020   ALKPHOS 82 10/02/2020   BILITOT 1.8 (H) 10/02/2020   No results found for: CHOL, HDL, LDLCALC, LDLDIRECT, TRIG, CHOLHDL  No results found for: HGBA1C  Assessment & Plan    1.  ***   Anthony Hodgkins, NP 11/03/2020, 7:38 AM

## 2020-12-01 ENCOUNTER — Other Ambulatory Visit: Payer: Self-pay

## 2020-12-01 ENCOUNTER — Emergency Department
Admission: EM | Admit: 2020-12-01 | Discharge: 2020-12-01 | Disposition: A | Discharge status: Home / Self Care | Payer: Medicare Other | Attending: Emergency Medicine | Admitting: Emergency Medicine

## 2020-12-01 ENCOUNTER — Emergency Department: Payer: Medicare Other

## 2020-12-01 DIAGNOSIS — W01198A Fall on same level from slipping, tripping and stumbling with subsequent striking against other object, initial encounter: Secondary | ICD-10-CM | POA: Insufficient documentation

## 2020-12-01 DIAGNOSIS — G8929 Other chronic pain: Secondary | ICD-10-CM | POA: Insufficient documentation

## 2020-12-01 DIAGNOSIS — M25551 Pain in right hip: Secondary | ICD-10-CM | POA: Diagnosis not present

## 2020-12-01 DIAGNOSIS — Z5321 Procedure and treatment not carried out due to patient leaving prior to being seen by health care provider: Secondary | ICD-10-CM | POA: Diagnosis not present

## 2020-12-01 DIAGNOSIS — R531 Weakness: Secondary | ICD-10-CM | POA: Insufficient documentation

## 2020-12-01 DIAGNOSIS — M545 Low back pain, unspecified: Secondary | ICD-10-CM | POA: Insufficient documentation

## 2020-12-01 DIAGNOSIS — M25552 Pain in left hip: Secondary | ICD-10-CM | POA: Insufficient documentation

## 2020-12-01 LAB — CBC
HCT: 39.5 % (ref 39.0–52.0)
Hemoglobin: 13.8 g/dL (ref 13.0–17.0)
MCH: 36.1 pg — ABNORMAL HIGH (ref 26.0–34.0)
MCHC: 34.9 g/dL (ref 30.0–36.0)
MCV: 103.4 fL — ABNORMAL HIGH (ref 80.0–100.0)
Platelets: 153 10*3/uL (ref 150–400)
RBC: 3.82 MIL/uL — ABNORMAL LOW (ref 4.22–5.81)
RDW: 14.6 % (ref 11.5–15.5)
WBC: 7.3 10*3/uL (ref 4.0–10.5)
nRBC: 0 % (ref 0.0–0.2)

## 2020-12-01 LAB — BASIC METABOLIC PANEL
Anion gap: 9 (ref 5–15)
BUN: 16 mg/dL (ref 8–23)
CO2: 26 mmol/L (ref 22–32)
Calcium: 8.4 mg/dL — ABNORMAL LOW (ref 8.9–10.3)
Chloride: 101 mmol/L (ref 98–111)
Creatinine, Ser: 1.34 mg/dL — ABNORMAL HIGH (ref 0.61–1.24)
GFR, Estimated: 56 mL/min — ABNORMAL LOW (ref >60)
Glucose, Bld: 82 mg/dL (ref 70–99)
Potassium: 3.7 mmol/L (ref 3.5–5.1)
Sodium: 136 mmol/L (ref 135–145)

## 2020-12-01 NOTE — ED Triage Notes (Addendum)
Pt comes into the ED via EMS from home, states his legs gave out causing him to fall, hitting the back of his head, denies LOC, pt c/o lower back pain that he has chronically, has hx of BL hip fx. . Pt is a/ox4.. pt denies any other injury or new pain at this time  VSS 118/82

## 2020-12-01 NOTE — ED Provider Notes (Signed)
Emergency Medicine Provider Triage Evaluation Note  Anthony Must., a 75 y.o. male  was evaluated in triage.  Pt complains of mechanical fall after his legs "gave out." He hit his head but denies head or neck pain He c/o chronic LBP and bilateral hip pain. He denies LOC, N/V, CP or syncope related to the fall.   Review of Systems  Positive: LBP Negative: CP, SOB  Physical Exam  BP 96/69 (BP Location: Left Arm)   Pulse 67   Temp 97.9 F (36.6 C) (Oral)   Resp 16   SpO2 95%  Gen:   Awake, no distress  NAD Resp:  Normal effort CTA MSK:   Moves extremities without difficulty  Other:  CVS: RRR  Medical Decision Making  Medically screening exam initiated at 3:14 PM.  Appropriate orders placed.  Anthony Must. was informed that the remainder of the evaluation will be completed by another provider, this initial triage assessment does not replace that evaluation, and the importance of remaining in the ED until their evaluation is complete.  Patient with ED evaluation of LBP and bilateral hip pain    Jetaun Colbath, Dannielle Karvonen, PA-C 12/01/20 1518    Carrie Mew, MD 12/02/20 [phone]
